# Patient Record
Sex: Female | Born: 1980 | Race: White | Hispanic: No | Marital: Married | Smoking: Never smoker
Health system: Southern US, Community
[De-identification: ages and names within clinical notes are randomized; demographics above are authoritative.]

## PROBLEM LIST (undated history)

## (undated) DIAGNOSIS — R112 Nausea with vomiting, unspecified: Secondary | ICD-10-CM

## (undated) DIAGNOSIS — E669 Obesity, unspecified: Secondary | ICD-10-CM

## (undated) DIAGNOSIS — Z8489 Family history of other specified conditions: Secondary | ICD-10-CM

## (undated) DIAGNOSIS — E119 Type 2 diabetes mellitus without complications: Secondary | ICD-10-CM

## (undated) DIAGNOSIS — G5600 Carpal tunnel syndrome, unspecified upper limb: Secondary | ICD-10-CM

## (undated) DIAGNOSIS — R0600 Dyspnea, unspecified: Secondary | ICD-10-CM

## (undated) DIAGNOSIS — Z9889 Other specified postprocedural states: Secondary | ICD-10-CM

## (undated) DIAGNOSIS — T8859XA Other complications of anesthesia, initial encounter: Secondary | ICD-10-CM

## (undated) DIAGNOSIS — M549 Dorsalgia, unspecified: Secondary | ICD-10-CM

## (undated) DIAGNOSIS — I071 Rheumatic tricuspid insufficiency: Secondary | ICD-10-CM

## (undated) DIAGNOSIS — T4145XA Adverse effect of unspecified anesthetic, initial encounter: Secondary | ICD-10-CM

## (undated) HISTORY — PX: DILATION AND CURETTAGE OF UTERUS: SHX78

---

## 2000-02-12 ENCOUNTER — Emergency Department (HOSPITAL_COMMUNITY): Admission: EM | Admit: 2000-02-12 | Discharge: 2000-02-12 | Payer: Self-pay | Admitting: Emergency Medicine

## 2001-10-03 ENCOUNTER — Emergency Department (HOSPITAL_COMMUNITY): Admission: EM | Admit: 2001-10-03 | Discharge: 2001-10-04 | Payer: Self-pay | Admitting: *Deleted

## 2003-09-18 ENCOUNTER — Other Ambulatory Visit: Admission: RE | Admit: 2003-09-18 | Discharge: 2003-09-18 | Payer: Self-pay | Admitting: Family Medicine

## 2004-01-21 ENCOUNTER — Ambulatory Visit (HOSPITAL_COMMUNITY): Admission: RE | Admit: 2004-01-21 | Discharge: 2004-01-21 | Payer: Self-pay | Admitting: Family Medicine

## 2005-11-24 ENCOUNTER — Other Ambulatory Visit: Admission: RE | Admit: 2005-11-24 | Discharge: 2005-11-24 | Payer: Self-pay | Admitting: Family Medicine

## 2006-01-31 ENCOUNTER — Emergency Department (HOSPITAL_COMMUNITY): Admission: EM | Admit: 2006-01-31 | Discharge: 2006-01-31 | Payer: Self-pay | Admitting: Emergency Medicine

## 2007-01-22 ENCOUNTER — Other Ambulatory Visit: Admission: RE | Admit: 2007-01-22 | Discharge: 2007-01-22 | Payer: Self-pay | Admitting: Obstetrics and Gynecology

## 2007-03-08 ENCOUNTER — Ambulatory Visit (HOSPITAL_COMMUNITY): Admission: RE | Admit: 2007-03-08 | Discharge: 2007-03-08 | Payer: Self-pay | Admitting: Obstetrics and Gynecology

## 2007-03-08 ENCOUNTER — Encounter (INDEPENDENT_AMBULATORY_CARE_PROVIDER_SITE_OTHER): Payer: Self-pay | Admitting: Obstetrics and Gynecology

## 2007-09-17 ENCOUNTER — Encounter (INDEPENDENT_AMBULATORY_CARE_PROVIDER_SITE_OTHER): Payer: Self-pay | Admitting: Obstetrics and Gynecology

## 2007-09-17 ENCOUNTER — Ambulatory Visit (HOSPITAL_COMMUNITY): Admission: RE | Admit: 2007-09-17 | Discharge: 2007-09-17 | Payer: Self-pay | Admitting: Obstetrics and Gynecology

## 2008-01-25 ENCOUNTER — Other Ambulatory Visit: Admission: RE | Admit: 2008-01-25 | Discharge: 2008-01-25 | Payer: Self-pay | Admitting: Obstetrics and Gynecology

## 2008-02-11 ENCOUNTER — Other Ambulatory Visit: Admission: RE | Admit: 2008-02-11 | Discharge: 2008-02-11 | Payer: Self-pay | Admitting: Obstetrics and Gynecology

## 2008-03-27 ENCOUNTER — Ambulatory Visit (HOSPITAL_COMMUNITY): Admission: RE | Admit: 2008-03-27 | Discharge: 2008-03-27 | Payer: Self-pay | Admitting: Obstetrics and Gynecology

## 2008-03-27 ENCOUNTER — Encounter (INDEPENDENT_AMBULATORY_CARE_PROVIDER_SITE_OTHER): Payer: Self-pay | Admitting: Obstetrics and Gynecology

## 2008-04-05 ENCOUNTER — Emergency Department (HOSPITAL_COMMUNITY): Admission: EM | Admit: 2008-04-05 | Discharge: 2008-04-06 | Payer: Self-pay | Admitting: Emergency Medicine

## 2008-08-07 ENCOUNTER — Encounter (INDEPENDENT_AMBULATORY_CARE_PROVIDER_SITE_OTHER): Payer: Self-pay | Admitting: Obstetrics and Gynecology

## 2008-08-07 ENCOUNTER — Ambulatory Visit (HOSPITAL_COMMUNITY): Admission: RE | Admit: 2008-08-07 | Discharge: 2008-08-07 | Payer: Self-pay | Admitting: Obstetrics and Gynecology

## 2009-06-30 ENCOUNTER — Other Ambulatory Visit: Admission: RE | Admit: 2009-06-30 | Discharge: 2009-06-30 | Payer: Self-pay | Admitting: Obstetrics and Gynecology

## 2010-04-19 ENCOUNTER — Inpatient Hospital Stay (HOSPITAL_COMMUNITY)
Admission: AD | Admit: 2010-04-19 | Discharge: 2010-04-19 | Payer: Self-pay | Source: Home / Self Care | Attending: Obstetrics and Gynecology | Admitting: Obstetrics and Gynecology

## 2010-04-19 DIAGNOSIS — N390 Urinary tract infection, site not specified: Secondary | ICD-10-CM

## 2010-04-19 DIAGNOSIS — R197 Diarrhea, unspecified: Secondary | ICD-10-CM

## 2010-04-19 DIAGNOSIS — O2390 Unspecified genitourinary tract infection in pregnancy, unspecified trimester: Secondary | ICD-10-CM

## 2010-04-19 DIAGNOSIS — O210 Mild hyperemesis gravidarum: Secondary | ICD-10-CM

## 2010-07-20 LAB — COMPREHENSIVE METABOLIC PANEL
ALT: 19 U/L (ref 0–35)
Alkaline Phosphatase: 41 U/L (ref 39–117)
CO2: 24 mEq/L (ref 19–32)
Glucose, Bld: 92 mg/dL (ref 70–99)
Potassium: 4.1 mEq/L (ref 3.5–5.1)
Sodium: 134 mEq/L — ABNORMAL LOW (ref 135–145)
Total Protein: 7 g/dL (ref 6.0–8.3)

## 2010-07-20 LAB — URINE MICROSCOPIC-ADD ON

## 2010-07-20 LAB — URINE CULTURE

## 2010-07-20 LAB — DIFFERENTIAL
Basophils Relative: 0 % (ref 0–1)
Eosinophils Absolute: 0 10*3/uL (ref 0.0–0.7)
Eosinophils Relative: 0 % (ref 0–5)
Monocytes Relative: 3 % (ref 3–12)
Neutrophils Relative %: 85 % — ABNORMAL HIGH (ref 43–77)

## 2010-07-20 LAB — CBC
HCT: 37.5 % (ref 36.0–46.0)
Hemoglobin: 12.7 g/dL (ref 12.0–15.0)
WBC: 8.9 10*3/uL (ref 4.0–10.5)

## 2010-07-20 LAB — URINALYSIS, ROUTINE W REFLEX MICROSCOPIC
Bilirubin Urine: NEGATIVE
Glucose, UA: NEGATIVE mg/dL
Ketones, ur: NEGATIVE mg/dL
pH: 6 (ref 5.0–8.0)

## 2010-08-10 ENCOUNTER — Encounter: Payer: Federal, State, Local not specified - PPO | Attending: Obstetrics and Gynecology | Admitting: Dietician

## 2010-08-10 DIAGNOSIS — O99810 Abnormal glucose complicating pregnancy: Secondary | ICD-10-CM | POA: Insufficient documentation

## 2010-08-10 DIAGNOSIS — Z713 Dietary counseling and surveillance: Secondary | ICD-10-CM | POA: Insufficient documentation

## 2010-08-18 LAB — CBC
HCT: 41.6 % (ref 36.0–46.0)
Hemoglobin: 13.9 g/dL (ref 12.0–15.0)
RBC: 4.69 MIL/uL (ref 3.87–5.11)
RDW: 13 % (ref 11.5–15.5)
WBC: 10 10*3/uL (ref 4.0–10.5)

## 2010-09-21 NOTE — H&P (Signed)
NAMEEGYPT, [name]                ACCOUNT NO.:  [id number]   MEDICAL RECORD NO.:  [id number]          PATIENT TYPE:  AMB   LOCATION:  SDC                           [facility]  [facility]   PHYSICIAN:  Charles A. Delcambre, MDDATE OF BIRTH:  [birth year]   DATE OF ADMISSION:  DATE OF DISCHARGE:                              HISTORY & PHYSICAL   HISTORY:  The patient to be admitted on Sep 17, 2007.  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  She has been taking cyclic progesterone,  Provera 10 mg.  She accepts risks of uterine perforation, failure of  procedure, and fluid overload.  All questions were answered.  She will  be made n.p.o. past midnight and will be admitted for surgery on Sep 17 2007.   PAST MEDICAL HISTORY:  Depression, not on chronic medications.   MEDICATIONS:  None other than Provera recently.   ALLERGIES:  No known drug allergies.   SURGICAL HISTORY:  Hysteroscopy and D&C.   SOCIAL HISTORY:  No tobacco or abnormal drug use.  She is not sexually  active.  She is not in a relationship at this time.   FAMILY HISTORY:  Diabetes, stroke, breast cancer otherwise not  specified.  Hypertension is present.   REVIEW OF SYSTEMS:  No chest pain, shortness of breath, wheezing,  diarrhea, constipation, melena, pelvic pain, fever, chills or headache.   PHYSICAL EXAM:  GENERAL:  Alert and oriented x3.  Blood pressure 112/88,  heart rate 60, respirations 20, and afebrile.  HEART:  Regular rate and rhythm without murmurs, rubs or gallops.  LUNGS:  Clear bilaterally.  ABDOMEN: Grossly obese.  PELVIC EXAM:  Normal external female genitalia. Bartholin, urethra,  Skene within normal limit.  Vault without discharge or lesions.  Nulliparous cervix, no cervical motion tenderness.  Uterus is  nonenlarged otherwise difficult to palpate secondary to the  patient's  body habitus.  Ovaries are not palpated secondary to the patient's body  habitus.   ASSESSMENT:  Complex hyperplasia, now 6 months on progesterone therapy  with irregular bleeding still, to be admitted.   PLAN:  Admit and hysteroscopy and dilation and curettage once again.  Informed consent is given as noted above, n.p.o. past midnight, and we  will proceed as outlined.  Preoperative CBC and urine pregnancy test was  clear.      Charles A. Sydnee Cabal, MD  Electronically Signed     CAD/MEDQ  D:  09/14/2007  T:  09/15/2007  Job:  [id number]
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
## 2010-09-21 NOTE — Op Note (Signed)
[name], [name]                ACCOUNT NO.:  [id number]   MEDICAL RECORD NO.:  [id number]          PATIENT TYPE:  AMB   LOCATION:  SDC                           [facility]   PHYSICIAN:  Charles A. Delcambre, MDDATE OF BIRTH:  [birth year]   DATE OF PROCEDURE:  03/27/2008  DATE OF DISCHARGE:                               OPERATIVE REPORT   PREOPERATIVE DIAGNOSIS:  Endometrial hyperplasia, complex.   POSTOPERATIVE DIAGNOSIS:  Endometrial hyperplasia, complex.   PROCEDURES:  1. Hysteroscopy.  2. Dilation and curettage.  3. Paracervical block.   SURGEON:  Charles A. Delcambre, MD   ASSISTANT:  None.   ANESTHESIA:  General by laryngeal.   FINDINGS:  Shaggy appearing endometrium.  No obvious polyps.   SPECIMEN:  Endometrial curettings to pathology.   ESTIMATED BLOOD LOSS:  20 mL.   COMPLICATIONS:  None.   SORBITOL LOSS:  50 mL.   COUNTS:  Instrument, sponge, and needle count correct x2.   DESCRIPTION OF PROCEDURE:  The patient was taken to the operating room,  placed in supine position, given anesthetic.  She was then placed in  dorsal lithotomy position.  Sterile prep and drape was undertaken.  Tenaculum was placed on the anterior lip of the cervix.  Sound was to 8  cm.  Hanks dilators were used to dilate enough to pass a 5-mm scope.  Scope was placed.  Clear visualization was noted with findings as noted  above.  Scope was withdrawn and generalized curetting in a  circumferential manner was undertaken yielding moderate amount of  tissue.  There  was no evidence of perforation.  Paracervical block had been placed at 4  and 8 o'clock with 0.25% plain Marcaine 10 mL each site.  Prior to  dilation, she was awakened and taken to recovery with physician in  attendance having tolerated the procedure well.      Charles A. Sydnee Cabal, MD  Electronically Signed     CAD/MEDQ  D:  03/27/2008  T:  03/27/2008  Job:  [id number]

## 2010-09-21 NOTE — Op Note (Signed)
[name], [name]                ACCOUNT NO.:  [id number]   MEDICAL RECORD NO.:  [id number]          PATIENT TYPE:  AMB   LOCATION:  SDC                           [facility]   PHYSICIAN:  Charles A. Delcambre, MDDATE OF BIRTH:  October 08, 1980   DATE OF PROCEDURE:  08/07/2008  DATE OF DISCHARGE:                               OPERATIVE REPORT   PREOPERATIVE DIAGNOSIS:  Complex hyperplasia of the endometrium.   POSTOPERATIVE DIAGNOSIS:  Complex hyperplasia of the endometrium.   PROCEDURES:  1. Dilation and curettage.  2. Paracervical block.   SURGEON:  Charles A. Delcambre, MD   ASSISTANT:  None.   COMPLICATIONS:  None.   ESTIMATED BLOOD LOSS:  Less than 10 mL.   ANESTHESIA:  General by LMA.   FINDINGS:  Sound to 9 cm, moderate amount of tissue with curetting.  Endometrial curettings to Pathology.  Instrument, sponge, and needle  counts correct x2.   DESCRIPTION OF PROCEDURE:  The patient was taken to the operating room,  placed in supine position.  She was given anesthetic, placed in dorsal  lithotomy position.  Sterile prep and drape was undertaken.  Tenaculum  was placed on the anterior cervix.  Sound was obtained.  Hanks dilators  were used to dilate enough to pass a small banjo curette.  Circumferential curettings methodically were done.  Polyp forceps did  not retrieve any tissue.  Hemostasis was adequate.  The patient  tolerated the procedure well, was hemostatic upon taking all instruments  out and the tenaculum off.  She was taken to recovery with physician in  attendance.      Charles A. Sydnee Cabal, MD  Electronically Signed     CAD/MEDQ  D:  08/07/2008  T:  08/07/2008  Job:  [id number]

## 2010-09-21 NOTE — Op Note (Signed)
[name], [name]                ACCOUNT NO.:  [id number]   MEDICAL RECORD NO.:  [id number]          PATIENT TYPE:  AMB   LOCATION:  SDC                           [facility]   PHYSICIAN:  Charles A. Delcambre, MDDATE OF BIRTH:  12-01-1980   DATE OF PROCEDURE:  03/08/2007  DATE OF DISCHARGE:                               OPERATIVE REPORT   PREOPERATIVE DIAGNOSIS:  Amenorrhea, menorrhagia, and endometrial  polyps.   POSTOPERATIVE DIAGNOSIS:  Amenorrhea, menorrhagia, and endometrial  polyps.   PROCEDURE:  1. Paracervical block.  2. Hysteroscopy.  3. Dilation and curettage.  4. Polypectomy.   SURGEON:  Charles A. Sydnee Cabal, M.D.   ASSISTANT:  None.   COMPLICATIONS:  None.   ESTIMATED BLOOD LOSS:  Less than 25 mL.   COUNTS:  Instrument, sponge, and needle counts correct x2.   SPECIMENS:  Multiple endometrial polyps and endometrial curettings to  pathology.   FINDINGS:  Multiple polyps everywhere in the cavity, proliferative  endometrium posteriorly distally.   DESCRIPTION OF PROCEDURE:  The patient was taken to the operating room  and placed in the supine position.  Anesthesia was dosed and she was in  the dorsal lithotomy position, at that point, in universal stirrups.  A  weighted speculum was placed in the vagina.  The tenaculum was used on  the anterior cervix.  A paracervical block was placed at 4 and 8 o'clock  with 20 mL divided 0.25% plain Marcaine.  There was no evidence of  intravascular location of injection.  Hanks dilators were used after the  sounding to dilate up enough to pass the small hysteroscope.  Visualization was excellent.  Polyp forceps were used to remove the  majority of the polyps and the hysteroscope was replaced. Two polyps  remained.  Generalized curettage was done and hysteroscopy was done once  again and all polyps had been removed.  A sharp curettage was done  circumferentially.  There was no evidence of perforation during the  procedure.  All the instruments were removed.  Hemostasis was adequate  of the tenaculum site.  The patient was awakened and taken to recovery  with the physician in attendance.      Charles A. Sydnee Cabal, MD  Electronically Signed     CAD/MEDQ  D:  03/08/2007  T:  03/08/2007  Job:  [id number]

## 2010-09-21 NOTE — Op Note (Signed)
[name], [name]                ACCOUNT NO.:  [id number]   MEDICAL RECORD NO.:  [id number]          PATIENT TYPE:  AMB   LOCATION:  SDC                           [facility]   PHYSICIAN:  Charles A. Delcambre, MDDATE OF BIRTH:  05/21/1980   DATE OF PROCEDURE:  09/17/2007  DATE OF DISCHARGE:                               OPERATIVE REPORT   PREOPERATIVE DIAGNOSES:  1. Endometrial complex hyperplasia.  2. Endometrial polyp with complex hyperplasia.   POSTOPERATIVE DIAGNOSES:  A proliferative-appearing endometrium and  small endometrial polyp.   PROCEDURES:  1. Hysteroscopy.  2. Dilation and curettage.  3. Polypectomy.  4. Paracervical block.   SURGEON:  Charles A. Delcambre, MD   ASSISTANT:  None.   COMPLICATIONS:  None.   ESTIMATED BLOOD LOSS:  Less than 10 mL.   ANESTHESIA:  General by LMA.   FINDINGS:  Proliferative-appearing endometrium, small fundal polyp.   SPECIMEN:  Endometrial polyp, endometrial curettings to pathology.   Sorbitol loss 40 mL.  Instrument and sponge count were correct x2.   DESCRIPTION OF PROCEDURE:  The patient was taken to the operating room,  placed in supine position and general anesthetic was induced without  difficulty.  She was then placed in dorsal lithotomy position in  Universal stirrups.  Sterile prep and drape was undertaken.  Weighted  speculum was placed in the vagina and tenaculum was used on the anterior  cervix.  Paracervical block, 0.25% plain Marcaine was placed, and a  total of 20 mL divided equally at 4 and 8 o'clock.  Sound was to 9 cm.  Hanks dilators were used to dilate, enough to pass the 5 mm  hysteroscope.  The scope was placed.  Findings are noted above.  Polypectomy was done with polyp forceps.  Generalized therapeutic  curettage was done to a point of grittiness circumferentially.  Polyp  curettings were sent to  path as well as the endometrial polyp.  Hemostasis was adequate and  tenaculum was removed.   Hemostasis was adequate.  The patient was  awakened and taken to recovery room with physician in attendance having  tolerated the procedure well.      Charles A. Sydnee Cabal, MD  Electronically Signed     CAD/MEDQ  D:  09/17/2007  T:  09/17/2007  Job:  [id number]

## 2010-09-21 NOTE — H&P (Signed)
NAMEJERALDIN, FESLER                ACCOUNT NO.:  [id number]   MEDICAL RECORD NO.:  [id number]          PATIENT TYPE:  AMB   LOCATION:  SDC                           [facility]   PHYSICIAN:  Charles A. Delcambre, MDDATE OF BIRTH:  01/22/1981   DATE OF ADMISSION:  DATE OF DISCHARGE:                              HISTORY & PHYSICAL   HISTORY OF PRESENT ILLNESS:  This patient is a 30 year old gravida 0,  para 0 with history of abnormal uterine bleeding and noted D and C  hysteroscopy performed on November 2009, complex hyperplasia, complex  endometrial hyperplasia was noted with a complex and hyperplastic polyp.  She has been on 20 mg of progesterone daily since that time.  She  accepts the risks of uterine perforation,  failed procedure, fluid  overload, bleeding, and hepatitis C and HIV exposure with blood  products.  All questions were answered.  The n.p.o. has been not eating  prior to the surgery scheduled for March 27, 2008.   PAST MEDICAL HISTORY:  Depression, not on chronic medications.   MEDICATIONS:  Provera 20 mg daily.   ALLERGIES:  No known drug allergies.   PAST SURGICAL HISTORY:  Hysteroscopy, D and C.   SOCIAL HISTORY:  No tobacco, ethanol, or abnormal drug use.  She is not  sexually active.  She is not in any relationship.   FAMILY HISTORY:  Diabetes, stroke, breast cancer, otherwise negative  family history.  Hypertension is present __________.   REVIEW OF SYSTEMS:  No chest pain, shortness of breath, wheezing,  diarrhea, constipation, melena or pelvic pain, fever, chills, or  headaches.   PHYSICAL EXAMINATION:  GENERAL:  Alert and oriented x3.  VITAL SIGNS:  Blood pressure 128/84, heart rate 60, respirations 18, and  weight 280 pounds.  HEART:  Regular rate and rhythm.  LUNGS:  Clear bilaterally.  ABDOMEN:  Obese and nontender.  PELVIC:  Normal external female genitalia.  Bartholin, urethra, Skene  within normal limit.  Vagina without discharge  currently.  Cervix  nulliparous, uterus not enlarged and nontender.  Ovaries are not  palpable secondary to obese status.   ASSESSMENT:  Complex hyperplasia and obesity.   PLAN:  Hysteroscopy, D and C, preop CBC, pregnancy test, urine culture  n.p.o. past night midnight the evening prior to surgery.      Charles A. Sydnee Cabal, MD  Electronically Signed     CAD/MEDQ  D:  03/20/2008  T:  03/20/2008  Job:  [id number]

## 2010-09-26 ENCOUNTER — Inpatient Hospital Stay (HOSPITAL_COMMUNITY)
Admission: AD | Admit: 2010-09-26 | Discharge: 2010-09-26 | Disposition: A | Discharge status: Home / Self Care | Payer: Federal, State, Local not specified - PPO | Source: Ambulatory Visit | Attending: Obstetrics and Gynecology | Admitting: Obstetrics and Gynecology

## 2010-09-26 DIAGNOSIS — O99891 Other specified diseases and conditions complicating pregnancy: Secondary | ICD-10-CM | POA: Insufficient documentation

## 2010-09-26 DIAGNOSIS — R42 Dizziness and giddiness: Secondary | ICD-10-CM

## 2010-09-26 DIAGNOSIS — O9989 Other specified diseases and conditions complicating pregnancy, childbirth and the puerperium: Secondary | ICD-10-CM

## 2010-09-26 LAB — CBC
HCT: 36.8 % (ref 36.0–46.0)
Hemoglobin: 11.9 g/dL — ABNORMAL LOW (ref 12.0–15.0)
RBC: 4.24 MIL/uL (ref 3.87–5.11)
WBC: 9.8 10*3/uL (ref 4.0–10.5)

## 2010-09-26 LAB — URINALYSIS, ROUTINE W REFLEX MICROSCOPIC
Bilirubin Urine: NEGATIVE
Glucose, UA: NEGATIVE mg/dL
Hgb urine dipstick: NEGATIVE
Ketones, ur: NEGATIVE mg/dL
Nitrite: NEGATIVE
Protein, ur: NEGATIVE mg/dL
Specific Gravity, Urine: 1.025 (ref 1.005–1.030)
Urobilinogen, UA: 0.2 mg/dL (ref 0.0–1.0)
pH: 6.5 (ref 5.0–8.0)

## 2010-09-26 LAB — COMPREHENSIVE METABOLIC PANEL WITH GFR
ALT: 9 U/L (ref 0–35)
AST: 11 U/L (ref 0–37)
Albumin: 2.3 g/dL — ABNORMAL LOW (ref 3.5–5.2)
Alkaline Phosphatase: 89 U/L (ref 39–117)
BUN: 5 mg/dL — ABNORMAL LOW (ref 6–23)
CO2: 24 meq/L (ref 19–32)
Calcium: 9.2 mg/dL (ref 8.4–10.5)
Chloride: 102 meq/L (ref 96–112)
Creatinine, Ser: 0.5 mg/dL (ref 0.4–1.2)
GFR calc non Af Amer: >60 mL/min
Glucose, Bld: 98 mg/dL (ref 70–99)
Potassium: 3.6 meq/L (ref 3.5–5.1)
Sodium: 135 meq/L (ref 135–145)
Total Bilirubin: 0.1 mg/dL — ABNORMAL LOW (ref 0.3–1.2)
Total Protein: 6 g/dL (ref 6.0–8.3)

## 2010-09-26 LAB — URIC ACID: Uric Acid, Serum: 3.6 mg/dL (ref 2.4–7.0)

## 2010-09-26 LAB — GLUCOSE, CAPILLARY: Glucose-Capillary: 99 mg/dL (ref 70–99)

## 2010-09-26 LAB — LACTATE DEHYDROGENASE: LDH: 181 U/L (ref 94–250)

## 2010-09-27 ENCOUNTER — Encounter (HOSPITAL_COMMUNITY): Payer: Federal, State, Local not specified - PPO

## 2010-09-27 ENCOUNTER — Other Ambulatory Visit: Payer: Self-pay | Admitting: Obstetrics and Gynecology

## 2010-09-27 ENCOUNTER — Other Ambulatory Visit (HOSPITAL_COMMUNITY): Payer: Federal, State, Local not specified - PPO

## 2010-09-27 LAB — BASIC METABOLIC PANEL
BUN: 4 mg/dL — ABNORMAL LOW (ref 6–23)
CO2: 22 mEq/L (ref 19–32)
Calcium: 9.1 mg/dL (ref 8.4–10.5)
Chloride: 101 mEq/L (ref 96–112)
Creatinine, Ser: 0.5 mg/dL (ref 0.4–1.2)
GFR calc Af Amer: >60 mL/min (ref >60)

## 2010-09-27 LAB — SURGICAL PCR SCREEN

## 2010-09-27 LAB — CBC
Hemoglobin: 12.3 g/dL (ref 12.0–15.0)
MCH: 28.3 pg (ref 26.0–34.0)
MCHC: 32.6 g/dL (ref 30.0–36.0)
MCV: 86.9 fL (ref 78.0–100.0)
Platelets: 282 10*3/uL (ref 150–400)

## 2010-09-28 ENCOUNTER — Other Ambulatory Visit: Payer: Self-pay | Admitting: Obstetrics and Gynecology

## 2010-09-28 ENCOUNTER — Inpatient Hospital Stay (HOSPITAL_COMMUNITY)
Admission: RE | Admit: 2010-09-28 | Discharge: 2010-10-01 | DRG: 371 | Disposition: A | Discharge status: Home / Self Care | Payer: Federal, State, Local not specified - PPO | Source: Ambulatory Visit | Attending: Obstetrics and Gynecology | Admitting: Obstetrics and Gynecology

## 2010-09-28 DIAGNOSIS — O3660X0 Maternal care for excessive fetal growth, unspecified trimester, not applicable or unspecified: Secondary | ICD-10-CM | POA: Diagnosis present

## 2010-09-28 DIAGNOSIS — O321XX0 Maternal care for breech presentation, not applicable or unspecified: Principal | ICD-10-CM | POA: Diagnosis present

## 2010-09-28 DIAGNOSIS — O99814 Abnormal glucose complicating childbirth: Secondary | ICD-10-CM | POA: Diagnosis present

## 2010-09-28 DIAGNOSIS — O1002 Pre-existing essential hypertension complicating childbirth: Secondary | ICD-10-CM | POA: Diagnosis present

## 2010-09-29 LAB — CBC
HCT: 31.9 % — ABNORMAL LOW (ref 36.0–46.0)
Hemoglobin: 10.2 g/dL — ABNORMAL LOW (ref 12.0–15.0)
MCH: 27.9 pg (ref 26.0–34.0)
MCV: 87.4 fL (ref 78.0–100.0)
Platelets: 293 10*3/uL (ref 150–400)
RBC: 3.65 MIL/uL — ABNORMAL LOW (ref 3.87–5.11)
WBC: 11.9 10*3/uL — ABNORMAL HIGH (ref 4.0–10.5)

## 2010-09-29 LAB — GLUCOSE, CAPILLARY: Glucose-Capillary: 112 mg/dL — ABNORMAL HIGH (ref 70–99)

## 2010-09-30 ENCOUNTER — Inpatient Hospital Stay (HOSPITAL_COMMUNITY): Payer: Federal, State, Local not specified - PPO

## 2010-09-30 LAB — COMPREHENSIVE METABOLIC PANEL
ALT: 18 U/L (ref 0–35)
BUN: 5 mg/dL — ABNORMAL LOW (ref 6–23)
Calcium: 9.1 mg/dL (ref 8.4–10.5)
Glucose, Bld: 130 mg/dL — ABNORMAL HIGH (ref 70–99)
Sodium: 136 mEq/L (ref 135–145)
Total Protein: 6.2 g/dL (ref 6.0–8.3)

## 2010-09-30 LAB — BLOOD GAS, ARTERIAL
Drawn by: 24517
FIO2: 0.21 %
pH, Arterial: 7.482 — ABNORMAL HIGH (ref 7.350–7.400)

## 2010-09-30 LAB — GLUCOSE, CAPILLARY
Glucose-Capillary: 120 mg/dL — ABNORMAL HIGH (ref 70–99)
Glucose-Capillary: 153 mg/dL — ABNORMAL HIGH (ref 70–99)

## 2010-09-30 LAB — MRSA CULTURE

## 2010-09-30 LAB — CBC
HCT: 32.1 % — ABNORMAL LOW (ref 36.0–46.0)
MCHC: 32.4 g/dL (ref 30.0–36.0)
RDW: 14.6 % (ref 11.5–15.5)

## 2010-09-30 LAB — URIC ACID: Uric Acid, Serum: 3.9 mg/dL (ref 2.4–7.0)

## 2010-09-30 LAB — LACTATE DEHYDROGENASE: LDH: 262 U/L — ABNORMAL HIGH (ref 94–250)

## 2010-10-01 ENCOUNTER — Other Ambulatory Visit (HOSPITAL_COMMUNITY): Payer: Federal, State, Local not specified - PPO

## 2010-10-11 NOTE — Op Note (Signed)
NAMEWENDIE, [name]                ACCOUNT NO.:  [id number]  MEDICAL RECORD NO.:  [id number]           PATIENT TYPE:  I  LOCATION:  9118                          [facility]ACILITY:  [facility]  PHYSICIAN:  Dineen Kid. Rana Snare, M.D.    DATE OF BIRTH:  Dec 02, 1980  DATE OF PROCEDURE:  09/28/2010 DATE OF DISCHARGE:                              OPERATIVE REPORT   PREOPERATIVE DIAGNOSES:  Intrauterine pregnancy at 37-1/[redacted] weeks gestational age, gestational diabetes requiring glyburide with poor control mature amniocentesis, breech presentation, and macrosomia.  POSTOPERATIVE DIAGNOSES:  Intrauterine pregnancy at 37-1/[redacted] weeks gestational age, gestational diabetes requiring glyburide with poor control mature amniocentesis, breech presentation, and macrosomia.  PROCEDURE:  Primary low segment transverse cesarean section.  SURGEON:  Dineen Kid. Rana Snare, MD  ASSISTANT:  Stann Mainland. Vincente Poli, MD  ANESTHESIA:  Spinal.  INDICATIONS:  [name] is a 30 year old G1 with gestational diabetes requiring glyburide with worsening control of the diabetes now with macrosomia with estimated fetal weight of 12-1/2 pounds at 37 weeks, underwent anesthesia showing mature amnio with LS of 4:1 with PG and breech presentation.  Plan to proceed with primary low segment transverse cesarean section.  Risks and benefits were discussed at length.  Informed consent was obtained.  FINDINGS AT THE TIME OF SURGERY:  Viable female infant, Apgars were 8 and 9, weight 11 pounds 5 ounces, pH arterial 7.30.  DESCRIPTION OF PROCEDURE:  After adequate analgesia, the patient was placed in the supine position with left lateral tilt.  She was sterilely prepped and draped.  Bladder was sterilely drained with a Foley catheter.  A Pfannenstiel skin incision was made two fingerbreadths above the pubic symphysis, taken down sharply to fascia, which was incised transversely, extended superiorly and inferiorly off the bellies of rectus muscle, which  were separated sharply in midline.  Peritoneum was entered sharply.  Bladder flap created and placed behind the bladder blade, and Alexis retractor was also placed.  A low segment myotomy incision was made down to the amniotic sac, extended laterally  with the operator's fingertips.  Buttocks was delivered, arms easily reduced and the head delivered with nuchal cord x1 noted.  Nares and pharynx were then suctioned, infant handed to the pediatricians and cord clamped, cut, handed to the pediatricians for resuscitation.  Cord blood was obtained.  Placenta extracted manually.  The uterus was exteriorized, wiped clean with a dry lap.  The myotomy incision was closed in two layers, first being running locking layer, second being imbricating layer with zero Monocryl suture.  Uterus placed back in the abdominal cavity after copious amount of irrigation, adequate hemostasis. Peritoneum was closed with 0 Monocryl suture.  Rectus muscle plicated in midline.  Irrigation applied and after adequate hemostasis, the fascia was closed with #1 PDS in running fashion.  The Camper fascia was then reapproximated with 2-0 plain suture. Skin stapled, Steri-Strips applied.  The patient tolerated the procedure well, was stable on transfer to recovery room.  Sponge and instrument counts were normal x3.  Estimated blood loss 400 mL.  The patient received cefepime 1 g preoperatively.     Dineen Kid Rana Snare,  M.D.     DCL/MEDQ  D:  09/28/2010  T:  09/28/2010  Job:  [id number]  Electronically Signed by Candice Camp M.D. on 10/11/2010 10:45:11 AM

## 2010-10-11 NOTE — H&P (Signed)
  NAMEDOLL, FRAZEE                ACCOUNT NO.:  [id number]  MEDICAL RECORD NO.:  [id number]           PATIENT TYPE:  I  LOCATION:  9118                          [facility]ILITY:  [facility]  PHYSICIAN:  Dineen Kid. Rana Snare, M.D.    DATE OF BIRTH:  [birth year]  DATE OF ADMISSION:  09/28/2010 DATE OF DISCHARGE:                             HISTORY & PHYSICAL   HISTORY OF PRESENT ILLNESS:  Ms. [name] is a 30 year old G1 at 37-1/[redacted] weeks gestational age with an [facility] of October 15, 2010, presents for primary cesarean section.  Pregnancy has been complicated by diabetes, requiring glyburide now with poor control with extreme macrosomic infant in breech presentation.  Recent ultrasound shows 12.5-pound fetus.  She underwent amniocentesis showing a mature LS 4:1 ratio with PG.  She presents for primary cesarean section.  PAST MEDICAL HISTORY:  Significant for bipolar disorder and also has significant for complex hyperplasia of the uterus, and also infertility.  PAST SURGICAL HISTORY:  Negative.  MEDICATIONS:  Glyburide.  ALLERGIES:  She is an allergy to LATEX.  PHYSICAL EXAMINATION:  VITAL SIGNS:  Her blood pressure is 138/70. HEART:  Regular rate and rhythm. LUNGS:  Clear to auscultation bilaterally. ABDOMEN:  Morbidly obese. CERVIX:  Closed, thick and high.  IMPRESSION AND PLAN:  Intrauterine pregnancy at 37-1/[redacted] weeks gestational age with medication requiring gestational diabetes with poor control and macrosomia and breech presentation.  PLAN:  Primary low-segment transverse cesarean section with mature amniocentesis.  Risks and benefits were discussed at length, which include but not limited to risk of infection, bleeding, and damage to the uterus, tubes, ovary, bowel, bladder, fetus, possibility that if she have issues for sugar or diabetes, may have present to intensive care nursing.  She does give her informed consent and wished to proceed.     Dineen Kid Rana Snare, M.D.    DCL/MEDQ  D:   09/28/2010  T:  09/28/2010  Job:  [id number]  Electronically Signed by Candice Camp M.D. on 10/11/2010 10:45:14 AM

## 2010-10-13 NOTE — Discharge Summary (Signed)
NAMEKRYSTALYN, [name]                ACCOUNT NO.:  [id number]  MEDICAL RECORD NO.:  [id number]           PATIENT TYPE:  I  LOCATION:  9118                          FACILITY:  [facility]  PHYSICIAN:  Zelphia Cairo, MD    DATE OF BIRTH:  01-22-1981  DATE OF ADMISSION:  09/28/2010 DATE OF DISCHARGE:  10/01/2010                              DISCHARGE SUMMARY   ADMITTING DIAGNOSES: 1. Intrauterine pregnancy at 37-1/2 weeks' estimated gestational age. 2. Gestational diabetes with poor control and fetal macrosomia. 3. Breech presentation.  DISCHARGE DIAGNOSES: 1. Status post low transverse cesarean section. 2. Viable female infant.  PROCEDURE:  Primary low transverse cesarean section.  REASON FOR ADMISSION:  Please see dictated H and P.  HOSPITAL COURSE:  The patient is a 30 year old primigravida who was admitted to [facility] at 37-1/2 weeks' estimated gestational age for cesarean delivery.  Her pregnancy had been complicated by gestational diabetes requiring glipizide with poor control and extreme macrosomic infant in the breech presentation.  The patient had undergone an ultrasound in the office that had revealed a fetus weighing 12-1/2 pounds.  The patient did undergo amniocentesis which did reveal on the true L/S ratio of 4.4:1 ratio with PG.  On the morning of admission, the patient was taken to the operating room where spinal anesthesia was administered without difficulty.  A low transverse incision was made with delivery of a viable female infant weighing 11 pounds and 5 ounces with Apgars of 8 at 1 minute and 9 at 5 minutes. Arterial cord pH of 7.30.  The patient tolerated the procedure well and taken to the recovery room in stable condition.  On postoperative day #1, the patient was without complaint.  Vital signs were stable.  Blood pressure 120/75.  Abdomen soft.  Fundus firm and nontender.  Scant amount of drainage noted on the bandage.  Foley had been  discontinued. She was voiding well.  Laboratory findings revealed hemoglobin of 10.2, platelet count of 293,000, and blood type is known to be A+.  Capillary blood sugar, fasting blood sugar was 123 mg/dL.  On postoperative day #2, the patient complained of some productive cough with shortness of breath.  Vital signs were stable.  Blood pressures were 128/86.  Deep tendon reflexes were 1+, no clonus, 1+ edema was noted through her knees.  Abdomen was soft.  Fundus firm and nontender.  Lungs were positive for rales in the right middle and lower lobes.  Incision was clean, dry, and intact.  Capillary blood sugars revealed fasting blood sugar of 97 mg/dL and postprandial 366-440.  Chest x-ray was ordered. Discontinuation of CBCs, pulse ox was 97%, and arterial blood gas was drawn and PIH labs drawn.  On the following morning, the patient continued to have a nonproductive cough.  Vital signs were stable. Blood pressure was 138/84 to 146/84.  Deep tendon reflexes were 1+.  No clonus.  2+ pedal edema was noted.  Abdomen was soft.  Fundus firm and nontender.  Incision was clean, dry, and intact with small amount of ecchymosis noted superior and inferior to the incisional site where some edema  noted on them, hence ER staples were left in place.  Laboratory findings showed hemoglobin stable at 10.4, platelet count of 267,000, and WBC count of 8.6.  Liver function tests revealed AST of 22 and ALT of 18.  Chest x-ray was within normal limits.  Discharge instructions were reviewed and the patient was later discharged home.  CONDITION ON DISCHARGE:  Stable.  DIET:  Regular as tolerated.  ACTIVITY:  No heavy lifting, no driving x2 weeks, and no vaginal entry.  FOLLOWUP:  The patient is to follow up in the office in 3-4 days for staple removal and recheck.  She is to call for temperature greater than 100 degrees, persistent nausea, vomiting, heavy vaginal bleeding, and/or redness or drainage from  the incisional site.  The patient was also instructed to call for headache or blurred vision or right upper quadrant pain.  DISCHARGE MEDICATIONS: 1. Percocet 5/325, #30, one p.o. q.4-6 h. 2. Motrin 600 mg every 6 hours. 3. Tessalon Perles 100 mg 1 p.o. t.i.d. 4. Prenatal vitamins 1 p.o. daily taking.     Julio Sicks, N.P.   ______________________________ Zelphia Cairo, MD    CC/MEDQ  D:  10/01/2010  T:  10/01/2010  Job:  [id number]  Electronically Signed by Julio Sicks N.P. on 10/05/2010 09:04:43 AM Electronically Signed by Zelphia Cairo MD on 10/13/2010 07:40:33 PM

## 2010-10-25 ENCOUNTER — Ambulatory Visit (HOSPITAL_COMMUNITY)
Admission: RE | Admit: 2010-10-25 | Discharge: 2010-10-25 | Disposition: A | Discharge status: Home / Self Care | Payer: Federal, State, Local not specified - PPO | Source: Ambulatory Visit | Attending: Obstetrics and Gynecology | Admitting: Obstetrics and Gynecology

## 2010-10-25 DIAGNOSIS — O925 Suppressed lactation: Secondary | ICD-10-CM | POA: Insufficient documentation

## 2011-02-08 LAB — URINALYSIS, ROUTINE W REFLEX MICROSCOPIC
Nitrite: NEGATIVE
Specific Gravity, Urine: 1.027
Urobilinogen, UA: 0.2

## 2011-02-08 LAB — POCT I-STAT, CHEM 8
BUN: 6
Calcium, Ion: 1.15
Chloride: 105
Creatinine, Ser: 0.9
Sodium: 143
TCO2: 27

## 2011-02-08 LAB — CBC
Platelets: 363
WBC: 10.6 — ABNORMAL HIGH

## 2011-02-08 LAB — HCG, SERUM, QUALITATIVE: Preg, Serum: NEGATIVE

## 2011-02-08 LAB — URINE MICROSCOPIC-ADD ON

## 2011-02-16 LAB — CBC
HCT: 39.6
MCHC: 33.9
MCV: 89.6
RBC: 4.42

## 2011-02-16 LAB — HCG, SERUM, QUALITATIVE: Preg, Serum: NEGATIVE

## 2013-01-21 ENCOUNTER — Ambulatory Visit: Payer: Self-pay | Admitting: Dietician

## 2014-03-29 ENCOUNTER — Emergency Department (HOSPITAL_COMMUNITY): Payer: Self-pay

## 2014-03-29 ENCOUNTER — Encounter (HOSPITAL_COMMUNITY): Payer: Self-pay | Admitting: Nurse Practitioner

## 2014-03-29 ENCOUNTER — Emergency Department (HOSPITAL_COMMUNITY)
Admission: EM | Admit: 2014-03-29 | Discharge: 2014-03-29 | Disposition: A | Discharge status: Home / Self Care | Payer: Self-pay | Attending: Emergency Medicine | Admitting: Emergency Medicine

## 2014-03-29 DIAGNOSIS — S6991XA Unspecified injury of right wrist, hand and finger(s), initial encounter: Secondary | ICD-10-CM | POA: Insufficient documentation

## 2014-03-29 DIAGNOSIS — Y998 Other external cause status: Secondary | ICD-10-CM | POA: Insufficient documentation

## 2014-03-29 DIAGNOSIS — E669 Obesity, unspecified: Secondary | ICD-10-CM | POA: Insufficient documentation

## 2014-03-29 DIAGNOSIS — M25531 Pain in right wrist: Secondary | ICD-10-CM

## 2014-03-29 DIAGNOSIS — M25562 Pain in left knee: Secondary | ICD-10-CM

## 2014-03-29 DIAGNOSIS — Y92512 Supermarket, store or market as the place of occurrence of the external cause: Secondary | ICD-10-CM | POA: Insufficient documentation

## 2014-03-29 DIAGNOSIS — W010XXA Fall on same level from slipping, tripping and stumbling without subsequent striking against object, initial encounter: Secondary | ICD-10-CM

## 2014-03-29 DIAGNOSIS — M545 Low back pain, unspecified: Secondary | ICD-10-CM

## 2014-03-29 DIAGNOSIS — Z7982 Long term (current) use of aspirin: Secondary | ICD-10-CM | POA: Insufficient documentation

## 2014-03-29 DIAGNOSIS — E119 Type 2 diabetes mellitus without complications: Secondary | ICD-10-CM | POA: Insufficient documentation

## 2014-03-29 DIAGNOSIS — S3992XA Unspecified injury of lower back, initial encounter: Secondary | ICD-10-CM | POA: Insufficient documentation

## 2014-03-29 DIAGNOSIS — Z91040 Latex allergy status: Secondary | ICD-10-CM | POA: Insufficient documentation

## 2014-03-29 DIAGNOSIS — Y9389 Activity, other specified: Secondary | ICD-10-CM | POA: Insufficient documentation

## 2014-03-29 DIAGNOSIS — Z792 Long term (current) use of antibiotics: Secondary | ICD-10-CM | POA: Insufficient documentation

## 2014-03-29 DIAGNOSIS — Z8669 Personal history of other diseases of the nervous system and sense organs: Secondary | ICD-10-CM | POA: Insufficient documentation

## 2014-03-29 HISTORY — DX: Obesity, unspecified: E66.9

## 2014-03-29 HISTORY — DX: Dorsalgia, unspecified: M54.9

## 2014-03-29 HISTORY — DX: Carpal tunnel syndrome, unspecified upper limb: G56.00

## 2014-03-29 HISTORY — DX: Type 2 diabetes mellitus without complications: E11.9

## 2014-03-29 LAB — POC URINE PREG, ED: Preg Test, Ur: NEGATIVE

## 2014-03-29 MED ORDER — DICLOXACILLIN SODIUM 500 MG PO CAPS: 500.0000 mg | ORAL_CAPSULE | Freq: Four times a day (QID) | ORAL | Status: DC

## 2014-03-29 MED ORDER — IBUPROFEN 400 MG PO TABS
600.0000 mg | ORAL_TABLET | Freq: Once | ORAL | Status: AC
  Administered 2014-03-29: 600 mg via ORAL
  Filled 2014-03-29 (×2): qty 1

## 2014-03-29 NOTE — ED Provider Notes (Signed)
CSN: [id number]     Arrival date & time 03/29/14  1653 History  This chart was scribed for non-physician practitioner working with No att. providers found by Mercy Moore, ED Scribe. This patient was seen in room TR06C/TR06C and the patient's care was started at 5:34 PM.   Chief Complaint  Patient presents with  . Fall   The history is provided by the patient. No language interpreter was used.   HPI Comments: [name] is a 33 y.o. female who presents to the Emergency Department with multiple injuries after slipping on a wet bathroom floor while at a store named [employer]. Patient denies head injury or loss of consciousness. Patient is now complaining of anterior left knee pain and swelling, right wrist pain and lower back pain. Patient reports catching herself with her right wrist; she is unsure if she landed on her left knee. Patient shares history of chronic back pain and carpal tunnel pain that has been exacerbated by her incident today. Patient reports returning home after the accident and treating her pain with elevation and ice; no pain medication. Patient reports decrease in her pain intensity since her fall stating that intially it was 10/10 but is now an 8/10. Patient denies numbness or tingling in her right hand and arm or pain in her elbow. Denies numbness or tingling in legs, no change in gait. No change in bowel or bladder habits.  Past Medical History  Diagnosis Date  . Diabetes mellitus without complication   . Carpal tunnel syndrome   . Back pain   . Obesity    Past Surgical History  Procedure Laterality Date  . Cesarean section     History reviewed. No pertinent family history. History  Substance Use Topics  . Smoking status: Never Smoker   . Smokeless tobacco: Not on file  . Alcohol Use: No   OB History    No data available     Review of Systems  Constitutional: Negative for fever and chills.  Genitourinary: Negative for hematuria.  Musculoskeletal: Positive  for myalgias, back pain and arthralgias.  Neurological: Negative for syncope, weakness and numbness.    Allergies  Latex  Home Medications   Prior to Admission medications   Medication Sig Start Date End Date Taking? Authorizing Provider  aspirin 81 MG tablet Take 81 mg by mouth daily.   Yes Historical Provider, MD  atorvastatin (LIPITOR) 10 MG tablet Take 10 mg by mouth daily. 03/01/14  Yes Historical Provider, MD  cyclobenzaprine (FLEXERIL) 10 MG tablet Take 10 mg by mouth 3 (three) times daily as needed for muscle spasms.   Yes Historical Provider, MD  losartan (COZAAR) 25 MG tablet Take 25 mg by mouth daily. 03/01/14  Yes Historical Provider, MD  naproxen (NAPROSYN) 500 MG tablet Take 500 mg by mouth as needed for mild pain.   Yes Historical Provider, MD  ONGLYZA 5 MG TABS tablet Take 5 mg by mouth daily. 03/03/14  Yes Historical Provider, MD  dicloxacillin (DYNAPEN) 500 MG capsule Take 1 capsule (500 mg total) by mouth 4 (four) times daily. 03/29/14   Noland Fordyce, PA-C  ENPRESSE-28 tablet Take 1 tablet by mouth daily. 02/27/14   Historical Provider, MD   Triage Vitals: BP 142/89 mmHg  Pulse 96  Temp(Src) 99.3 F (37.4 C) (Oral)  Resp 15  Ht 5\' 8"  (1.727 m)  Wt 280 lb (127.007 kg)  BMI 42.58 kg/m2  SpO2 96%  LMP 01/16/2014 Physical Exam  Constitutional: She is oriented to  person, place, and time. She appears well-developed and well-nourished. No distress.  HENT:  Head: Normocephalic and atraumatic.  Eyes: EOM are normal.  Neck: Neck supple. No tracheal deviation present.  Cardiovascular: Normal rate.   Pulses:      Radial pulses are 2+ on the right side.       Dorsalis pedis pulses are 2+ on the left side.  Pulmonary/Chest: Effort normal. No respiratory distress.  Musculoskeletal: Normal range of motion. She exhibits tenderness.  Lower back: tenderness along lower lumbar spine and right lumbar muscles.  FROM arms and legs. Normal gait. Mild edema over left patella  with tenderness. Full ROM of knee. No calf tenderness.  Right wrist with full ROM. No edema or deformity. Tenderness over dorsal aspect. 5/5 grip strength. Radial pulse 2+. Sensation intact.   Neurological: She is alert and oriented to person, place, and time.  Skin: Skin is warm and dry.  Skin intact no erythema or ecchymosis or left knee or right wrist.   Psychiatric: She has a normal mood and affect. Her behavior is normal.  Nursing note and vitals reviewed.   ED Course  Procedures (including critical care time)  COORDINATION OF CARE: 5:41 PM- Plans to obtain imaging. Discussed treatment plan with patient at bedside and patient agreed to plan.   Labs Review Labs Reviewed  POC URINE PREG, ED    Imaging Review Dg Lumbar Spine Complete  03/29/2014   CLINICAL DATA:  Low back pain after falling today. Initial encounter.  EXAM: LUMBAR SPINE - COMPLETE 4+ VIEW  COMPARISON:  None.  FINDINGS: There are 5 lumbar type vertebral bodies. The alignment is normal. There is mild disc space narrowing and intervertebral spurring at L5-S1. There is also mild intervertebral spurring at L3-4 and L4-5. No evidence of acute fracture or pars defect.  IMPRESSION: No acute osseous findings or malalignment. Early lower lumbar spondylosis.   Electronically Signed   By: Camie Patience M.D.   On: 03/29/2014 20:28   Dg Wrist Complete Right  03/29/2014   CLINICAL DATA:  Fall.  Right wrist pain.  Initial encounter.  EXAM: RIGHT WRIST - COMPLETE 3+ VIEW  COMPARISON:  None.  FINDINGS: There is no evidence of fracture or dislocation. There is no evidence of arthropathy or other focal bone abnormality. Soft tissues are unremarkable.  IMPRESSION: Negative.   Electronically Signed   By: Rolm Baptise M.D.   On: 03/29/2014 19:04   Dg Knee Complete 4 Views Left  03/29/2014   CLINICAL DATA:  33 year old female with fall, left knee injury and pain.  EXAM: LEFT KNEE - COMPLETE 4+ VIEW  COMPARISON:  None.  FINDINGS: There is no  evidence of fracture, subluxation or dislocation.  There is no evidence of joint effusion.  Minimal degenerative changes in the medial and patellofemoral compartments noted.  IMPRESSION: No evidence of acute abnormality.   Electronically Signed   By: Hassan Rowan M.D.   On: 03/29/2014 19:04     EKG Interpretation None      MDM   Final diagnoses:  Fall from slip, trip, or stumble, initial encounter  Left knee pain  Low back pain  Right wrist pain   Pt presenting to ED with c/o lower back pain, right wrist pain and left knee pain from fall. No focal neuro deficit. Denies hitting head or LOC.  Plain films: negative for acute injuries. Will tx conservatively with diclofenac. Home care instructions provided. Pt verbalized understanding and agreement with tx plan.  I personally performed the services described in this documentation, which was scribed in my presence. The recorded information has been reviewed and is accurate.    Noland Fordyce, PA-C 03/29/14 2125  Evelina Bucy, MD 03/29/14 2152

## 2014-03-29 NOTE — ED Notes (Signed)
She slipped and fell in wet bathroom floor and having L knee, R wrist and lower back pain. Reports history of chronic back pain and carpal tunnel pain in her wrist but pain is worse now

## 2014-09-25 ENCOUNTER — Other Ambulatory Visit: Payer: Self-pay | Admitting: Internal Medicine

## 2014-09-25 DIAGNOSIS — R0600 Dyspnea, unspecified: Secondary | ICD-10-CM

## 2014-09-26 ENCOUNTER — Ambulatory Visit (INDEPENDENT_AMBULATORY_CARE_PROVIDER_SITE_OTHER): Payer: Federal, State, Local not specified - PPO | Admitting: Internal Medicine

## 2014-09-26 DIAGNOSIS — R0600 Dyspnea, unspecified: Secondary | ICD-10-CM

## 2014-09-26 LAB — PULMONARY FUNCTION TEST
DL/VA % PRED: 141 %
DL/VA: 7.42 ml/min/mmHg/L
DLCO unc % pred: 103 %
DLCO unc: 30.55 ml/min/mmHg
FEF 25-75 Post: 2.57 L/sec
FEF 25-75 Pre: 2.44 L/sec
FEF2575-%CHANGE-POST: 5 %
FEF2575-%PRED-POST: 72 %
FEF2575-%Pred-Pre: 68 %
FEV1-%CHANGE-POST: 3 %
FEV1-%PRED-POST: 70 %
FEV1-%Pred-Pre: 68 %
FEV1-Post: 2.49 L
FEV1-Pre: 2.41 L
FEV1FVC-%Change-Post: 0 %
FEV1FVC-%PRED-PRE: 99 %
FEV6-%Change-Post: 3 %
FEV6-%PRED-POST: 71 %
FEV6-%PRED-PRE: 69 %
FEV6-POST: 3.01 L
FEV6-PRE: 2.91 L
FEV6FVC-%PRED-PRE: 101 %
FEV6FVC-%Pred-Post: 101 %
FVC-%Change-Post: 4 %
FVC-%PRED-PRE: 68 %
FVC-%Pred-Post: 71 %
FVC-POST: 3.03 L
FVC-PRE: 2.91 L
PRE FEV6/FVC RATIO: 100 %
Post FEV1/FVC ratio: 82 %
Post FEV6/FVC ratio: 100 %
Pre FEV1/FVC ratio: 83 %
RV % pred: 91 %
RV: 1.53 L
TLC % pred: 79 %
TLC: 4.47 L

## 2014-09-26 NOTE — Progress Notes (Signed)
PFT done today. 

## 2014-12-30 ENCOUNTER — Other Ambulatory Visit: Payer: Self-pay | Admitting: Obstetrics and Gynecology

## 2014-12-31 LAB — CYTOLOGY - PAP

## 2015-09-08 ENCOUNTER — Emergency Department (HOSPITAL_COMMUNITY): Payer: Federal, State, Local not specified - PPO

## 2015-09-08 ENCOUNTER — Encounter (HOSPITAL_COMMUNITY): Payer: Self-pay | Admitting: *Deleted

## 2015-09-08 DIAGNOSIS — Z8669 Personal history of other diseases of the nervous system and sense organs: Secondary | ICD-10-CM | POA: Insufficient documentation

## 2015-09-08 DIAGNOSIS — E119 Type 2 diabetes mellitus without complications: Secondary | ICD-10-CM | POA: Diagnosis not present

## 2015-09-08 DIAGNOSIS — E669 Obesity, unspecified: Secondary | ICD-10-CM | POA: Diagnosis not present

## 2015-09-08 DIAGNOSIS — R51 Headache: Secondary | ICD-10-CM | POA: Diagnosis not present

## 2015-09-08 DIAGNOSIS — Z7982 Long term (current) use of aspirin: Secondary | ICD-10-CM | POA: Diagnosis not present

## 2015-09-08 DIAGNOSIS — Z91040 Latex allergy status: Secondary | ICD-10-CM | POA: Diagnosis not present

## 2015-09-08 DIAGNOSIS — Z79899 Other long term (current) drug therapy: Secondary | ICD-10-CM | POA: Insufficient documentation

## 2015-09-08 DIAGNOSIS — R079 Chest pain, unspecified: Secondary | ICD-10-CM | POA: Insufficient documentation

## 2015-09-08 LAB — BASIC METABOLIC PANEL
Anion gap: 12 (ref 5–15)
BUN: 10 mg/dL (ref 6–20)
CHLORIDE: 99 mmol/L — AB (ref 101–111)
CO2: 25 mmol/L (ref 22–32)
Calcium: 9.1 mg/dL (ref 8.9–10.3)
Creatinine, Ser: 0.61 mg/dL (ref 0.44–1.00)
GFR calc Af Amer: >60 mL/min (ref >60)
GLUCOSE: 324 mg/dL — AB (ref 65–99)
POTASSIUM: 3.6 mmol/L (ref 3.5–5.1)
Sodium: 136 mmol/L (ref 135–145)

## 2015-09-08 LAB — CBC
HCT: 40.1 % (ref 36.0–46.0)
HEMOGLOBIN: 13.2 g/dL (ref 12.0–15.0)
MCH: 28.9 pg (ref 26.0–34.0)
MCHC: 32.9 g/dL (ref 30.0–36.0)
MCV: 87.9 fL (ref 78.0–100.0)
PLATELETS: 324 10*3/uL (ref 150–400)
RBC: 4.56 MIL/uL (ref 3.87–5.11)
RDW: 12 % (ref 11.5–15.5)
WBC: 10.1 10*3/uL (ref 4.0–10.5)

## 2015-09-08 LAB — TROPONIN I

## 2015-09-08 NOTE — ED Notes (Signed)
The pt is c/o chest pain for 25 minutes and she already had a headache.  lmp  2 weeks ago

## 2015-09-09 ENCOUNTER — Emergency Department (HOSPITAL_COMMUNITY)
Admission: EM | Admit: 2015-09-09 | Discharge: 2015-09-09 | Disposition: A | Discharge status: Home / Self Care | Payer: Federal, State, Local not specified - PPO | Attending: Emergency Medicine | Admitting: Emergency Medicine

## 2015-09-09 DIAGNOSIS — R079 Chest pain, unspecified: Secondary | ICD-10-CM

## 2015-09-09 LAB — TROPONIN I: Troponin I: <0.03 ng/mL (ref <0.031)

## 2015-09-09 NOTE — ED Provider Notes (Signed)
CSN: [id number]     Arrival date & time 09/08/15  2125 History  By signing my name below, I, Altamease Oiler, attest that this documentation has been prepared under the direction and in the presence of Sharlett Iles, MD. Electronically Signed: Altamease Oiler, ED Scribe. 09/09/2015. 3:27 AM   Chief Complaint  Patient presents with  . Chest Pain  . Headache   The history is provided by the patient. No language interpreter was used.   [name] is a 35 y.o. female with PMHx of DM and obesity who presents to the Emergency Department complaining of intermittent, left-sided  chest pain with onset 25 minutes PTA while sitting at home. The episodes of pain last for a few minutes at the time. She has had similar pain over the last few months that was less severe. Associated symptoms include sweating and gradual onset of headache (resolved). Pt has hx of migraines but states this was less severe. Pt denies nausea, vomiting, SOB, vision change, change in strength. She reports that her blood pressure typically runs low. Pt denies smoking. Her father died of cardiac arrest at 102. No recent travel or hormonal therapy. No history of DVT/PE or cancer. Her blood sugar has not been well-controlled at home, typically running near 600.   Past Medical History  Diagnosis Date  . Diabetes mellitus without complication ([place])   . Carpal tunnel syndrome   . Back pain   . Obesity    Past Surgical History  Procedure Laterality Date  . Cesarean section     No family history on file. Social History  Substance Use Topics  . Smoking status: Never Smoker   . Smokeless tobacco: None  . Alcohol Use: No   OB History    No data available     Review of Systems  10 Systems reviewed and all are negative for acute change except as noted in the HPI.   Allergies  Latex  Home Medications   Prior to Admission medications   Medication Sig Start Date End Date Taking? Authorizing Provider  albuterol  (PROVENTIL HFA;VENTOLIN HFA) 108 (90 Base) MCG/ACT inhaler Inhale 1-2 puffs into the lungs every 6 (six) hours as needed for wheezing or shortness of breath.   Yes Historical Provider, MD  aspirin 81 MG tablet Take 81 mg by mouth daily.   Yes Historical Provider, MD  atorvastatin (LIPITOR) 10 MG tablet Take 10 mg by mouth daily. 03/01/14  Yes Historical Provider, MD  cyclobenzaprine (FLEXERIL) 10 MG tablet Take 10 mg by mouth 3 (three) times daily as needed for muscle spasms.   Yes Historical Provider, MD  losartan (COZAAR) 25 MG tablet Take 25 mg by mouth daily. 03/01/14  Yes Historical Provider, MD  naproxen (NAPROSYN) 500 MG tablet Take 500 mg by mouth as needed for mild pain.   Yes Historical Provider, MD  ONGLYZA 5 MG TABS tablet Take 5 mg by mouth daily. 03/03/14  Yes Historical Provider, MD   BP 116/49 mmHg  Pulse 76  Temp(Src) 97.9 F (36.6 C)  Resp 18  Ht 5\' 8"  (1.727 m)  Wt 267 lb 9 oz (121.366 kg)  BMI 40.69 kg/m2  SpO2 99%  LMP 08/29/2015 Physical Exam  Constitutional: She is oriented to person, place, and time. She appears well-developed and well-nourished. No distress.  HENT:  Head: Normocephalic and atraumatic.  Moist mucous membranes  Eyes: Conjunctivae are normal. Pupils are equal, round, and reactive to light.  Neck: Neck supple.  Cardiovascular: Normal  rate, regular rhythm and normal heart sounds.   No murmur heard. Pulmonary/Chest: Effort normal and breath sounds normal.  Abdominal: Soft. Bowel sounds are normal. She exhibits no distension. There is no tenderness.  Musculoskeletal: She exhibits no edema.  Neurological: She is alert and oriented to person, place, and time.  Fluent speech  Skin: Skin is warm and dry.  Psychiatric: She has a normal mood and affect. Judgment normal.  Nursing note and vitals reviewed.   ED Course  Procedures (including critical care time) DIAGNOSTIC STUDIES: Oxygen Saturation is 99% on RA,  normal by my interpretation.     COORDINATION OF CARE: 3:22 AM Discussed treatment plan which includes lab work, CXR, EKG with pt at bedside and pt agreed to plan.  Labs Review Labs Reviewed  BASIC METABOLIC PANEL - Abnormal; Notable for the following:    Chloride 99 (*)    Glucose, Bld 324 (*)    All other components within normal limits  CBC  TROPONIN I  TROPONIN I    Imaging Review Dg Chest 2 View  09/08/2015  CLINICAL DATA:  Headache for 1-2 hours. Mid left chest pain. Diabetes. EXAM: CHEST  2 VIEW COMPARISON:  09/30/2010 FINDINGS: The heart size and mediastinal contours are within normal limits. Both lungs are clear. The visualized skeletal structures are unremarkable. IMPRESSION: No active cardiopulmonary disease. Electronically Signed   By: Lucienne Capers M.D.   On: 09/08/2015 22:01   I have personally reviewed and evaluated these lab results as part of my medical decision-making.   EKG Interpretation   Date/Time:  Tuesday Sep 08 2015 21:29:21 EDT Ventricular Rate:  81 PR Interval:  176 QRS Duration: 100 QT Interval:  402 QTC Calculation: 466 R Axis:   -9 Text Interpretation:  Normal sinus rhythm Incomplete right bundle branch  block Borderline ECG No significant change since last tracing Confirmed by  Alyx Mcguirk MD, Mikah Rottinghaus [id number]) on 09/09/2015 7:38:53 AM      MDM   Final diagnoses:  Chest pain, unspecified chest pain type   Patient presents with episode of chest pain that occurred this evening as well as a gradual onset of mild headache that has already resolved. Patient has had several episodes of similar chest pain previously over the past several months. On exam, she was comfortable and in no acute distress. Vital signs notable for BP 92/51 which patient states is normal for her. EKG without ischemic changes and no significant change from previous. Obtained above lab work including serial troponins which was notable only for hyperglycemia without DKA. The pt is PERC negative therefore PE very  unlikely. Her HEART score is 2 therefore I feel she is safe for discharge home with PCP follow-up. I have discussed importance of follow-up for consideration of stress test if her symptoms continue. Return precautions reviewed and patient voiced understanding. Patient discharged in satisfactory condition.  I personally performed the services described in this documentation, which was scribed in my presence. The recorded information has been reviewed and is accurate.    Sharlett Iles, MD 09/09/15 [id number]

## 2015-11-03 ENCOUNTER — Other Ambulatory Visit: Payer: Self-pay | Admitting: Family Medicine

## 2015-11-03 DIAGNOSIS — Z1231 Encounter for screening mammogram for malignant neoplasm of breast: Secondary | ICD-10-CM

## 2015-11-19 ENCOUNTER — Other Ambulatory Visit: Payer: Self-pay | Admitting: Family Medicine

## 2015-11-19 ENCOUNTER — Ambulatory Visit
Admission: RE | Admit: 2015-11-19 | Discharge: 2015-11-19 | Disposition: A | Discharge status: Home / Self Care | Payer: Federal, State, Local not specified - PPO | Source: Ambulatory Visit | Attending: Family Medicine | Admitting: Family Medicine

## 2015-11-19 DIAGNOSIS — N63 Unspecified lump in unspecified breast: Secondary | ICD-10-CM

## 2015-11-19 DIAGNOSIS — Z1231 Encounter for screening mammogram for malignant neoplasm of breast: Secondary | ICD-10-CM

## 2015-11-19 DIAGNOSIS — N644 Mastodynia: Secondary | ICD-10-CM

## 2015-11-20 ENCOUNTER — Other Ambulatory Visit: Payer: Self-pay | Admitting: Family Medicine

## 2015-11-20 DIAGNOSIS — N63 Unspecified lump in unspecified breast: Secondary | ICD-10-CM

## 2015-11-20 DIAGNOSIS — N644 Mastodynia: Secondary | ICD-10-CM

## 2015-11-22 DIAGNOSIS — R079 Chest pain, unspecified: Secondary | ICD-10-CM | POA: Diagnosis present

## 2015-11-22 NOTE — H&P (Signed)
OFFICE VISIT NOTES COPIED TO EPIC FOR DOCUMENTATION  . History of Present Illness Laverda Page MD; 10/28/2015 6:48 PM) Patient words: FU nuc, echo, labs; Last OV 10/15/2015.  The patient is a 35 year old female who presents for a follow-up for Chest pain. She has a history of uncontrolled diabetes, she presented to the emergency department on 09/09/2015 with complaints of left-sided chest pain approximately 25 minutes prior to arrival. She was ruled out for ACS and discharged home and referred here for follow up and reevaluation. I'd seen her 3 weeks ago, patient has uncontrolled diabetes mellitus, morbid obesity, hyperlipidemia.  Chest pain has to competence, heaviness in the middle of the chest with exertional activities which is relieved with rest, she also has sharp pains in the left side of the chest that has no exertional relationship and is reproducible by certain movements of the body. She continues to have exertional chest pain and also sharp pains. She recently started to exercise, states that 3 days ago she was in a pool and after swimming she had severe chest tightness and she had to sit down and rest for a while for chest pain to resolve. She also reports dyspnea with exertion such as climbing a flight of stairs. Denies any PND, orthopnea, edema, syncope, or symptoms suggestive of claudication or TIA.   Problem List/Past Medical Franky Macho Reader; 10/28/2015 3:55 PM) Atypical chest pain (R07.89)  Exercise sestamibi stress test 10/23/2015: 1. The resting electrocardiogram demonstrated normal sinus rhythm, normal resting conduction and no resting arrhythmias. The stress electrocardiogram was normal. Patient exercised on Bruce protocol for 8.00 minutes and achieved 8.0 METS. Stress test terminated due to target heart rate( 86% MPHR). Stress symptoms included dyspnea. 2. The perfusion imaging study demonstrates a medium-sized moderate ischemia in the anteroseptal region extending from  the mid ventricle towards the apex. In addition there is a small sized mild ischemia in the apical lateral wall. Left ventricular systolic function calculated by QGS was 49%. This is an intermediate risk study due to normal EKG with good exercise tolerance, clinical correlation recommended. 3. Additionally, there appears to be increased uptake in the breast tissue, consider mammography if clinically indicated. Uncontrolled type 2 diabetes mellitus with complication, with long-term current use of insulin (E11.8, E11.65)  Morbid obesity with BMI of 40.0-44.9, adult (E66.01)  Dyspnea on exertion (R06.09)  Echocardiogram 10/21/2015: Left ventricle cavity is normal in size. Normal global wall motion. Normal diastolic filling pattern. Calculated EF 61%. Trace mitral regurgitation. Trace tricuspid regurgitation. Unable to estimate PA pressure due to absence/minimal TR signal. IVC is normal. poorly visualized. Radiation exposure screen (Z13.88)  10/23/2014: upreg negative Carpal tunnel syndrome (G56.00)  Back pain (M54.9)   Allergies ([place] Reader; 10/28/2015 3:55 PM) Latex   Family History Franky Macho Reader; 10/28/2015 3:55 PM) Mother  Living; No Known Heart conditions Father  Deceased. at age 49 from a Heart Condition Brother 1  Younger  Social History ([place] Reader; 10/28/2015 3:55 PM) Current tobacco use  Never smoker. Non Drinker/No Alcohol Use  Marital status  Separated. Number of Children  1. Living Situation  Lives with Partner  Past Surgical History ([place] Reader; 10/28/2015 3:55 PM) Dilation and Curettage of Uterus  Several prior to C-section Cesarean Delivery 2012  Medication History ([place] Reader; 10/28/2015 4:02 PM) Albuterol Sulfate (108 (90 Base)MCG/ACT Aero Pow Br Act, 1-2 puffs Inhalation every 6 hours as needed for wheezing or shortness of breath) Active. Aspirin (81MG  Tablet, 1 Oral daily) Active. Atorvastatin Calcium (10MG  Tablet, 1  Oral daily)  Active. Cyclobenzaprine HCl (10MG  Tablet, 1 Oral three times daily, as needed for muscle spasms) Active. Losartan Potassium (25MG  Tablet, 1 Oral daily) Active. Naproxen (500MG  Tablet, 1 Oral as needed for mild pain) Active. Fluconazole (200MG  Tablet, 1 Oral as needed) Active. Victoza (3 Units Subcutaneous daily) Specific dose unknown - Active. Medications Reconciled (verbally with pt)  Diagnostic Studies History ([place] Reader; 10/28/2015 4:01 PM) Echocardiogram 10/21/2015 Left ventricle cavity is normal in size. Normal global wall motion. Normal diastolic filling pattern. Calculated EF 61%. Trace mitral regurgitation. Trace tricuspid regurgitation. Unable to estimate PA pressure due to absence/minimal TR signal. IVC is normal. poorly visualized. Treadmill stress test 10/23/2015 1. The resting electrocardiogram demonstrated normal sinus rhythm, normal resting conduction and no resting arrhythmias. The stress electrocardiogram was normal. Patient exercised on Bruce protocol for 8.00 minutes and achieved 8.0 METS. Stress test terminated due to target heart rate( 86% MPHR). Stress symptoms included dyspnea. 2. The perfusion imaging study demonstrates a medium-sized moderate ischemia in the anteroseptal region extending from the mid ventricle towards the apex. In addition there is a small sized mild ischemia in the apical lateral wall. Left ventricular systolic function calculated by QGS was 49%. This is an intermediate risk study due to normal EKG with good exercise tolerance, clinical correlation recommended. 3. Additionally, there appears to be increased uptake in the breast tissue, consider mammography if clinically indicated. Labwork  10/15/2015: HbA1c 10.2%, total cholesterol 135, triglycerides 411, HDL 25, direct LDL 66 09/08/2015: glucose 324, creatinine 0.61, potassium 3.6, CBC normal, delta troponin negative x 2    Review of Systems Laverda Page, MD; 10/28/2015 6:49 PM) General Not  Present- Anorexia, Fatigue and Fever. Respiratory Present- Decreased Exercise Tolerance and Difficulty Breathing on Exertion. Not Present- Cough. Cardiovascular Present- Chest Pain. Not Present- Claudications, Edema, Orthopnea, Palpitations and Paroxysmal Nocturnal Dyspnea. Gastrointestinal Not Present- Black, Tarry Stool, Change in Bowel Habits and Nausea. Neurological Not Present- Focal Neurological Symptoms and Syncope. Endocrine Not Present- Cold Intolerance, Excessive Sweating, Heat Intolerance and Thyroid Problems. Hematology Not Present- Anemia, Easy Bruising, Petechiae and Prolonged Bleeding.  Vitals Franky Macho Reader; 10/28/2015 4:04 PM) 10/28/2015 4:00 PM Weight: 274.06 lb Height: 68in Body Surface Area: 2.34 m Body Mass Index: 41.67 kg/m  Pulse: 84 (Regular)  P.OX: 98% (Room air) BP: 108/76 (Sitting, Left Arm, Standard)       Physical Exam Laverda Page, MD; 10/28/2015 6:49 PM) General Mental Status-Alert. General Appearance-Cooperative, Appears stated age, Not in acute distress. Build & Nutrition-Moderately built and Morbidly obese.  Head and Neck Neck -Note: Short neck and difficult to evaluate JVD.  Thyroid Gland Characteristics - no palpable nodules, no palpable enlargement.  Cardiovascular Cardiovascular examination reveals -normal heart sounds, regular rate and rhythm with no murmurs. Inspection Jugular vein - Right - No Distention.  Abdomen Inspection Contour - Obese and Pannus present. Palpation/Percussion Normal exam - Non Tender and No hepatosplenomegaly. Auscultation Normal exam - Bowel sounds normal.  Peripheral Vascular Lower Extremity Inspection - Bilateral - Inspection Normal. Palpation - Edema - Bilateral - No edema. Femoral pulse - Bilateral - Feeble(Pulsus difficult to feel due to patient's bodily habitus.), No Bruits. Popliteal pulse - Bilateral - Feeble(Pulsus difficult to feel due to patient's bodily habitus.).  Dorsalis pedis pulse - Bilateral - Normal. Posterior tibial pulse - Bilateral - Normal. Carotid arteries - Bilateral-No Carotid bruit.  Neurologic Neurologic evaluation reveals -alert and oriented x 3 with no impairment of recent or remote memory. Motor-Grossly intact without any focal deficits.  Musculoskeletal Global Assessment Left Lower Extremity - normal range of motion without pain. Right Lower Extremity - normal range of motion without pain.   Results Laverda Page MD; 10/28/2015 6:50 PM) Labs  Name Value Range Date HEMOGLOBIN GLYCLATED (HGB A1C) [id number])   Collected: 10/15/2015 11:11 AM Hemoglobin A1c 10.2 % 4.8-5.6 % Collected: 10/15/2015 11:11 AM LIPID PANEL ([id number])   Collected: 10/15/2015 11:11 AM Cholesterol, Total 135 mg/dL 100-199 mg/dL Collected: 10/15/2015 11:11 AM Triglycerides 411 mg/dL 0-149 mg/dL Collected: 10/15/2015 11:11 AM HDL Cholesterol 25 mg/dL >39 mg/dL Collected: 10/15/2015 11:11 AM VLDL Cholesterol Cal  mg/dL 5-40 mg/dL Collected: 10/15/2015 11:11 AM LDL Cholesterol Calc  mg/dL 0-99 mg/dL Collected: 10/15/2015 11:11 AM Comment:   Collected: 10/15/2015 11:11 AM LDL CHOLESTEROL-DIRECT ([id number])   Collected: 10/15/2015 11:11 AM LDL Chol. (Direct) 66 mg/dL 0-99 mg/dL Collected: 10/15/2015 11:11 AM URINE PREG TEST-VIS COL (09811)   Collected: 10/23/2015 10:58 AM Pregnancy Test, Urine Negative Negative Collected: 10/23/2015 10:58 AM  Procedures Name Value Date Myocardial perfusion imaging, tomographic (SPECT) (including attenuation correction, qualitative or quantitative wall motion, ejection fraction by first pass or gated technique, additional quantification, when performed); multiple studies, Comments: Exercise sestamibi stress test 10/23/2015: 1. The resting electrocardiogram demonstrated normal sinus rhythm, normal resting conduction and no resting arrhythmias. The  stress electrocardiogram was normal. Patient exercised on Bruce protocol for 8.00 minutes and achieved 8.0 METS. Stress test terminated due to target heart rate( 86% MPHR). Stress symptoms included dyspnea. 2. The perfusion imaging study demonstrates a medium-sized moderate ischemia in the anteroseptal region extending from the mid ventricle towards the apex. In addition there is a small sized mild ischemia in the apical lateral wall. Left ventricular systolic function calculated by QGS was 49%. This is an intermediate risk study due to normal EKG with good exercise tolerance, clinical correlation recommended. 3. Additionally, there appears to be increased uptake in the breast tissue, consider mammography if clinically indicated.  Performed: 10/25/2015 8:19 AM Echocardiography, transthoracic, real-time with image documentation (2D), includes M-mode recording, when performed, complete, with spectral Doppler echocardiography, and with color flow Doppler echocardiography [id number]) : CAD Comments: Echocardiogram 10/21/2015: Left ventricle cavity is normal in size. Normal global wall motion. Normal diastolic filling pattern. Calculated EF 61%. Trace mitral regurgitation. Trace tricuspid regurgitation. Unable to estimate PA pressure due to absence/minimal TR signal. IVC is normal. poorly visualized.  Performed: 10/21/2015 10:45 PM    Assessment & Plan Laverda Page MD; 10/28/2015 6:49 PM) Atypical chest pain (R07.89) Story: Exercise sestamibi stress test 10/23/2015: 1. The resting electrocardiogram demonstrated normal sinus rhythm, normal resting conduction and no resting arrhythmias. The stress electrocardiogram was normal. Patient exercised on Bruce protocol for 8.00 minutes and achieved 8.0 METS. Stress test terminated due to target heart rate( 86% MPHR). Stress symptoms included dyspnea. 2. The perfusion imaging study demonstrates a medium-sized moderate ischemia in the anteroseptal region extending  from the mid ventricle towards the apex. In addition there is a small sized mild ischemia in the apical lateral wall. Left ventricular systolic function calculated by QGS was 49%. This is an intermediate risk study due to normal EKG with good exercise tolerance, clinical correlation recommended. 3. Additionally, there appears to be increased uptake in the breast tissue, consider mammography if clinically indicated. Impression: EKG 10/15/2015: Sinus rhythm at a rate of 74 bpm, normal axis, incomplete right bundle branch block, no evidence of ischemia. Current Plans Complete electrocardiogram (93000) Started Metoprolol Succinate ER 25MG , 1 (one) Tablet daily, #30, 10/28/2015, Ref. x1. Started Nitrostat  0.4MG , 1 (one) Tablet every 5 minutes as needed for chest pain., #25, 10/28/2015, No Refill. Future Plans [id number]: METABOLIC PANEL, BASIC (99991111) - one time 11/16/2015: CBC & PLATELETS (AUTO) [id number]) - one time 11/16/2015: PT (PROTHROMBIN TIME) (09811) - one time Dyspnea on exertion (R06.09) Story: Echocardiogram 10/21/2015: Left ventricle cavity is normal in size. Normal global wall motion. Normal diastolic filling pattern. Calculated EF 61%. Trace mitral regurgitation. Trace tricuspid regurgitation. Unable to estimate PA pressure due to absence/minimal TR signal. IVC is normal. poorly visualized. 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 x3. Labwork Story: 10/15/2015: HbA1c 10.2%, total cholesterol 135, triglycerides 411, HDL 25, direct LDL 66  09/08/2015: glucose 324, creatinine 0.61, potassium 3.6, CBC normal, delta troponin negative x 2 Morbid obesity with BMI of 40.0-44.9, adult (E66.01)  Current Plans Mechanism of underlying disease process and action of  medications discussed with the patient. I discussed primary/secondary prevention and also dietary counseling was done. Patient's symptoms are suggestive of angina pectoris with although atypical symptoms are sharp chest pains but she also has chest tightness or heaviness that comes on with exertional activities and relieved with rest. She also has a abnormal stress test, hence overall with her ongoing cardiovascular risk factors I consider the stress test as high risk. I have recommended proceeding with coronary angiography. I started the patient on metoprolol succinate 25 mg by mouth daily, that pressure borderline low.  Started Vascepa for hypertriglyceridemia. Weight loss instructions given.  S/L NTG was prescribed and explained how to and when to use it and to notify us if there is change in frequency of use. Interaction with cialis-like agents (if applicable was discussed). Schedule for cardiac catheterization, and possible angioplasty. We discussed regarding risks, benefits, alternatives to this including stress testing, CTA and continued medical therapy. Patient wants to proceed. Understands <1-2% risk of death, stroke, MI, urgent CABG, bleeding, infection, renal failure but not limited to these. Video recording of the procedure shown to the patient. Office visit after the tests.  CC: Antony Contras, MD; Dagmar Hait, MD  Addendum Note(Bridgette Ebony Hail AGNP-C; 11/20/2015 4:12 PM) 11/18/2015: Glucose 198, creatinine 0.72, potassium 4.2, CBC normal, PT/INR normal  Labs stable to proceed with coronary angiogram.  I have personally reviewed the patient's record and performed physical exam and agree with the assessment and plan of Ms. Neldon Labella, NP-C.  Adrian Prows, MD 11/22/2015, 7:42 PM [facility] [place] Pager: [phone] Office: [phone] If no answer: Cell:  [phone]

## 2015-11-24 ENCOUNTER — Encounter (HOSPITAL_COMMUNITY)
Admission: RE | Disposition: A | Discharge status: Home / Self Care | Payer: Self-pay | Source: Ambulatory Visit | Attending: Cardiology

## 2015-11-24 ENCOUNTER — Ambulatory Visit (HOSPITAL_COMMUNITY)
Admission: RE | Admit: 2015-11-24 | Discharge: 2015-11-24 | Disposition: A | Discharge status: Home / Self Care | Payer: Federal, State, Local not specified - PPO | Source: Ambulatory Visit | Attending: Cardiology | Admitting: Cardiology

## 2015-11-24 DIAGNOSIS — E1165 Type 2 diabetes mellitus with hyperglycemia: Secondary | ICD-10-CM | POA: Insufficient documentation

## 2015-11-24 DIAGNOSIS — R072 Precordial pain: Secondary | ICD-10-CM | POA: Insufficient documentation

## 2015-11-24 DIAGNOSIS — Z7982 Long term (current) use of aspirin: Secondary | ICD-10-CM | POA: Insufficient documentation

## 2015-11-24 DIAGNOSIS — R9439 Abnormal result of other cardiovascular function study: Secondary | ICD-10-CM | POA: Insufficient documentation

## 2015-11-24 DIAGNOSIS — Z6841 Body Mass Index (BMI) 40.0 and over, adult: Secondary | ICD-10-CM | POA: Diagnosis not present

## 2015-11-24 DIAGNOSIS — I10 Essential (primary) hypertension: Secondary | ICD-10-CM | POA: Insufficient documentation

## 2015-11-24 DIAGNOSIS — R0609 Other forms of dyspnea: Secondary | ICD-10-CM | POA: Insufficient documentation

## 2015-11-24 DIAGNOSIS — Z794 Long term (current) use of insulin: Secondary | ICD-10-CM | POA: Diagnosis not present

## 2015-11-24 DIAGNOSIS — E782 Mixed hyperlipidemia: Secondary | ICD-10-CM | POA: Insufficient documentation

## 2015-11-24 DIAGNOSIS — R079 Chest pain, unspecified: Secondary | ICD-10-CM | POA: Diagnosis present

## 2015-11-24 HISTORY — PX: CARDIAC CATHETERIZATION: SHX172

## 2015-11-24 LAB — GLUCOSE, CAPILLARY: GLUCOSE-CAPILLARY: 183 mg/dL — AB (ref 65–99)

## 2015-11-24 LAB — PREGNANCY, URINE: PREG TEST UR: NEGATIVE

## 2015-11-24 SURGERY — LEFT HEART CATH AND CORONARY ANGIOGRAPHY

## 2015-11-24 MED ORDER — LIDOCAINE HCL (PF) 1 % IJ SOLN
INTRAMUSCULAR | Status: AC
  Filled 2015-11-24: qty 30

## 2015-11-24 MED ORDER — LIDOCAINE HCL (PF) 1 % IJ SOLN
INTRAMUSCULAR | Status: DC | PRN
  Administered 2015-11-24: 3 mL

## 2015-11-24 MED ORDER — HEPARIN (PORCINE) IN NACL 2-0.9 UNIT/ML-% IJ SOLN
INTRAMUSCULAR | Status: AC
  Filled 2015-11-24: qty 1000

## 2015-11-24 MED ORDER — HYDROMORPHONE HCL 1 MG/ML IJ SOLN
INTRAMUSCULAR | Status: DC | PRN
  Administered 2015-11-24 (×2): 0.5 mg via INTRAVENOUS

## 2015-11-24 MED ORDER — HEPARIN SODIUM (PORCINE) 1000 UNIT/ML IJ SOLN
INTRAMUSCULAR | Status: AC
  Filled 2015-11-24: qty 1

## 2015-11-24 MED ORDER — HEPARIN (PORCINE) IN NACL 2-0.9 UNIT/ML-% IJ SOLN
INTRAMUSCULAR | Status: DC | PRN
Start: 2015-11-24 — End: 2015-11-24
  Administered 2015-11-24: 1000 mL

## 2015-11-24 MED ORDER — VERAPAMIL HCL 2.5 MG/ML IV SOLN
INTRAVENOUS | Status: AC
  Filled 2015-11-24: qty 2

## 2015-11-24 MED ORDER — SODIUM CHLORIDE 0.9% FLUSH: 3.0000 mL | Freq: Two times a day (BID) | INTRAVENOUS | Status: DC

## 2015-11-24 MED ORDER — ASPIRIN 81 MG PO CHEW
81.0000 mg | CHEWABLE_TABLET | ORAL | Status: AC
  Administered 2015-11-24: 81 mg via ORAL

## 2015-11-24 MED ORDER — IOPAMIDOL (ISOVUE-370) INJECTION 76%
INTRAVENOUS | Status: DC | PRN
  Administered 2015-11-24: 85 mL via INTRA_ARTERIAL

## 2015-11-24 MED ORDER — NITROGLYCERIN 1 MG/10 ML FOR IR/CATH LAB
INTRA_ARTERIAL | Status: AC
  Filled 2015-11-24: qty 10

## 2015-11-24 MED ORDER — VERAPAMIL HCL 2.5 MG/ML IV SOLN
INTRA_ARTERIAL | Status: DC | PRN
  Administered 2015-11-24: 8 mL via INTRA_ARTERIAL

## 2015-11-24 MED ORDER — HYDROMORPHONE HCL 1 MG/ML IJ SOLN
INTRAMUSCULAR | Status: AC
  Filled 2015-11-24: qty 1

## 2015-11-24 MED ORDER — ASPIRIN 81 MG PO CHEW
CHEWABLE_TABLET | ORAL | Status: AC
  Filled 2015-11-24: qty 1

## 2015-11-24 MED ORDER — MIDAZOLAM HCL 2 MG/2ML IJ SOLN
INTRAMUSCULAR | Status: AC
  Filled 2015-11-24: qty 2

## 2015-11-24 MED ORDER — MIDAZOLAM HCL 2 MG/2ML IJ SOLN
INTRAMUSCULAR | Status: DC | PRN
  Administered 2015-11-24: 2 mg via INTRAVENOUS

## 2015-11-24 MED ORDER — SODIUM CHLORIDE 0.9% FLUSH: 3.0000 mL | INTRAVENOUS | Status: DC | PRN

## 2015-11-24 MED ORDER — SODIUM CHLORIDE 0.9 % WEIGHT BASED INFUSION: 3.0000 mL/kg/h | INTRAVENOUS | Status: AC

## 2015-11-24 MED ORDER — SODIUM CHLORIDE 0.9 % WEIGHT BASED INFUSION
1.0000 mL/kg/h | INTRAVENOUS | Status: DC
  Administered 2015-11-24: 1 mL/kg/h via INTRAVENOUS

## 2015-11-24 MED ORDER — SODIUM CHLORIDE 0.9 % IV SOLN: 250.0000 mL | INTRAVENOUS | Status: DC | PRN

## 2015-11-24 MED ORDER — HEPARIN SODIUM (PORCINE) 1000 UNIT/ML IJ SOLN
INTRAMUSCULAR | Status: DC | PRN
  Administered 2015-11-24: 5000 [IU] via INTRAVENOUS

## 2015-11-24 MED ORDER — SODIUM CHLORIDE 0.9 % WEIGHT BASED INFUSION
3.0000 mL/kg/h | INTRAVENOUS | Status: DC
  Administered 2015-11-24: 3 mL/kg/h via INTRAVENOUS

## 2015-11-24 SURGICAL SUPPLY — 9 items
CATH INFINITI JR4 5F (CATHETERS) ×2 IMPLANT
CATH OPTITORQUE TIG 4.0 5F (CATHETERS) ×2 IMPLANT
DEVICE RAD COMP TR BAND LRG (VASCULAR PRODUCTS) ×2 IMPLANT
GLIDESHEATH SLEND A-KIT 6F 20G (SHEATH) ×2 IMPLANT
KIT PV (KITS) ×2 IMPLANT
TRANSDUCER W/STOPCOCK (MISCELLANEOUS) ×2 IMPLANT
TRAY PV CATH (CUSTOM PROCEDURE TRAY) ×2 IMPLANT
TUBING CIL FLEX 10 FLL-RA (TUBING) ×4 IMPLANT
WIRE SAFE-T 1.5MM-J .035X260CM (WIRE) ×2 IMPLANT

## 2015-11-24 NOTE — Interval H&P Note (Signed)
History and Physical Interval Note:  11/24/2015 8:06 AM  [name]  has presented today for surgery, with the diagnosis of abnormal stress test  The various methods of treatment have been discussed with the patient and family. After consideration of risks, benefits and other options for treatment, the patient has consented to  Procedure(s): Left Heart Cath and Coronary Angiography (N/A) and possible PCI as a surgical intervention .  The patient's history has been reviewed, patient examined, no change in status, stable for surgery.  I have reviewed the patient's chart and labs.  Questions were answered to the patient's satisfaction.    Ischemic Symptoms? CCS II (Slight limitation of ordinary activity) Anti-ischemic Medical Therapy? Maximal Medical Therapy (2 or more classes of medications) Non-invasive Test Results? High-risk stress test findings: cardiac mortality >3%/yr Prior CABG? No Previous CABG   Patient Information:   1-2V CAD, no prox LAD  A (8)  Indication: 19; Score: 8   Patient Information:   CTO of 1 vessel, no other CAD  A (7)  Indication: 29; Score: 7   Patient Information:   1V CAD with prox LAD  A (9)  Indication: 35; Score: 9   Patient Information:   2V-CAD with prox LAD  A (9)  Indication: 41; Score: 9   Patient Information:   3V-CAD without LMCA  A (9)  Indication: 47; Score: 9   Patient Information:   3V-CAD without LMCA With Abnormal LV systolic function  A (9)  Indication: 48; Score: 9   Patient Information:   LMCA-CAD  A (9)  Indication: 49; Score: 9   Patient Information:   2V-CAD with prox LAD PCI  A (7)  Indication: 62; Score: 7   Patient Information:   2V-CAD with prox LAD CABG  A (8)  Indication: 62; Score: 8   Patient Information:   3V-CAD without LMCA With Low CAD burden(i.e., 3 focal stenoses, low SYNTAX score) PCI  A (7)  Indication: 63; Score: 7   Patient Information:   3V-CAD without  LMCA With Low CAD burden(i.e., 3 focal stenoses, low SYNTAX score) CABG  A (9)  Indication: 63; Score: 9   Patient Information:   3V-CAD without LMCA E06c - Intermediate-high CAD burden (i.e., multiple diffuse lesions, presence of CTO, or high SYNTAX score) PCI  U (4)  Indication: 64; Score: 4   Patient Information:   3V-CAD without LMCA E06c - Intermediate-high CAD burden (i.e., multiple diffuse lesions, presence of CTO, or high SYNTAX score) CABG  A (9)  Indication: 64; Score: 9   Patient Information:   LMCA-CAD With Isolated LMCA stenosis  PCI  U (6)  Indication: 65; Score: 6   Patient Information:   LMCA-CAD With Isolated LMCA stenosis  CABG  A (9)  Indication: 65; Score: 9   Patient Information:   LMCA-CAD Additional CAD, low CAD burden (i.e., 1- to 2-vessel additional involvement, low SYNTAX score) PCI  U (5)  Indication: 66; Score: 5   Patient Information:   LMCA-CAD Additional CAD, low CAD burden (i.e., 1- to 2-vessel additional involvement, low SYNTAX score) CABG  A (9)  Indication: 66; Score: 9   Patient Information:   LMCA-CAD Additional CAD, intermediate-high CAD burden (i.e., 3-vessel involvement, presence of CTO, or high SYNTAX score) PCI  I (3)  Indication: 67; Score: 3   Patient Information:   LMCA-CAD Additional CAD, intermediate-high CAD burden (i.e., 3-vessel involvement, presence of CTO, or high SYNTAX score) CABG  A (9)  Indication: 67;  Score: 9   [name]

## 2015-11-24 NOTE — Discharge Instructions (Signed)
NO METFORMIN/GLUCOPHAGE FOR 2 DAYS   Radial Site Care Refer to this sheet in the next few weeks. These instructions provide you with information about caring for yourself after your procedure. Your health care provider may also give you more specific instructions. Your treatment has been planned according to current medical practices, but problems sometimes occur. Call your health care provider if you have any problems or questions after your procedure. WHAT TO EXPECT AFTER THE PROCEDURE After your procedure, it is typical to have the following:  Bruising at the radial site that usually fades within 1-2 weeks.  Blood collecting in the tissue (hematoma) that may be painful to the touch. It should usually decrease in size and tenderness within 1-2 weeks. HOME CARE INSTRUCTIONS  Take medicines only as directed by your health care provider.  You may shower 24-48 hours after the procedure or as directed by your health care provider. Remove the bandage (dressing) and gently wash the site with plain soap and water. Pat the area dry with a clean towel. Do not rub the site, because this may cause bleeding.  Do not take baths, swim, or use a hot tub until your health care provider approves.  Check your insertion site every day for redness, swelling, or drainage.  Do not apply powder or lotion to the site.  Do not flex or bend the affected arm for 24 hours or as directed by your health care provider.  Do not push or pull heavy objects with the affected arm for 24 hours or as directed by your health care provider.  Do not lift over 10 lb (4.5 kg) for 5 days after your procedure or as directed by your health care provider.  Ask your health care provider when it is okay to:  Return to work or school.  Resume usual physical activities or sports.  Resume sexual activity.  Do not drive home if you are discharged the same day as the procedure. Have someone else drive you.  You may drive 24  hours after the procedure unless otherwise instructed by your health care provider.  Do not operate machinery or power tools for 24 hours after the procedure.  If your procedure was done as an outpatient procedure, which means that you went home the same day as your procedure, a responsible adult should be with you for the first 24 hours after you arrive home.  Keep all follow-up visits as directed by your health care provider. This is important. SEEK MEDICAL CARE IF:  You have a fever.  You have chills.  You have increased bleeding from the radial site. Hold pressure on the site. SEEK IMMEDIATE MEDICAL CARE IF:  You have unusual pain at the radial site.  You have redness, warmth, or swelling at the radial site.  You have drainage (other than a small amount of blood on the dressing) from the radial site.  The radial site is bleeding, and the bleeding does not stop after 30 minutes of holding steady pressure on the site.  Your arm or hand becomes pale, cool, tingly, or numb.   This information is not intended to replace advice given to you by your health care provider. Make sure you discuss any questions you have with your health care provider.   Document Released: 05/28/2010 Document Revised: 05/16/2014 Document Reviewed: 11/11/2013 Elsevier Interactive Patient Education 2016 [place] from Work, [employer], or Physical Activity _____________________________AMY OVERBY_____________________ needs to be excused from: _X____ Work _____ [employer] ____X_ Physical activity beginning  now and through the following date: ____7/19/2017________________. ____X_ He or she may return to work or school but should still avoid the following physical activity or activities from now until __________07/23/2017__________. Activity restrictions include: __X___ Lifting more than ______10_ lb _____ Sitting longer than __________ minutes at a time _____ Standing longer than ________ minutes at a  time _____ He or she may return to full physical activity as of ____________________. Health Care Provider Name (printed): ____MELANIE Omarie Parcell,RN____________________________________  Health Care Provider (signature): ___________________________________________ Date: ____07/18/2017____________   This information is not intended to replace advice given to you by your health care provider. Make sure you discuss any questions you have with your health care provider.   Document Released: 10/19/2000 Document Revised: 05/16/2014 Document Reviewed: 11/25/2013 Elsevier Interactive Patient Education [employer].

## 2015-11-24 NOTE — Progress Notes (Signed)
Discharge instructions given to pt verbally and in writing. Pt and family verbalize understanding and deny further questions

## 2015-11-25 ENCOUNTER — Encounter (HOSPITAL_COMMUNITY): Payer: Self-pay | Admitting: Cardiology

## 2015-11-26 ENCOUNTER — Other Ambulatory Visit: Payer: Federal, State, Local not specified - PPO

## 2015-12-03 ENCOUNTER — Ambulatory Visit
Admission: RE | Admit: 2015-12-03 | Discharge: 2015-12-03 | Disposition: A | Discharge status: Home / Self Care | Payer: Federal, State, Local not specified - PPO | Source: Ambulatory Visit | Attending: Family Medicine | Admitting: Family Medicine

## 2015-12-03 DIAGNOSIS — N63 Unspecified lump in unspecified breast: Secondary | ICD-10-CM

## 2015-12-03 DIAGNOSIS — N644 Mastodynia: Secondary | ICD-10-CM

## 2016-03-29 DIAGNOSIS — K080 Exfoliation of teeth due to systemic causes: Secondary | ICD-10-CM | POA: Diagnosis not present

## 2016-04-11 DIAGNOSIS — K080 Exfoliation of teeth due to systemic causes: Secondary | ICD-10-CM | POA: Diagnosis not present

## 2016-05-04 DIAGNOSIS — E118 Type 2 diabetes mellitus with unspecified complications: Secondary | ICD-10-CM | POA: Diagnosis not present

## 2016-05-20 DIAGNOSIS — H6692 Otitis media, unspecified, left ear: Secondary | ICD-10-CM | POA: Diagnosis not present

## 2016-05-24 DIAGNOSIS — H6982 Other specified disorders of Eustachian tube, left ear: Secondary | ICD-10-CM | POA: Diagnosis not present

## 2016-05-24 DIAGNOSIS — H6502 Acute serous otitis media, left ear: Secondary | ICD-10-CM | POA: Diagnosis not present

## 2016-05-24 DIAGNOSIS — H9012 Conductive hearing loss, unilateral, left ear, with unrestricted hearing on the contralateral side: Secondary | ICD-10-CM | POA: Diagnosis not present

## 2016-08-29 DIAGNOSIS — M9901 Segmental and somatic dysfunction of cervical region: Secondary | ICD-10-CM | POA: Diagnosis not present

## 2016-08-29 DIAGNOSIS — M9903 Segmental and somatic dysfunction of lumbar region: Secondary | ICD-10-CM | POA: Diagnosis not present

## 2016-08-29 DIAGNOSIS — M9902 Segmental and somatic dysfunction of thoracic region: Secondary | ICD-10-CM | POA: Diagnosis not present

## 2016-08-29 DIAGNOSIS — M9907 Segmental and somatic dysfunction of upper extremity: Secondary | ICD-10-CM | POA: Diagnosis not present

## 2016-08-30 DIAGNOSIS — M9901 Segmental and somatic dysfunction of cervical region: Secondary | ICD-10-CM | POA: Diagnosis not present

## 2016-08-30 DIAGNOSIS — M9907 Segmental and somatic dysfunction of upper extremity: Secondary | ICD-10-CM | POA: Diagnosis not present

## 2016-08-30 DIAGNOSIS — M9902 Segmental and somatic dysfunction of thoracic region: Secondary | ICD-10-CM | POA: Diagnosis not present

## 2016-08-30 DIAGNOSIS — M9903 Segmental and somatic dysfunction of lumbar region: Secondary | ICD-10-CM | POA: Diagnosis not present

## 2016-09-27 DIAGNOSIS — K080 Exfoliation of teeth due to systemic causes: Secondary | ICD-10-CM | POA: Diagnosis not present

## 2016-10-05 ENCOUNTER — Emergency Department (HOSPITAL_COMMUNITY)
Admission: EM | Admit: 2016-10-05 | Discharge: 2016-10-05 | Disposition: A | Discharge status: Home / Self Care | Payer: Federal, State, Local not specified - PPO | Attending: Emergency Medicine | Admitting: Emergency Medicine

## 2016-10-05 ENCOUNTER — Emergency Department (HOSPITAL_COMMUNITY): Payer: Federal, State, Local not specified - PPO

## 2016-10-05 ENCOUNTER — Encounter (HOSPITAL_COMMUNITY): Payer: Self-pay

## 2016-10-05 DIAGNOSIS — M545 Low back pain: Secondary | ICD-10-CM | POA: Insufficient documentation

## 2016-10-05 DIAGNOSIS — G8929 Other chronic pain: Secondary | ICD-10-CM

## 2016-10-05 DIAGNOSIS — Z7982 Long term (current) use of aspirin: Secondary | ICD-10-CM | POA: Insufficient documentation

## 2016-10-05 DIAGNOSIS — E119 Type 2 diabetes mellitus without complications: Secondary | ICD-10-CM | POA: Diagnosis not present

## 2016-10-05 DIAGNOSIS — Z91040 Latex allergy status: Secondary | ICD-10-CM | POA: Diagnosis not present

## 2016-10-05 LAB — CBG MONITORING, ED: GLUCOSE-CAPILLARY: 189 mg/dL — AB (ref 65–99)

## 2016-10-05 LAB — POC URINE PREG, ED: Preg Test, Ur: NEGATIVE

## 2016-10-05 MED ORDER — DICLOFENAC SODIUM 50 MG PO TBEC: 50.0000 mg | DELAYED_RELEASE_TABLET | Freq: Two times a day (BID) | ORAL | 0 refills | Status: DC

## 2016-10-05 MED ORDER — DIAZEPAM 5 MG PO TABS: 5.0000 mg | ORAL_TABLET | Freq: Two times a day (BID) | ORAL | 0 refills | Status: DC | PRN

## 2016-10-05 MED ORDER — DIAZEPAM 5 MG PO TABS
5.0000 mg | ORAL_TABLET | Freq: Once | ORAL | Status: AC
  Administered 2016-10-05: 5 mg via ORAL
  Filled 2016-10-05: qty 1

## 2016-10-05 NOTE — Discharge Instructions (Signed)
Take the valium for muscle spasm. It can make you sleepy so do not drive while taking it. Follow up with your doctor or discuss you back problems when you go to the orthopedic doctor about your foot.

## 2016-10-05 NOTE — ED Triage Notes (Signed)
Patient complains of 1 week of lower back pain that she describes as chronic, denies trauma. States taking muscle relaxers with no relief. Now having some numbness down legs, no incontinence no trouble with ambulation. Has been out of diabetic meds for several months

## 2016-10-05 NOTE — ED Notes (Signed)
Pt ambulatory to restroom with steady gait.

## 2016-10-05 NOTE — ED Notes (Signed)
Pt taken to xray 

## 2016-10-05 NOTE — ED Provider Notes (Signed)
[facility] DEPT Provider Note   CSN: [id number] Arrival date & time: 10/05/16  1827   By signing my name below, I, [name], attest that this documentation has been prepared under the direction and in the presence of Paula Baller, NP Electronically Signed: Soijett Deleon, ED Scribe. 10/05/16. 1:52 AM.  History   Chief Complaint Chief Complaint  Patient presents with  . Back Pain    HPI [name] is a 36 y.o. female with a PMHx of back pain, DM, who presents to the Emergency Department complaining of acute-on-chronic, intermittent, lower back pain onset 6 days ago. Pt reports associated tingling sensation to RLE x yesterday. Pt has tried Rx flexeril, percocet, hydrocodone, robaxin, skelaxin, with no relief of her symptoms. She reports being out of her Percocet. Pt doesn't have a provider that she follows up with for her chronic lower back pain, but typically takes flexeril with relief. Pt denies nausea, vomiting, bowel/bladder incontinence, fever, chills, numbness, recent injury, and any other symptoms. Patient states that her legs feel numb sometimes. She drove herself here tonight.    The history is provided by the patient. No language interpreter was used.  Back Pain   This is a chronic problem. The current episode started more than 1 week ago. The problem occurs constantly. The problem has not changed since onset.The pain is associated with no known injury. The pain is present in the lumbar spine. The pain does not radiate. The pain is the same all the time. Associated symptoms include tingling. Pertinent negatives include no chest pain, no fever, no abdominal pain, no bowel incontinence, no bladder incontinence, no dysuria and no weakness. She has tried muscle relaxants for the symptoms. The treatment provided no relief.    Past Medical History:  Diagnosis Date  . Back pain   . Carpal tunnel syndrome   . Diabetes mellitus without complication ([place])   . Obesity     Patient  Active Problem List   Diagnosis Date Noted  . Chest pain with high risk for cardiac etiology 11/22/2015    Past Surgical History:  Procedure Laterality Date  . CARDIAC CATHETERIZATION N/A 11/24/2015   Procedure: Left Heart Cath and Coronary Angiography;  Surgeon: Adrian Prows, MD;  Location: [facility];  Service: Cardiovascular;  Laterality: N/A;  . CESAREAN SECTION      OB History    No data available       Home Medications    Prior to Admission medications   Medication Sig Start Date End Date Taking? Authorizing Provider  albuterol (PROVENTIL HFA;VENTOLIN HFA) 108 (90 Base) MCG/ACT inhaler Inhale 1-2 puffs into the lungs every 6 (six) hours as needed for wheezing or shortness of breath.    [provider]  aspirin 81 MG tablet Take 81 mg by mouth daily.    [provider]  atorvastatin (LIPITOR) 10 MG tablet Take 10 mg by mouth daily. 03/01/14   [provider]  cyclobenzaprine (FLEXERIL) 10 MG tablet Take 10 mg by mouth 3 (three) times daily as needed for muscle spasms.    [provider]  diazepam (VALIUM) 5 MG tablet Take 1 tablet (5 mg total) by mouth every 12 (twelve) hours as needed for anxiety. 10/05/16   Ashley Murrain, NP  diclofenac (VOLTAREN) 50 MG EC tablet Take 1 tablet (50 mg total) by mouth 2 (two) times daily. 10/05/16   Ashley Murrain, NP  losartan (COZAAR) 25 MG tablet Take 25 mg by mouth daily.  03/01/14   [provider]  metoprolol succinate (TOPROL-XL) 25 MG 24 hr tablet Take 25 mg by mouth daily. 10/28/15   [provider]  naproxen (NAPROSYN) 500 MG tablet Take 500 mg by mouth as needed for mild pain.    [provider]  nitroGLYCERIN (NITROSTAT) 0.4 MG SL tablet Place 0.4 mg under the tongue every 5 (five) minutes as needed for chest pain.    [provider]  ONGLYZA 5 MG TABS tablet Take 5 mg by mouth daily. 03/03/14   [provider]  VASCEPA 1 g CAPS Take 2 g by mouth 2 (two)  times daily. 10/28/15   [provider]    Family History No family history on file.  Social History Social History  Substance Use Topics  . Smoking status: Never Smoker  . Smokeless tobacco: Not on file  . Alcohol use No     Allergies   Latex   Review of Systems Review of Systems  Constitutional: Negative for chills and fever.  HENT: Negative.   Eyes: Negative for visual disturbance.  Respiratory: Negative for chest tightness.   Cardiovascular: Negative for chest pain.  Gastrointestinal: Negative for abdominal pain, bowel incontinence, nausea and vomiting.       No bowel incontinence.   Genitourinary: Negative for bladder incontinence, dysuria, frequency and urgency.       No bladder incontinence.   Musculoskeletal: Positive for back pain. Negative for neck pain.  Skin: Negative for wound.  Neurological: Positive for tingling. Negative for weakness.       +tingling sensation to RLE  Psychiatric/Behavioral: The patient is not nervous/anxious.      Physical Exam Updated Vital Signs BP 137/73 (BP Location: Right Arm)   Pulse 81   Temp 98.6 F (37 C) (Oral)   Resp 18   SpO2 99%   Physical Exam  Constitutional: She appears well-developed and well-nourished. No distress.  HENT:  Head: Normocephalic and atraumatic.  Right Ear: External ear normal.  Left Ear: External ear normal.  Mouth/Throat: Mucous membranes are normal.  Eyes: EOM are normal.  Neck: Normal range of motion. Neck supple.  FROM of neck without pain  Cardiovascular: Normal rate and regular rhythm.   Pedal pulses are 2+. Radial pulses are 2+.  Pulmonary/Chest: Effort normal and breath sounds normal.  Abdominal: Soft. Bowel sounds are normal. There is no tenderness. There is no CVA tenderness.  No CVA tenderness.   Musculoskeletal:       Lumbar back: She exhibits tenderness, pain and spasm. She exhibits normal pulse.  No cervical or thoracic midline spinal tenderness. Tenderness over  lumbar spine.   Neurological: She is alert. She has normal strength. No cranial nerve deficit or sensory deficit. Gait normal.  Reflex Scores:      Bicep reflexes are 2+ on the right side and 2+ on the left side.      Brachioradialis reflexes are 2+ on the right side and 2+ on the left side.      Patellar reflexes are 2+ on the right side and 2+ on the left side. Grip strength equal. Steady gait. No foot drag.  Skin: Skin is warm and dry.  Psychiatric: She has a normal mood and affect. Her behavior is normal.  Nursing note and vitals reviewed.    ED Treatments / Results  DIAGNOSTIC STUDIES: Oxygen Saturation is 99% on RA, nl by my interpretation.    COORDINATION OF CARE: 9:29 PM Discussed treatment plan with pt at bedside  which includes CBG, lumbar spine xray, and pt agreed to plan.  11:29 PM- Pt has been visualized to be ambulating up and down the hallway with her daughter after being evaluated. She appears to be comfortable and ambulating without difficulty.   Labs (all labs ordered are listed, but only abnormal results are displayed) Labs Reviewed  CBG MONITORING, ED - Abnormal; Notable for the following:       Result Value   Glucose-Capillary 189 (*)    All other components within normal limits  POC URINE PREG, ED   Radiology Dg Lumbar Spine Complete  Result Date: 10/05/2016 CLINICAL DATA:  36 year old female with back pain.  No known injury. EXAM: LUMBAR SPINE - COMPLETE 4+ VIEW COMPARISON:  Lumbar spine radiograph dated 03/30/2015 FINDINGS: There is no evidence of lumbar spine fracture. Alignment is normal. Intervertebral disc spaces are maintained. IMPRESSION: Negative. Electronically Signed   By: Anner Crete M.D.   On: 10/05/2016 23:22    Procedures Procedures (including critical care time)  Medications Ordered in ED Medications  diazepam (VALIUM) tablet 5 mg (5 mg Oral Given 10/05/16 2216)     Initial Impression / Assessment and Plan / ED Course  I have  reviewed the triage vital signs and the nursing notes.  Patient with back pain.  No neurological deficits and normal neuro exam.  Patient is ambulatory.  No loss of bowel or bladder control.  No fever, night sweats, weight loss, h/o cancer, IVDA, no recent procedure to back. No urinary symptoms suggestive of UTI. Lumbar spine negative for acute findings. Pt will be discharged home with valium and voltaren Rx. Supportive care and return precaution discussed. Appears safe for discharge at this time. Follow up as indicated in discharge paperwork. Patient has an appointment with ortho next week for her foot fracture and will discuss her back pain at that visit.   Final Clinical Impressions(s) / ED Diagnoses   Final diagnoses:  Acute exacerbation of chronic low back pain    New Prescriptions Discharge Medication List as of 10/05/2016 11:33 PM    START taking these medications   Details  diazepam (VALIUM) 5 MG tablet Take 1 tablet (5 mg total) by mouth every 12 (twelve) hours as needed for anxiety., Starting Wed 10/05/2016, Print    diclofenac (VOLTAREN) 50 MG EC tablet Take 1 tablet (50 mg total) by mouth 2 (two) times daily., Starting Wed 10/05/2016, Print      I personally performed the services described in this documentation, which was scribed in my presence. The recorded information has been reviewed and is accurate.    [name] [place], [place] 10/06/16 2119    Varney Biles, MD 10/07/16 [phone]

## 2016-12-05 DIAGNOSIS — R200 Anesthesia of skin: Secondary | ICD-10-CM | POA: Diagnosis not present

## 2016-12-06 DIAGNOSIS — R200 Anesthesia of skin: Secondary | ICD-10-CM | POA: Diagnosis not present

## 2016-12-06 DIAGNOSIS — M545 Low back pain: Secondary | ICD-10-CM | POA: Diagnosis not present

## 2016-12-06 DIAGNOSIS — M5441 Lumbago with sciatica, right side: Secondary | ICD-10-CM | POA: Diagnosis not present

## 2016-12-07 ENCOUNTER — Ambulatory Visit (INDEPENDENT_AMBULATORY_CARE_PROVIDER_SITE_OTHER): Payer: Federal, State, Local not specified - PPO | Admitting: Family Medicine

## 2016-12-07 ENCOUNTER — Ambulatory Visit (HOSPITAL_COMMUNITY)
Admission: EM | Admit: 2016-12-07 | Discharge: 2016-12-07 | Disposition: A | Discharge status: Other Acute Inpatient Hospital | Payer: Federal, State, Local not specified - PPO

## 2016-12-07 ENCOUNTER — Encounter: Payer: Self-pay | Admitting: Family Medicine

## 2016-12-07 VITALS — BP 126/79 | HR 64 | Temp 98.1°F | Resp 18 | Ht 68.0 in | Wt 257.4 lb

## 2016-12-07 DIAGNOSIS — M5116 Intervertebral disc disorders with radiculopathy, lumbar region: Secondary | ICD-10-CM

## 2016-12-07 DIAGNOSIS — R159 Full incontinence of feces: Secondary | ICD-10-CM

## 2016-12-07 DIAGNOSIS — Z01818 Encounter for other preprocedural examination: Secondary | ICD-10-CM

## 2016-12-07 DIAGNOSIS — E119 Type 2 diabetes mellitus without complications: Secondary | ICD-10-CM | POA: Diagnosis not present

## 2016-12-07 DIAGNOSIS — E669 Obesity, unspecified: Secondary | ICD-10-CM

## 2016-12-07 DIAGNOSIS — Z6839 Body mass index (BMI) 39.0-39.9, adult: Secondary | ICD-10-CM | POA: Diagnosis not present

## 2016-12-07 DIAGNOSIS — R32 Unspecified urinary incontinence: Secondary | ICD-10-CM | POA: Diagnosis not present

## 2016-12-07 LAB — POCT URINALYSIS DIP (MANUAL ENTRY)
Bilirubin, UA: NEGATIVE
LEUKOCYTES UA: NEGATIVE
Nitrite, UA: NEGATIVE
UROBILINOGEN UA: 0.2 U/dL
pH, UA: 6 (ref 5.0–8.0)

## 2016-12-07 LAB — GLUCOSE, POCT (MANUAL RESULT ENTRY)
POC GLUCOSE: 134 mg/dL — AB (ref 70–99)
POC Glucose: 9.7 mg/dl — AB (ref 70–99)

## 2016-12-07 LAB — POCT CBC
Granulocyte percent: 49.7 %G (ref 37–80)
HCT, POC: 40.1 % (ref 37.7–47.9)
HEMOGLOBIN: 13.7 g/dL (ref 12.2–16.2)
Lymph, poc: 3.9 — AB (ref 0.6–3.4)
MCH, POC: 29.9 pg (ref 27–31.2)
MCHC: 34.3 g/dL (ref 31.8–35.4)
MCV: 87.3 fL (ref 80–97)
MID (cbc): 0.2 (ref 0–0.9)
MPV: 8.1 fL (ref 0–99.8)
POC Granulocyte: 4 (ref 2–6.9)
POC LYMPH PERCENT: 48.1 %L (ref 10–50)
POC MID %: 2.2 %M (ref 0–12)
Platelet Count, POC: 346 10*3/uL (ref 142–424)
RBC: 4.6 M/uL (ref 4.04–5.48)
RDW, POC: 12.4 %
WBC: 8.1 10*3/uL (ref 4.6–10.2)

## 2016-12-07 LAB — POCT GLYCOSYLATED HEMOGLOBIN (HGB A1C): HEMOGLOBIN A1C: 9.7

## 2016-12-07 LAB — POC MICROSCOPIC URINALYSIS (UMFC)

## 2016-12-07 MED ORDER — SITAGLIPTIN PHOSPHATE 100 MG PO TABS: 100.0000 mg | ORAL_TABLET | Freq: Every day | ORAL | 3 refills | Status: DC

## 2016-12-07 NOTE — Progress Notes (Signed)
Patient ID: [name], female    DOB: 1981-03-22  Age: 36 y.o. MRN: [id number]  Chief Complaint  Patient presents with  . Abnormal ECG    needs checked   . a1c    needs checked     Subjective:   36 year old lady who is here for a preoperative medical evaluation. She was sent over here by Dr. Rolena Infante, orthopedist, who is scheduled to a lumbar surgery on her next week. She apparently has been having major back problems for several months, and has developed some bowel and bladder incontinence and when he evaluated her with an MRI yesterday he felt like it was urgent to proceed with the surgery next week.  Patient is a known diabetic, not under treatment for the past year. She felt like the medicine she was on had been making her worse, and ceased going to her doctor at that time. She no longer has a primary care physician.  Last summer she had some cardiac evaluation done. Dr.Ganji worked her up including a catheterization. Apparently there was some delayed flow but no artery disease. She has apparently an issue with a valve. However nothing was deemed major that she was not instructed to follow-up with cardiology.  Past medical history: Operations: Cesarean section; several D&Cs Major medical illnesses: Diabetes as above, no other major illnesses Medication allergies none reported; is allergic to latex Regular medications: 81 mg aspirin, naproxen, Flexeril, and nitroglycerin for when necessary use  Family history: Strong family history for diabetes mellitus. Father is deceased.  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 She is separated, has 1 daughter 43 years old. Is unable to do much exercise because of her back.  Review of systems: Constitutional: Primarily limited by her back HEENT: Unremarkable Cardiovascular: Occasional mild this comfort in her chest. Has some large) for when necessary use. Last used it about 4 months ago Respiratory:  Unremarkable Gastrointestinal: Has had some bowel incontinence recently Genitourinary: Has had some urinary incontinence recently Musculoskeletal: Back pain radiating to the right leg Dermatologic: Unremarkable Endocrinologic: Last blood sugars checked a week or so ago was 193; has history of diabetes as noted above, not on therapy currently Neurologic: Radiculopathy as noted above Psychiatric: Unremarkable    Current allergies, medications, problem list, past/family and social histories reviewed.  Objective:  BP 126/79   Pulse 64   Temp 98.1 F (36.7 C) (Oral)   Resp 18   Ht 5\' 8"  (1.727 m)   Wt 257 lb 6.4 oz (116.8 kg)   LMP 12/05/2016   SpO2 97%   BMI 39.14 kg/m   Somewhat overweight young lady, BMI 39, in some discomfort and distress from her back. She takes it comfortably, and has a hard time moving around. HEENT eyes are PERRLA with EOMs intact. Neck supple without nodes thyromegaly. Chest clear drawstrings. Heart regular without murmurs, gallops, or arrhythmias. Abdomen soft without masses or tenderness. Extremities grossly unremarkable. Back exam not done. Skin unremarkable except for as a tattoo left breast.  Assessment & Plan:   Assessment: 1. Pre-operative clearance   2. Type 2 diabetes mellitus without complication, without long-term current use of insulin (HCC)   3. Class 2 obesity without serious comorbidity with body mass index (BMI) of 39.0 to 39.9 in adult, unspecified obesity type   4. Bowel and bladder incontinence   5. Lumbar disc disease with radiculopathy       Plan: Check her sugar, A1c, CBC. EKG was done and  shows a resting sinus bradycardia with mild sinus arrhythmia. Small RSR prime in V1 but no other abnormalities on the EKG could be noted. Due to right difference EKG looks different from one in the old record a year ago, which questioned LVH. This EKG has no evidence of LVH. Probably could be labeled as an incomplete RBBB.  Orders Placed This  Encounter  Procedures  . POCT glucose (manual entry)  . POCT CBC  . POCT glycosylated hemoglobin (Hb A1C)  . POCT Microscopic Urinalysis (UMFC)  . POCT urinalysis dipstick  . POCT glucose (manual entry)  . EKG 12-Lead   Results for orders placed or performed in visit on 12/07/16  POCT glucose (manual entry)  Result Value Ref Range   POC Glucose 9.7 (A) 70 - 99 mg/dl  POCT CBC  Result Value Ref Range   WBC 8.1 4.6 - 10.2 K/uL   Lymph, poc 3.9 (A) 0.6 - 3.4   POC LYMPH PERCENT 48.1 10 - 50 %L   MID (cbc) 0.2 0 - 0.9   POC MID % 2.2 0 - 12 %M   POC Granulocyte 4.0 2 - 6.9   Granulocyte percent 49.7 37 - 80 %G   RBC 4.60 4.04 - 5.48 M/uL   Hemoglobin 13.7 12.2 - 16.2 g/dL   HCT, POC 40.1 37.7 - 47.9 %   MCV 87.3 80 - 97 fL   MCH, POC 29.9 27 - 31.2 pg   MCHC 34.3 31.8 - 35.4 g/dL   RDW, POC 12.4 %   Platelet Count, POC 346 142 - 424 K/uL   MPV 8.1 0 - 99.8 fL  POCT glycosylated hemoglobin (Hb A1C)  Result Value Ref Range   Hemoglobin A1C 9.7   POCT Microscopic Urinalysis (UMFC)  Result Value Ref Range   WBC,UR,HPF,POC Few (A) None WBC/hpf   RBC,UR,HPF,POC None None RBC/hpf   Bacteria Many (A) None, Too numerous to count   Mucus Present (A) Absent   Epithelial Cells, UR Per Microscopy Many (A) None, Too numerous to count cells/hpf  POCT urinalysis dipstick  Result Value Ref Range   Color, UA yellow yellow   Clarity, UA cloudy (A) clear   Glucose, UA >=1,000 (A) negative mg/dL   Bilirubin, UA negative negative   Ketones, POC UA trace (5) (A) negative mg/dL   Spec Grav, UA >=1.030 (A) 1.010 - 1.025   Blood, UA small (A) negative   pH, UA 6.0 5.0 - 8.0   Protein Ur, POC trace (A) negative mg/dL   Urobilinogen, UA 0.2 0.2 or 1.0 E.U./dL   Nitrite, UA Negative Negative   Leukocytes, UA Negative Negative  POCT glucose (manual entry)  Result Value Ref Range   POC Glucose 134 (A) 70 - 99 mg/dl    No orders of the defined types were placed in this encounter.  Dr Einar Gip  last year did the following tests:  Procedures Name Value Date Myocardial perfusion imaging, tomographic (SPECT) (including attenuation correction, qualitative or quantitative wall motion, ejection fraction by first pass or gated technique, additional quantification, when performed); multiple studies, Comments: Exercise sestamibi stress test 10/23/2015: 1. The resting electrocardiogram demonstrated normal sinus rhythm, normal resting conduction and no resting arrhythmias. The stress electrocardiogram was normal. Patient exercised on Bruce protocol for 8.00 minutes and achieved 8.0 METS. Stress test terminated due to target heart rate( 86% MPHR). Stress symptoms included dyspnea. 2. The perfusion imaging study demonstrates a medium-sized moderate ischemia in the anteroseptal region extending from the mid ventricle towards  the apex. In addition there is a small sized mild ischemia in the apical lateral wall. Left ventricular systolic function calculated by QGS was 49%. This is an intermediate risk study due to normal EKG with good exercise tolerance, clinical correlation recommended. 3. Additionally, there appears to be increased uptake in the breast tissue, consider mammography if clinically indicated.  Performed: 10/25/2015 8:19 AM Echocardiography, transthoracic, real-time with image documentation (2D), includes M-mode recording, when performed, complete, with spectral Doppler echocardiography, and with color flow Doppler echocardiography (16109) : CAD Comments: Echocardiogram 10/21/2015: Left ventricle cavity is normal in size. Normal global wall motion. Normal diastolic filling pattern. Calculated EF 61%. Trace mitral regurgitation. Trace tricuspid regurgitation. Unable to estimate PA pressure due to absence/minimal TR signal. IVC is normal. poorly visualized.  Performed: 10/21/2015 10:45 PM    Assessment & Plan Laverda Page MD; 10/28/2015 6:49 PM) Atypical chest pain  (R07.89) Story: Exercise sestamibi stress test 10/23/2015: 1. The resting electrocardiogram demonstrated normal sinus rhythm, normal resting conduction and no resting arrhythmias. The stress electrocardiogram was normal. Patient exercised on Bruce protocol for 8.00 minutes and achieved 8.0 METS. Stress test terminated due to target heart rate( 86% MPHR). Stress symptoms included dyspnea. 2. The perfusion imaging study demonstrates a medium-sized moderate ischemia in the anteroseptal region extending from the mid ventricle towards the apex. In addition there is a small sized mild ischemia in the apical lateral wall. Left ventricular systolic function calculated by QGS was 49%. This is an intermediate risk study due to normal EKG with good exercise tolerance, clinical correlation recommended. 3. Additionally, there appears to be increased uptake in the breast tissue, consider mammography if clinically indicated. Impression: EKG 10/15/2015: Sinus rhythm at a rate of 74 bpm, normal axis, incomplete right bundle branch block, no evidence of ischemia. Current Plans Complete electrocardiogram (93000) Started Metoprolol Succinate ER 25MG , 1 (one) Tablet daily, #30, 10/28/2015, Ref. x1. Started Nitrostat 0.4MG , 1 (one) Tablet every 5 minutes as needed for chest pain., #25, 10/28/2015, No Refill. Future Plans 10/11/5407: METABOLIC PANEL, BASIC (81191) - one time 11/16/2015: CBC & PLATELETS (AUTO) (47829) - one time 11/16/2015: PT (PROTHROMBIN TIME) (56213) - one time Dyspnea on exertion (R06.09) Story: Echocardiogram 10/21/2015: Left ventricle cavity is normal in size. Normal global wall motion. Normal diastolic filling pattern. Calculated EF 61%. Trace mitral regurgitation. Trace tricuspid regurgitation. Unable to estimate PA pressure due to absence/minimal TR signal. IVC is normal. poorly visualized. 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 x3. Labwork Story: 10/15/2015: HbA1c 10.2%, total cholesterol 135, triglycerides 411, HDL 25, direct LDL 66  09/08/2015: glucose 324, creatinine 0.61, potassium 3.6, CBC normal, delta troponin negative x 2 Morbid obesity with BMI of 40.0-44.9, adult (E66.01)  Current Plans Mechanism of underlying disease process and action of medications discussed with the patient. I discussed primary/secondary prevention and also dietary counseling was done. Patient's symptoms are suggestive of angina pectoris with although atypical symptoms are sharp chest pains but she also has chest tightness or heaviness that comes on with exertional activities and relieved with rest. She also has a abnormal stress test, hence overall with her ongoing cardiovascular risk factors I consider the stress test as high risk. I have recommended proceeding with coronary angiography. I started the patient on metoprolol succinate 25 mg by mouth daily, that pressure  borderline low.  Started Vascepa for hypertriglyceridemia. Weight loss instructions given.  S/L NTG was prescribed and explained how to and when to use it and to notify us if there is change in frequency of use. Interaction with cialis-like agents (if applicable was discussed). Schedule for cardiac catheterization, and possible angioplasty. We discussed regarding risks, benefits, alternatives to this including stress testing, CTA and continued medical therapy. Patient wants to proceed. Understands <1-2% risk of death, stroke, MI, urgent CABG, bleeding, infection, renal failure but not limited to these. Video recording of the procedure shown to the patient. Office visit after the tests.      Patient Instructions   Begin taking Januvia 100 mg 1 daily  After your bowel control improves postoperatively, I would  recommend that you be started on a very low dose of metformin such as 250 mg daily and weekly go up on it until you are up as high as you can get an tolerate up to 2000 mg a day.  Make yourself an appointment to see one of the primary care physician's at [place] at [facility] for a regular family physician.  You are cleared for surgery, with the precautions of monitoring your diabetes closely and monitoring your cardiac status closely due to your reports from a year ago.    IF you received an x-ray today, you will receive an invoice from [facility]. Please contact [facility] at [phone] with questions or concerns regarding your invoice.   IF you received labwork today, you will receive an invoice from [facility]. Please contact [facility] at [phone] with questions or concerns regarding your invoice.   Our billing staff will not be able to assist you with questions regarding bills from these companies.  You will be contacted with the lab results as soon as they are available. The fastest way to get your results is to activate your My Chart account. Instructions are located on the last page of this paperwork. If you have not heard from [place] regarding the results in 2 weeks, please contact this office.        No Follow-up on file.   HOPPER,DAVID, MD 12/07/2016

## 2016-12-07 NOTE — Patient Instructions (Addendum)
Begin taking Januvia 100 mg 1 daily  After your bowel control improves postoperatively, I would recommend that you be started on a very low dose of metformin such as 250 mg daily and weekly go up on it until you are up as high as you can get an tolerate up to 2000 mg a day.  Make yourself an appointment to see one of the primary care physician's at [place] at [facility] for a regular family physician.  You are cleared for surgery, with the precautions of monitoring your diabetes closely and monitoring your cardiac status closely due to your reports from a year ago.    IF you received an x-ray today, you will receive an invoice from [facility]. Please contact [facility] at [phone] with questions or concerns regarding your invoice.   IF you received labwork today, you will receive an invoice from [facility]. Please contact [facility] at [phone] with questions or concerns regarding your invoice.   Our billing staff will not be able to assist you with questions regarding bills from these companies.  You will be contacted with the lab results as soon as they are available. The fastest way to get your results is to activate your My Chart account. Instructions are located on the last page of this paperwork. If you have not heard from [place] regarding the results in 2 weeks, please contact this office.

## 2016-12-07 NOTE — Progress Notes (Signed)
This was a consultation requested by the orthopedic surgeon. A preoperative consult should be billed as a consultation.

## 2016-12-08 ENCOUNTER — Telehealth: Payer: Self-pay

## 2016-12-08 NOTE — Telephone Encounter (Signed)
PA for patient was denied and I was advised through cover my meds to call [facility] for alternate drugs.  I faxed results next door to [facility] to give to Dr. Linna Darner.

## 2016-12-09 ENCOUNTER — Ambulatory Visit: Payer: Self-pay | Admitting: Physician Assistant

## 2016-12-09 NOTE — Pre-Procedure Instructions (Signed)
[name]  12/09/2016      [id number] [address], [place] - [place] [address] Phone: [phone] Fax: [phone]    Your procedure is scheduled on August 8  Report to [facility] Admitting at 1:45 P.M.  Call this number if you have problems the morning of surgery:  [phone]   Remember:  Do not eat food or drink liquids after midnight.   Take these medicines the morning of surgery with A SIP OF WATER acetaminophen (TYLENOL),  albuterol (PROVENTIL HFA;VENTOLIN HFA), fluconazole (DIFLUCAN),  fluticasone (FLONASE) , nitroGLYCERIN (NITROSTAT) if needed  7 days prior to surgery STOP taking any Aleve, Naproxen, Ibuprofen, Motrin, Advil, Goody's, BC's, all herbal medications, fish oil, and all vitamins  Follow your doctors instructions regarding your Aspirin.  If no instructions were given by the doctor you will need to call the office to get instructions.  Your pre admission RN will also call for those instructions  WHAT DO I DO ABOUT MY DIABETES MEDICATION?   Marland Kitchen Do not take oral diabetes medicines (pills) the morning of surgery. sitaGLIPtin (JANUVIA)   How to Manage Your Diabetes Before and After Surgery  Why is it important to control my blood sugar before and after surgery? . Improving blood sugar levels before and after surgery helps healing and can limit problems. . A way of improving blood sugar control is eating a healthy diet by: o  Eating less sugar and carbohydrates o  Increasing activity/exercise o  Talking with your doctor about reaching your blood sugar goals . High blood sugars (greater than 180 mg/dL) can raise your risk of infections and slow your recovery, so you will need to focus on controlling your diabetes during the weeks before surgery. . Make sure that the doctor who takes care of your diabetes knows about your planned surgery including the date and location.  How do I manage my  blood sugar before surgery? . Check your blood sugar at least 4 times a day, starting 2 days before surgery, to make sure that the level is not too high or low. o Check your blood sugar the morning of your surgery when you wake up and every 2 hours until you get to the Short Stay unit. . If your blood sugar is less than 70 mg/dL, you will need to treat for low blood sugar: o Do not take insulin. o Treat a low blood sugar (less than 70 mg/dL) with  cup of clear juice (cranberry or apple), 4 glucose tablets, OR glucose gel. o Recheck blood sugar in 15 minutes after treatment (to make sure it is greater than 70 mg/dL). If your blood sugar is not greater than 70 mg/dL on recheck, call [phone] for further instructions. . Report your blood sugar to the short stay nurse when you get to Short Stay.  . If you are admitted to the hospital after surgery: o Your blood sugar will be checked by the staff and you will probably be given insulin after surgery (instead of oral diabetes medicines) to make sure you have good blood sugar levels. o The goal for blood sugar control after surgery is 80-180 mg/dL.   Do not wear jewelry, make-up or nail polish.  Do not wear lotions, powders, or perfumes, or deoderant.  Do not shave 48 hours prior to surgery.  Men may shave face and neck.  Do not bring valuables to the hospital.  [facility] is  not responsible for any belongings or valuables.  Contacts, dentures or bridgework may not be worn into surgery.  Leave your suitcase in the car.  After surgery it may be brought to your room.  For patients admitted to the hospital, discharge time will be determined by your treatment team.  Patients discharged the day of surgery will not be allowed to drive home.    Special instructions:   [place]- Preparing For Surgery  Before surgery, you can play an important role. Because skin is not sterile, your skin needs to be as free of germs as possible. You can reduce  the number of germs on your skin by washing with CHG (chlorahexidine gluconate) Soap before surgery.  CHG is an antiseptic cleaner which kills germs and bonds with the skin to continue killing germs even after washing.  Please do not use if you have an allergy to CHG or antibacterial soaps. If your skin becomes reddened/irritated stop using the CHG.  Do not shave (including legs and underarms) for at least 48 hours prior to first CHG shower. It is OK to shave your face.  Please follow these instructions carefully.   1. Shower the NIGHT BEFORE SURGERY and the MORNING OF SURGERY with CHG.   2. If you chose to wash your hair, wash your hair first as usual with your normal shampoo.  3. After you shampoo, rinse your hair and body thoroughly to remove the shampoo.  4. Use CHG as you would any other liquid soap. You can apply CHG directly to the skin and wash gently with a scrungie or a clean washcloth.   5. Apply the CHG Soap to your body ONLY FROM THE NECK DOWN.  Do not use on open wounds or open sores. Avoid contact with your eyes, ears, mouth and genitals (private parts). Wash genitals (private parts) with your normal soap.  6. Wash thoroughly, paying special attention to the area where your surgery will be performed.  7. Thoroughly rinse your body with warm water from the neck down.  8. DO NOT shower/wash with your normal soap after using and rinsing off the CHG Soap.  9. Pat yourself dry with a CLEAN TOWEL.   10. Wear CLEAN PAJAMAS   11. Place CLEAN SHEETS on your bed the night of your first shower and DO NOT SLEEP WITH PETS.    Day of Surgery: Do not apply any deodorants/lotions. Please wear clean clothes to the hospital/surgery center.      Please read over the following fact sheets that you were given.

## 2016-12-12 ENCOUNTER — Encounter (HOSPITAL_COMMUNITY)
Admission: RE | Admit: 2016-12-12 | Discharge: 2016-12-12 | Disposition: A | Discharge status: Home / Self Care | Payer: Federal, State, Local not specified - PPO | Source: Ambulatory Visit | Attending: Orthopedic Surgery | Admitting: Orthopedic Surgery

## 2016-12-12 ENCOUNTER — Encounter (HOSPITAL_COMMUNITY): Payer: Self-pay

## 2016-12-12 DIAGNOSIS — Z01818 Encounter for other preprocedural examination: Secondary | ICD-10-CM | POA: Insufficient documentation

## 2016-12-12 DIAGNOSIS — E119 Type 2 diabetes mellitus without complications: Secondary | ICD-10-CM | POA: Diagnosis not present

## 2016-12-12 DIAGNOSIS — M5127 Other intervertebral disc displacement, lumbosacral region: Secondary | ICD-10-CM | POA: Insufficient documentation

## 2016-12-12 DIAGNOSIS — Z7951 Long term (current) use of inhaled steroids: Secondary | ICD-10-CM | POA: Diagnosis not present

## 2016-12-12 DIAGNOSIS — Z7984 Long term (current) use of oral hypoglycemic drugs: Secondary | ICD-10-CM | POA: Diagnosis not present

## 2016-12-12 DIAGNOSIS — M5117 Intervertebral disc disorders with radiculopathy, lumbosacral region: Secondary | ICD-10-CM | POA: Diagnosis not present

## 2016-12-12 DIAGNOSIS — M5106 Intervertebral disc disorders with myelopathy, lumbar region: Secondary | ICD-10-CM | POA: Diagnosis not present

## 2016-12-12 DIAGNOSIS — G834 Cauda equina syndrome: Secondary | ICD-10-CM | POA: Diagnosis not present

## 2016-12-12 DIAGNOSIS — Z9119 Patient's noncompliance with other medical treatment and regimen: Secondary | ICD-10-CM | POA: Diagnosis not present

## 2016-12-12 DIAGNOSIS — Z7982 Long term (current) use of aspirin: Secondary | ICD-10-CM | POA: Diagnosis not present

## 2016-12-12 DIAGNOSIS — Z79899 Other long term (current) drug therapy: Secondary | ICD-10-CM | POA: Diagnosis not present

## 2016-12-12 DIAGNOSIS — Z6839 Body mass index (BMI) 39.0-39.9, adult: Secondary | ICD-10-CM | POA: Diagnosis not present

## 2016-12-12 HISTORY — DX: Family history of other specified conditions: Z84.89

## 2016-12-12 HISTORY — DX: Other complications of anesthesia, initial encounter: T88.59XA

## 2016-12-12 HISTORY — DX: Other specified postprocedural states: R11.2

## 2016-12-12 HISTORY — DX: Adverse effect of unspecified anesthetic, initial encounter: T41.45XA

## 2016-12-12 HISTORY — DX: Dyspnea, unspecified: R06.00

## 2016-12-12 HISTORY — DX: Other specified postprocedural states: Z98.890

## 2016-12-12 HISTORY — DX: Rheumatic tricuspid insufficiency: I07.1

## 2016-12-12 LAB — SURGICAL PCR SCREEN
MRSA, PCR: NEGATIVE
STAPHYLOCOCCUS AUREUS: NEGATIVE

## 2016-12-12 LAB — GLUCOSE, CAPILLARY: GLUCOSE-CAPILLARY: 216 mg/dL — AB (ref 65–99)

## 2016-12-12 LAB — BASIC METABOLIC PANEL
Anion gap: 12 (ref 5–15)
BUN: 9 mg/dL (ref 6–20)
CALCIUM: 9.4 mg/dL (ref 8.9–10.3)
CO2: 26 mmol/L (ref 22–32)
CREATININE: 0.54 mg/dL (ref 0.44–1.00)
Chloride: 101 mmol/L (ref 101–111)
GFR calc Af Amer: >60 mL/min (ref >60)
GLUCOSE: 266 mg/dL — AB (ref 65–99)
Potassium: 3.8 mmol/L (ref 3.5–5.1)
Sodium: 139 mmol/L (ref 135–145)

## 2016-12-12 LAB — CBC
HCT: 41.9 % (ref 36.0–46.0)
Hemoglobin: 14.1 g/dL (ref 12.0–15.0)
MCH: 29.5 pg (ref 26.0–34.0)
MCHC: 33.7 g/dL (ref 30.0–36.0)
MCV: 87.7 fL (ref 78.0–100.0)
PLATELETS: 359 10*3/uL (ref 150–400)
RBC: 4.78 MIL/uL (ref 3.87–5.11)
RDW: 12.2 % (ref 11.5–15.5)
WBC: 9.3 10*3/uL (ref 4.0–10.5)

## 2016-12-12 LAB — HCG, SERUM, QUALITATIVE: Preg, Serum: NEGATIVE

## 2016-12-12 NOTE — Telephone Encounter (Signed)
Received another fax today restating the denial of Januvia. Faxed request to Dr. Tawanna Solo, but did not get a response. Do not see a new RX in chart.  Alternatives are as follows... Metformin Metformin ER Glipizide Metformin Glyburide-Metformin Pioglitazone-Metformin  Please advise.

## 2016-12-12 NOTE — Pre-Procedure Instructions (Addendum)
LILLIANE SPOSITO  12/12/2016      [id number] [address], [place] - [place] [address] Phone: [phone] Fax: [phone]    Your procedure is scheduled on 12/14/16.  Report to [facility] Admitting at  [address] [place].  Call this number if you have problems the morning of surgery:  [phone]   Remember:  Do not eat food or drink liquids after midnight. Dr said  May have Light breakfast  No later than 5am   Take these medicines the morning of surgery with A SIP OF WATER    Inhaler if needed(bring with you), nitro if needed  STOP all herbel meds, nsaids (aleve,naproxen,advil,ibuprofen)  prior to surgery starting today 12/12/16 including all vitamins/supplements, aspirin,fish oil   no oral diabetic med am of surgery(januvia or metformin)   How to Manage Your Diabetes Before and After Surgery  Why is it important to control my blood sugar before and after surgery? . Improving blood sugar levels before and after surgery helps healing and can limit problems. . A way of improving blood sugar control is eating a healthy diet by: o  Eating less sugar and carbohydrates o  Increasing activity/exercise o  Talking with your doctor about reaching your blood sugar goals . High blood sugars (greater than 180 mg/dL) can raise your risk of infections and slow your recovery, so you will need to focus on controlling your diabetes during the weeks before surgery. . Make sure that the doctor who takes care of your diabetes knows about your planned surgery including the date and location.  How do I manage my blood sugar before surgery? . Check your blood sugar at least 4 times a day, starting 2 days before surgery, to make sure that the level is not too high or low. o Check your blood sugar the morning of your surgery when you wake up and every 2 hours until you get to the Short Stay unit. . If your blood sugar is less than 70 mg/dL, you  will need to treat for low blood sugar: o Do not take insulin. o Treat a low blood sugar (less than 70 mg/dL) with  cup of clear juice (cranberry or apple), 4 glucose tablets, OR glucose gel. o Recheck blood sugar in 15 minutes after treatment (to make sure it is greater than 70 mg/dL). If your blood sugar is not greater than 70 mg/dL on recheck, call [phone] for further instructions. . Report your blood sugar to the short stay nurse when you get to Short Stay.  . If you are admitted to the hospital after surgery: o Your blood sugar will be checked by the staff and you will probably be given insulin after surgery (instead of oral diabetes medicines) to make sure you have good blood sugar levels. o The goal for blood sugar control after surgery is 80-180 mg/dL.    WHAT DO I DO ABOUT MY DIABETES MEDICATION?   Marland Kitchen Do not take oral diabetes medicines (pills) the morning of surgery.( no oral diabetic med am of surgery-januvia or metformin)    Do not wear jewelry, make-up or nail polish.  Do not wear lotions, powders, or perfumes, or deoderant.  Do not shave 48 hours prior to surgery.  Men may shave face and neck.  Do not bring valuables to the hospital.  [facility] is not responsible for any belongings or valuables.  Contacts, dentures or bridgework may not  be worn into surgery.  Leave your suitcase in the car.  After surgery it may be brought to your room.  For patients admitted to the hospital, discharge time will be determined by your treatment team.  Patients discharged the day of surgery will not be allowed to drive home.   Special instructions:   Special Instructions: [place] - Preparing for Surgery  Before surgery, you can play an important role.  Because skin is not sterile, your skin needs to be as free of germs as possible.  You can reduce the number of germs on you skin by washing with CHG (chlorahexidine gluconate) soap before surgery.  CHG is an antiseptic cleaner  which kills germs and bonds with the skin to continue killing germs even after washing.  Please DO NOT use if you have an allergy to CHG or antibacterial soaps.  If your skin becomes reddened/irritated stop using the CHG and inform your nurse when you arrive at Short Stay.  Do not shave (including legs and underarms) for at least 48 hours prior to the first CHG shower.  You may shave your face.  Please follow these instructions carefully:   1.  Shower with CHG Soap the night before surgery and the morning of Surgery.  2.  If you choose to wash your hair, wash your hair first as usual with your normal shampoo.  3.  After you shampoo, rinse your hair and body thoroughly to remove the Shampoo.  4.  Use CHG as you would any other liquid soap.  You can apply chg directly  to the skin and wash gently with scrungie or a clean washcloth.  5.  Apply the CHG Soap to your body ONLY FROM THE NECK DOWN.  Do not use on open wounds or open sores.  Avoid contact with your eyes ears, mouth and genitals (private parts).  Wash genitals (private parts)       with your normal soap.  6.  Wash thoroughly, paying special attention to the area where your surgery will be performed.  7.  Thoroughly rinse your body with warm water from the neck down.  8.  DO NOT shower/wash with your normal soap after using and rinsing off the CHG Soap.  9.  Pat yourself dry with a clean towel.            10.  Wear clean pajamas.            11.  Place clean sheets on your bed the night of your first shower and do not sleep with pets.  Day of Surgery  Do not apply any lotions/deodorants the morning of surgery.  Please wear clean clothes to the hospital/surgery center.  Please read over the fact sheets that you were given.

## 2016-12-13 NOTE — Progress Notes (Signed)
Anesthesia Chart Review:  Pt is a 36 year old female scheduled for L5-S1 lumbar decompression microdiscectomy on 12/14/2016 with Melina Schools, MD  - PCP is Ruben Reason, MD who cleared pt for surgery with caution to monitor blood glucose carefully as pt is untreated diabetic.   - Cardiologist is Kela Millin, MD. Last office visit 12/03/15 with Neldon Labella, NP  PMH includes:  DM (not on meds at this time), post-op N/V.  Never smoker. BMI 39  Medications include: Albuterol, ASA 81 mg  BP 138/72   Temp 36.7 C   Resp 18   Ht 5\' 8"  (1.727 m)   Wt 256 lb 4.8 oz (116.3 kg)   LMP 12/05/2016   SpO2 97%   BMI 38.97 kg/m   Preoperative labs reviewed.  Glucose 266.  HbA1c 9.7 on 12/07/16.   EKG 12/07/16: Sinus  Bradycardia (51 bpm). RSR(V1) -nondiagnostic.   Cardiac cath 11/24/15 (done for abnormal stress test):   The left ventricular systolic function is normal.  Normal coronary arteries, slow filling noted especially in the left coronary artery, improved with intracoronary nitroglycerin. Suggests microvascular disease.  Echo 10/21/15 [facility] cardiovascular): 1. LV cavity normal in size. Normal global wall motion. Normal diastolic filling pattern. Calculated EF 61%. 2. Trace mitral regurgitation. 3. Trace tricuspid regurgitation. Unable to estimate PA pressure due to absent/minimal TR signal. 4. IVC is normal, poorly visualized.  I left voicemail for [facility] in Dr. Rolena Infante' office about uncontrolled DM.  I also ordered Diabetes Coordinator consult.   If blood glucose acceptable DOS, I anticipate pt can proceed as scheduled.   Willeen Cass, FNP-BC [facility] Short Stay Surgical Center/Anesthesiology Phone: [phone] 12/13/2016 3:55 PM

## 2016-12-14 ENCOUNTER — Encounter (HOSPITAL_COMMUNITY): Payer: Self-pay

## 2016-12-14 ENCOUNTER — Ambulatory Visit (HOSPITAL_COMMUNITY): Payer: Federal, State, Local not specified - PPO

## 2016-12-14 ENCOUNTER — Observation Stay (HOSPITAL_COMMUNITY)
Admission: RE | Admit: 2016-12-14 | Discharge: 2016-12-15 | Disposition: A | Discharge status: Home / Self Care | Payer: Federal, State, Local not specified - PPO | Source: Ambulatory Visit | Attending: Orthopedic Surgery | Admitting: Orthopedic Surgery

## 2016-12-14 ENCOUNTER — Ambulatory Visit (HOSPITAL_COMMUNITY): Payer: Federal, State, Local not specified - PPO | Admitting: Emergency Medicine

## 2016-12-14 ENCOUNTER — Ambulatory Visit (HOSPITAL_COMMUNITY)
Admission: RE | Disposition: A | Discharge status: Home / Self Care | Payer: Self-pay | Source: Ambulatory Visit | Attending: Orthopedic Surgery

## 2016-12-14 ENCOUNTER — Ambulatory Visit (HOSPITAL_COMMUNITY): Payer: Federal, State, Local not specified - PPO | Admitting: Vascular Surgery

## 2016-12-14 DIAGNOSIS — M5126 Other intervertebral disc displacement, lumbar region: Secondary | ICD-10-CM | POA: Diagnosis present

## 2016-12-14 DIAGNOSIS — M5117 Intervertebral disc disorders with radiculopathy, lumbosacral region: Secondary | ICD-10-CM | POA: Diagnosis not present

## 2016-12-14 DIAGNOSIS — Z9119 Patient's noncompliance with other medical treatment and regimen: Secondary | ICD-10-CM | POA: Insufficient documentation

## 2016-12-14 DIAGNOSIS — R079 Chest pain, unspecified: Secondary | ICD-10-CM | POA: Diagnosis not present

## 2016-12-14 DIAGNOSIS — Z6839 Body mass index (BMI) 39.0-39.9, adult: Secondary | ICD-10-CM | POA: Diagnosis not present

## 2016-12-14 DIAGNOSIS — Z7984 Long term (current) use of oral hypoglycemic drugs: Secondary | ICD-10-CM | POA: Insufficient documentation

## 2016-12-14 DIAGNOSIS — G834 Cauda equina syndrome: Secondary | ICD-10-CM | POA: Diagnosis not present

## 2016-12-14 DIAGNOSIS — Z9889 Other specified postprocedural states: Secondary | ICD-10-CM | POA: Diagnosis not present

## 2016-12-14 DIAGNOSIS — M5127 Other intervertebral disc displacement, lumbosacral region: Secondary | ICD-10-CM | POA: Diagnosis not present

## 2016-12-14 DIAGNOSIS — Z7951 Long term (current) use of inhaled steroids: Secondary | ICD-10-CM | POA: Insufficient documentation

## 2016-12-14 DIAGNOSIS — Z79899 Other long term (current) drug therapy: Secondary | ICD-10-CM | POA: Insufficient documentation

## 2016-12-14 DIAGNOSIS — Z419 Encounter for procedure for purposes other than remedying health state, unspecified: Secondary | ICD-10-CM

## 2016-12-14 DIAGNOSIS — T888 Other specified complications of surgical and medical care, not elsewhere classified: Secondary | ICD-10-CM | POA: Diagnosis not present

## 2016-12-14 DIAGNOSIS — E119 Type 2 diabetes mellitus without complications: Secondary | ICD-10-CM | POA: Insufficient documentation

## 2016-12-14 DIAGNOSIS — Z7982 Long term (current) use of aspirin: Secondary | ICD-10-CM | POA: Insufficient documentation

## 2016-12-14 DIAGNOSIS — M5106 Intervertebral disc disorders with myelopathy, lumbar region: Secondary | ICD-10-CM | POA: Diagnosis not present

## 2016-12-14 HISTORY — PX: LUMBAR LAMINECTOMY/DECOMPRESSION MICRODISCECTOMY: SHX5026

## 2016-12-14 LAB — GLUCOSE, CAPILLARY
Glucose-Capillary: 181 mg/dL — ABNORMAL HIGH (ref 65–99)
Glucose-Capillary: 141 mg/dL — ABNORMAL HIGH (ref 65–99)

## 2016-12-14 SURGERY — LUMBAR LAMINECTOMY/DECOMPRESSION MICRODISCECTOMY 1 LEVEL
Anesthesia: General | Site: Back

## 2016-12-14 MED ORDER — HYDROMORPHONE HCL 1 MG/ML IJ SOLN
INTRAMUSCULAR | Status: AC
  Filled 2016-12-14: qty 0.5

## 2016-12-14 MED ORDER — CEFAZOLIN SODIUM-DEXTROSE 2-4 GM/100ML-% IV SOLN
INTRAVENOUS | Status: DC
Start: 2016-12-14 — End: 2016-12-14
  Filled 2016-12-14: qty 100

## 2016-12-14 MED ORDER — ALBUTEROL SULFATE (2.5 MG/3ML) 0.083% IN NEBU: 3.0000 mL | INHALATION_SOLUTION | Freq: Four times a day (QID) | RESPIRATORY_TRACT | Status: DC | PRN

## 2016-12-14 MED ORDER — ACETAMINOPHEN 10 MG/ML IV SOLN
1000.0000 mg | Freq: Once | INTRAVENOUS | Status: AC
  Administered 2016-12-14: 1000 mg via INTRAVENOUS
  Filled 2016-12-14: qty 100

## 2016-12-14 MED ORDER — EPHEDRINE 5 MG/ML INJ
INTRAVENOUS | Status: AC
  Filled 2016-12-14: qty 10

## 2016-12-14 MED ORDER — METHOCARBAMOL 500 MG PO TABS
500.0000 mg | ORAL_TABLET | Freq: Four times a day (QID) | ORAL | Status: DC | PRN
  Administered 2016-12-14 – 2016-12-15 (×2): 500 mg via ORAL
  Filled 2016-12-14 (×2): qty 1

## 2016-12-14 MED ORDER — INSULIN ASPART 100 UNIT/ML ~~LOC~~ SOLN
0.0000 [IU] | Freq: Three times a day (TID) | SUBCUTANEOUS | Status: DC
  Administered 2016-12-15: 5 [IU] via SUBCUTANEOUS

## 2016-12-14 MED ORDER — PROPOFOL 10 MG/ML IV BOLUS
INTRAVENOUS | Status: AC
  Filled 2016-12-14: qty 20

## 2016-12-14 MED ORDER — LINAGLIPTIN 5 MG PO TABS
5.0000 mg | ORAL_TABLET | Freq: Every day | ORAL | Status: DC
  Administered 2016-12-15: 5 mg via ORAL
  Filled 2016-12-14: qty 1

## 2016-12-14 MED ORDER — HYDROMORPHONE HCL 1 MG/ML IJ SOLN
INTRAMUSCULAR | Status: AC
  Administered 2016-12-14: 0.5 mg via INTRAVENOUS
  Filled 2016-12-14: qty 1

## 2016-12-14 MED ORDER — THROMBIN 20000 UNITS EX SOLR
CUTANEOUS | Status: AC
  Filled 2016-12-14: qty 20000

## 2016-12-14 MED ORDER — BUPIVACAINE-EPINEPHRINE (PF) 0.25% -1:200000 IJ SOLN
INTRAMUSCULAR | Status: AC
  Filled 2016-12-14: qty 30

## 2016-12-14 MED ORDER — HYDROMORPHONE HCL 1 MG/ML IJ SOLN
0.2500 mg | INTRAMUSCULAR | Status: DC | PRN
  Administered 2016-12-14 (×3): 0.5 mg via INTRAVENOUS

## 2016-12-14 MED ORDER — LACTATED RINGERS IV SOLN
INTRAVENOUS | Status: DC | PRN
  Administered 2016-12-14 (×2): via INTRAVENOUS

## 2016-12-14 MED ORDER — HYDROMORPHONE HCL 1 MG/ML IJ SOLN
INTRAMUSCULAR | Status: DC | PRN
  Administered 2016-12-14 (×2): 0.5 mg via INTRAVENOUS

## 2016-12-14 MED ORDER — ACETAMINOPHEN 10 MG/ML IV SOLN
1000.0000 mg | Freq: Four times a day (QID) | INTRAVENOUS | Status: DC
  Filled 2016-12-14: qty 100

## 2016-12-14 MED ORDER — SUGAMMADEX SODIUM 200 MG/2ML IV SOLN
INTRAVENOUS | Status: DC | PRN
  Administered 2016-12-14: 200 mg via INTRAVENOUS

## 2016-12-14 MED ORDER — MIDAZOLAM HCL 2 MG/2ML IJ SOLN
INTRAMUSCULAR | Status: DC | PRN
  Administered 2016-12-14: 2 mg via INTRAVENOUS

## 2016-12-14 MED ORDER — MEPERIDINE HCL 25 MG/ML IJ SOLN: 6.2500 mg | INTRAMUSCULAR | Status: DC | PRN

## 2016-12-14 MED ORDER — FENTANYL CITRATE (PF) 250 MCG/5ML IJ SOLN
INTRAMUSCULAR | Status: DC | PRN
  Administered 2016-12-14: 150 ug via INTRAVENOUS
  Administered 2016-12-14: 50 ug via INTRAVENOUS
  Administered 2016-12-14: 150 ug via INTRAVENOUS
  Administered 2016-12-14: 100 ug via INTRAVENOUS
  Administered 2016-12-14: 50 ug via INTRAVENOUS

## 2016-12-14 MED ORDER — FENTANYL CITRATE (PF) 250 MCG/5ML IJ SOLN
INTRAMUSCULAR | Status: AC
  Filled 2016-12-14: qty 5

## 2016-12-14 MED ORDER — BUPIVACAINE-EPINEPHRINE 0.25% -1:200000 IJ SOLN
INTRAMUSCULAR | Status: DC | PRN
  Administered 2016-12-14: 1 mL

## 2016-12-14 MED ORDER — HEMOSTATIC AGENTS (NO CHARGE) OPTIME
TOPICAL | Status: DC | PRN
  Administered 2016-12-14: 1 via TOPICAL

## 2016-12-14 MED ORDER — LACTATED RINGERS IV SOLN
INTRAVENOUS | Status: DC
  Administered 2016-12-14: 14:00:00 via INTRAVENOUS

## 2016-12-14 MED ORDER — SUCCINYLCHOLINE CHLORIDE 200 MG/10ML IV SOSY
PREFILLED_SYRINGE | INTRAVENOUS | Status: AC
  Filled 2016-12-14: qty 10

## 2016-12-14 MED ORDER — ONDANSETRON HCL 4 MG/2ML IJ SOLN: 4.0000 mg | Freq: Once | INTRAMUSCULAR | Status: DC | PRN

## 2016-12-14 MED ORDER — LACTATED RINGERS IV SOLN: INTRAVENOUS | Status: DC

## 2016-12-14 MED ORDER — ACETAMINOPHEN 10 MG/ML IV SOLN
INTRAVENOUS | Status: AC
  Filled 2016-12-14: qty 100

## 2016-12-14 MED ORDER — DEXTROSE 5 % IV SOLN
500.0000 mg | Freq: Four times a day (QID) | INTRAVENOUS | Status: DC | PRN
  Filled 2016-12-14: qty 5

## 2016-12-14 MED ORDER — PROPOFOL 10 MG/ML IV BOLUS
INTRAVENOUS | Status: DC | PRN
  Administered 2016-12-14: 120 mg via INTRAVENOUS

## 2016-12-14 MED ORDER — GELATIN ABSORBABLE 12-7 MM EX MISC
CUTANEOUS | Status: DC | PRN
  Administered 2016-12-14: 1

## 2016-12-14 MED ORDER — ONDANSETRON HCL 4 MG/2ML IJ SOLN
INTRAMUSCULAR | Status: DC | PRN
  Administered 2016-12-14: 4 mg via INTRAVENOUS

## 2016-12-14 MED ORDER — CEFAZOLIN SODIUM-DEXTROSE 2-4 GM/100ML-% IV SOLN
2.0000 g | Freq: Three times a day (TID) | INTRAVENOUS | Status: AC
  Administered 2016-12-15 (×2): 2 g via INTRAVENOUS
  Filled 2016-12-14 (×2): qty 100

## 2016-12-14 MED ORDER — LIDOCAINE 2% (20 MG/ML) 5 ML SYRINGE
INTRAMUSCULAR | Status: DC | PRN
  Administered 2016-12-14: 100 mg via INTRAVENOUS

## 2016-12-14 MED ORDER — SCOPOLAMINE 1 MG/3DAYS TD PT72
MEDICATED_PATCH | TRANSDERMAL | Status: DC | PRN
  Administered 2016-12-14: 1 via TRANSDERMAL

## 2016-12-14 MED ORDER — MIDAZOLAM HCL 2 MG/2ML IJ SOLN
INTRAMUSCULAR | Status: AC
  Filled 2016-12-14: qty 2

## 2016-12-14 MED ORDER — ONDANSETRON HCL 4 MG/2ML IJ SOLN
INTRAMUSCULAR | Status: AC
  Filled 2016-12-14: qty 2

## 2016-12-14 MED ORDER — ROCURONIUM BROMIDE 10 MG/ML (PF) SYRINGE
PREFILLED_SYRINGE | INTRAVENOUS | Status: DC | PRN
  Administered 2016-12-14: 20 mg via INTRAVENOUS
  Administered 2016-12-14 (×2): 50 mg via INTRAVENOUS

## 2016-12-14 MED ORDER — CEFAZOLIN SODIUM-DEXTROSE 2-4 GM/100ML-% IV SOLN
2.0000 g | INTRAVENOUS | Status: AC
  Administered 2016-12-14: 2 g via INTRAVENOUS
  Filled 2016-12-14: qty 100

## 2016-12-14 MED ORDER — MORPHINE SULFATE (PF) 4 MG/ML IV SOLN
2.0000 mg | INTRAVENOUS | Status: DC | PRN
  Administered 2016-12-14: 2 mg via INTRAVENOUS
  Filled 2016-12-14: qty 1

## 2016-12-14 MED ORDER — NITROGLYCERIN 0.4 MG SL SUBL
0.4000 mg | SUBLINGUAL_TABLET | SUBLINGUAL | Status: DC | PRN
Start: 2016-12-14 — End: 2016-12-15

## 2016-12-14 SURGICAL SUPPLY — 63 items
BUR EGG ELITE 4.0 (BURR) IMPLANT
BUR MATCHSTICK NEURO 3.0 LAGG (BURR) IMPLANT
CANISTER SUCT 3000ML PPV (MISCELLANEOUS) ×2 IMPLANT
CLSR STERI-STRIP ANTIMIC 1/2X4 (GAUZE/BANDAGES/DRESSINGS) ×2 IMPLANT
CORD BI POLAR (MISCELLANEOUS) ×2 IMPLANT
COVER SURGICAL LIGHT HANDLE (MISCELLANEOUS) ×2 IMPLANT
DRAIN CHANNEL 15F RND FF W/TCR (WOUND CARE) IMPLANT
DRAPE POUCH INSTRU U-SHP 10X18 (DRAPES) ×2 IMPLANT
DRAPE SURG 17X23 STRL (DRAPES) ×2 IMPLANT
DRAPE U-SHAPE 47X51 STRL (DRAPES) ×2 IMPLANT
DRSG AQUACEL AG ADV 3.5X 4 (GAUZE/BANDAGES/DRESSINGS) IMPLANT
DRSG AQUACEL AG ADV 3.5X 6 (GAUZE/BANDAGES/DRESSINGS) IMPLANT
DRSG OPSITE POSTOP 3X4 (GAUZE/BANDAGES/DRESSINGS) ×2 IMPLANT
DURAPREP 26ML APPLICATOR (WOUND CARE) ×2 IMPLANT
ELECT BLADE 4.0 EZ CLEAN MEGAD (MISCELLANEOUS)
ELECT CAUTERY BLADE 6.4 (BLADE) ×2 IMPLANT
ELECT PENCIL ROCKER SW 15FT (MISCELLANEOUS) ×2 IMPLANT
ELECT REM PT RETURN 9FT ADLT (ELECTROSURGICAL) ×2
ELECTRODE BLDE 4.0 EZ CLN MEGD (MISCELLANEOUS) IMPLANT
ELECTRODE REM PT RTRN 9FT ADLT (ELECTROSURGICAL) ×1 IMPLANT
EVACUATOR SILICONE 100CC (DRAIN) IMPLANT
GLOVE BIO SURGEON STRL SZ 6.5 (GLOVE) ×2 IMPLANT
GLOVE BIOGEL PI IND STRL 6.5 (GLOVE) ×1 IMPLANT
GLOVE BIOGEL PI IND STRL 8.5 (GLOVE) ×1 IMPLANT
GLOVE BIOGEL PI INDICATOR 6.5 (GLOVE) ×1
GLOVE BIOGEL PI INDICATOR 8.5 (GLOVE) ×1
GLOVE SS BIOGEL STRL SZ 8.5 (GLOVE) ×1 IMPLANT
GLOVE SUPERSENSE BIOGEL SZ 8.5 (GLOVE) ×1
GOWN STRL REUS W/TWL 2XL LVL3 (GOWN DISPOSABLE) ×4 IMPLANT
KIT BASIN OR (CUSTOM PROCEDURE TRAY) ×2 IMPLANT
KIT ROOM TURNOVER OR (KITS) ×2 IMPLANT
NEEDLE 22X1 1/2 (OR ONLY) (NEEDLE) ×2 IMPLANT
NEEDLE SPNL 18GX3.5 QUINCKE PK (NEEDLE) ×4 IMPLANT
NS IRRIG 1000ML POUR BTL (IV SOLUTION) ×2 IMPLANT
PACK LAMINECTOMY ORTHO (CUSTOM PROCEDURE TRAY) ×2 IMPLANT
PACK UNIVERSAL I (CUSTOM PROCEDURE TRAY) ×2 IMPLANT
PAD ARMBOARD 7.5X6 YLW CONV (MISCELLANEOUS) ×4 IMPLANT
PATTIES SURGICAL .5 X.5 (GAUZE/BANDAGES/DRESSINGS) IMPLANT
PATTIES SURGICAL .5 X1 (DISPOSABLE) ×2 IMPLANT
SPONGE SURGIFOAM ABS GEL 100 (HEMOSTASIS) IMPLANT
SURGIFLO W/THROMBIN 8M KIT (HEMOSTASIS) ×2 IMPLANT
SUT BONE WAX W31G (SUTURE) ×2 IMPLANT
SUT MON AB 3-0 SH 27 (SUTURE)
SUT MON AB 3-0 SH27 (SUTURE) IMPLANT
SUT PROLENE 2 0 CT2 30 (SUTURE) ×2 IMPLANT
SUT STRATAFIX 1PDS 45CM VIOLET (SUTURE) IMPLANT
SUT STRATAFIX MNCRL+ 3-0 PS-2 (SUTURE)
SUT STRATAFIX MONOCRYL 3-0 (SUTURE)
SUT STRATAFIX SPIRAL + 2-0 (SUTURE) IMPLANT
SUT VIC AB 0 CT1 27 (SUTURE) ×1
SUT VIC AB 0 CT1 27XBRD ANBCTR (SUTURE) ×1 IMPLANT
SUT VIC AB 1 CT1 18XCR BRD 8 (SUTURE) IMPLANT
SUT VIC AB 1 CT1 8-18 (SUTURE)
SUT VIC AB 1 CTX 36 (SUTURE)
SUT VIC AB 1 CTX36XBRD ANBCTR (SUTURE) IMPLANT
SUT VIC AB 2-0 CT1 18 (SUTURE) IMPLANT
SUTURE STRATFX MNCRL+ 3-0 PS-2 (SUTURE) IMPLANT
SYR BULB IRRIGATION 50ML (SYRINGE) ×2 IMPLANT
SYR CONTROL 10ML LL (SYRINGE) ×2 IMPLANT
TOWEL OR 17X24 6PK STRL BLUE (TOWEL DISPOSABLE) ×2 IMPLANT
TOWEL OR 17X26 10 PK STRL BLUE (TOWEL DISPOSABLE) ×2 IMPLANT
WATER STERILE IRR 1000ML POUR (IV SOLUTION) ×2 IMPLANT
YANKAUER SUCT BULB TIP NO VENT (SUCTIONS) ×2 IMPLANT

## 2016-12-14 NOTE — Progress Notes (Signed)
Inpatient Diabetes Program Recommendations  AACE/ADA: New Consensus Statement on Inpatient Glycemic Control (2015)  Target Ranges:  Prepandial:   less than 140 mg/dL      Peak postprandial:   less than 180 mg/dL (1-2 hours)      Critically ill patients:  140 - 180 mg/dL   Consult to follow patient with A1c and absence of outpatient treatment prior to surgery  Review of Glycemic Control  Diabetes history: DM 2 Outpatient Diabetes medications: Januvia 100 mg ordered waiting insurance approval, placed on this last PCP visit 8/1 Current orders for Inpatient glycemic control: None in OR area  Inpatient Diabetes Program Recommendations:    Patient w/history of DM 2 not on medication rpeviously went to see PCP for medical clearance for surgery decided to place patient on Januvia 100 mg Daily. Patient waiting on insurance clearance in order to take medication. Current A1c elevated 9.7%. Will need DM education while here. Will see patient tomorrow 8/9 to discuss diet and exercise mobility in addition to A1c and glucose goals.   Could place patient on Tradjenta (Januvia equivalent after procedure in addition to Novolog Sensitive Correction 0-9 units tid + Novolog HS scale 0-5 units.  Thanks,  Tama Headings RN, MSN, [facility] Inpatient Diabetes Coordinator Team Pager [phone] (8a-5p)

## 2016-12-14 NOTE — H&P (Signed)
History of Present Illness  The patient is a 36 year old female who comes in today for a preoperative History and Physical. The patient is scheduled for a L5-S1 decompression/discectomy to be performed by Dr. Duane Lope D. Rolena Infante, MD at [facility] on 12-14-16 . Please see the hospital record for complete dictated history and physical. Pt carries a diagnosis of DM2. Her A1c was 9.5. Medication was started. Pt has reported symptoms of urinary as well as fecal incontinence. Today she reports constipation has been present for the last few days.   Problem List/Past Medical  Swelling of toe of right foot (M79.89)  Perineal numbness (R20.0)  Problems Reconciled   Allergies No Known Drug Allergies  Allergies Reconciled   Family History  Cancer  Father, Maternal Grandfather, Maternal Grandmother, Paternal Grandfather, Paternal Grandmother. Diabetes Mellitus  Father, Maternal Grandfather, Paternal Grandfather, Paternal Grandmother. First Degree Relatives  reported Heart Disease  Father. Heart disease in female family member before age 23  Hypertension  Father.  Social History Children  1 Current work status  working full time Exercise  Exercises weekly; does running / walking and other Living situation  live alone Marital status  married Never consumed alcohol  10/14/2016: Never consumed alcohol No history of drug/alcohol rehab  Not under pain contract  Number of flights of stairs before winded  less than 1 Tobacco / smoke exposure  10/14/2016: yes Tobacco use  Never smoker. 10/14/2016  Medication History  DiazePAM (5MG  Tablet, Oral) Active. Diclofenac Sodium (50MG  Tablet DR, Oral) Active. Fluticasone Propionate (50MCG/ACT Suspension, Nasal) Active. AmLODIPine Besylate (5MG  Tablet, Oral) Active. Tylenol with Codeine #3 (300-30MG  Tablet, Oral) Active. Medications Reconciled  Physical Exam General General Appearance-Not in acute  distress. Orientation-Oriented X3. Build & Nutrition-Well nourished and Well developed.  Integumentary General Characteristics Surgical Scars - no surgical scar evidence of previous lumbar surgery. Lumbar Spine-Skin examination of the lumbar spine is without deformity, skin lesions, lacerations or abrasions.  Chest and Lung Exam Auscultation Breath sounds - Normal and Clear.  Cardiovascular Auscultation Rhythm - Regular rate and rhythm.  Abdomen Palpation/Percussion Palpation and Percussion of the abdomen reveal - Soft, Non Tender and No Rebound tenderness.  Peripheral Vascular Lower Extremity Palpation - Posterior tibial pulse - Bilateral - 2+. Dorsalis pedis pulse - Bilateral - 2+.  Neurologic Sensation Lower Extremity - Left - sensation is intact in the lower extremity. Right - sensation is diminished in the lower extremity. Reflexes Patellar Reflex - Bilateral - 2+. Achilles Reflex - Bilateral - 2+. Clonus - Bilateral - clonus not present. Hoffman's Sign - Bilateral - Hoffman's sign not present. Testing Seated Straight Leg Raise - Right - Seated straight leg raise positive.  Musculoskeletal Spine/Ribs/Pelvis  Lumbosacral Spine: Inspection and Palpation - Tenderness - left lumbar paraspinals tender to palpation and right lumbar paraspinals tender to palpation. Strength and Tone: Strength - Hip Flexion - Bilateral - 5/5. Knee Extension - Bilateral - 5/5. Knee Flexion - Bilateral - 5/5. Ankle Dorsiflexion - Bilateral - 4-/5. Ankle Plantarflexion - Bilateral - 5/5. Heel walk - Bilateral - able to heel walk with moderate difficulty. Toe Walk - Bilateral - able to walk on toes with moderate difficulty. ROM - Flexion - moderately decreased range of motion and painful. Extension - moderately decreased range of motion and painful. Left Lateral Bending - moderately decreased range of motion and painful. Right Lateral Bending - moderately decreased range of motion and painful.  Right Rotation - moderately decreased range of motion and painful. Left Rotation -  moderately decreased range of motion and painful. Pain - neither flexion or extension is more painful than the other. Lumbosacral Spine - Waddell's Signs - no Waddell's signs present. Lower Extremity Range of Motion - No true hip, knee or ankle pain with range of motion. Gait and Station - [employer] - no assistive devices. Rectal tone: absent.  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  Assessment & Plan  Goal Of Surgery: Discussed that goal of surgery is to reduce pain and improve function and quality of life. Patient is aware that despite all appropriate treatment that there pain and function could be the same, worse, or different. 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 Pt voiced understanding.

## 2016-12-14 NOTE — Brief Op Note (Signed)
12/14/2016  7:36 PM  PATIENT:  [name]  36 y.o. female  PRE-OPERATIVE DIAGNOSIS:  L5-S1 HNP  POST-OPERATIVE DIAGNOSIS:  L5-S1 HNP  PROCEDURE:  Procedure(s) with comments: LUMBAR DECOMPRESSION MICRODISCECTOMY L5-S1 (N/A) - 120 mins  SURGEON:  Surgeon(s) and Role:    Melina Schools, MD - Primary  PHYSICIAN ASSISTANT:   ASSISTANTS: Carmen Mayo   ANESTHESIA:   general  EBL:  Total I/O In: -  Out: 100 [Urine:50; Blood:50]  BLOOD ADMINISTERED:none  DRAINS: none   LOCAL MEDICATIONS USED:  MARCAINE     SPECIMEN:  No Specimen  DISPOSITION OF SPECIMEN:  N/A  COUNTS:  YES  TOURNIQUET:  * No tourniquets in log *  DICTATION: .Dragon Dictation  PLAN OF CARE: Admit for overnight observation  PATIENT DISPOSITION:  PACU - hemodynamically stable.

## 2016-12-14 NOTE — Anesthesia Procedure Notes (Signed)
Procedure Name: Intubation Date/Time: 12/14/2016 5:45 PM Performed by: Teressa Lower Pre-anesthesia Checklist: Patient identified, Emergency Drugs available, Suction available and Patient being monitored Patient Re-evaluated:Patient Re-evaluated prior to induction Oxygen Delivery Method: Circle system utilized Preoxygenation: Pre-oxygenation with 100% oxygen Induction Type: IV induction Ventilation: Mask ventilation without difficulty Laryngoscope Size: Mac and 3 Grade View: Grade II Tube type: Oral Tube size: 7.5 mm Number of attempts: 1 Airway Equipment and Method: Stylet and Oral airway Placement Confirmation: ETT inserted through vocal cords under direct vision,  positive ETCO2 and breath sounds checked- equal and bilateral Secured at: 22 cm Tube secured with: Tape Dental Injury: Teeth and Oropharynx as per pre-operative assessment

## 2016-12-14 NOTE — Anesthesia Postprocedure Evaluation (Signed)
Anesthesia Post Note  Patient: [name]  Procedure(s) Performed: Procedure(s) (LRB): LUMBAR DECOMPRESSION MICRODISCECTOMY L5-S1 (N/A)     Patient location during evaluation: PACU Anesthesia Type: General Level of consciousness: awake and alert Pain management: pain level controlled Vital Signs Assessment: post-procedure vital signs reviewed and stable Respiratory status: spontaneous breathing, nonlabored ventilation, respiratory function stable and patient connected to nasal cannula oxygen Cardiovascular status: blood pressure returned to baseline and stable Postop Assessment: no signs of nausea or vomiting Anesthetic complications: no    Last Vitals:  Vitals:   12/14/16 2130 12/14/16 2145  BP: 110/64 117/68  Pulse: 69 79  Resp: 19 20  Temp:  (!) 36.3 C    Last Pain:  Vitals:   12/14/16 2115  TempSrc:   PainSc: 8                  Ryan P Ellender

## 2016-12-14 NOTE — Op Note (Signed)
Operative note.  Preoperative diagnosis. 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  Postoperative diagnosis. Same.  Operative findings.. Lumbar decompression L5-S1 with excision of large central and posterior lateral to the right disc herniation. Operative findings. Large central and right-sided disc herniation with marked compression of the S1 nerve root in the lateral recess and neural foramen. Performed a generous central decompression to aid in recovery of the sacral nerve root function (cauda equina syndrome) removed the large fragment of disc material consistent with preoperative MRI imaging.  First Environmental consultant. [facility].  Complications. None. No CSF leak noted at the conclusion of the case.  Indications. This is a very pleasant 36 year old woman with a two-month history of severe radicular right leg pain back pain and incontinence of bowel and bladder. Imaging studies demonstrated a large central disc herniation with severe spinal stenosis as well as right-sided foraminal and lateral recess stenosis. Because of the severity of the symptoms the patient elected to proceed with surgery. Appropriate preoperative medical clearance was obtained given the fact that she was a noncompliant diabetic.  Operative note. Patient was brought the operating room placed supine the operating table. After successful induction of general anesthesia and endotracheal intubation teds SCDs and a Foley were inserted.  Patient was turned prone onto the Wilson frame and all bony prominences well-padded and the back was prepped and draped in a standard fashion. Timeout was taken to confirm patient procedure and all other important data.  2 needles were then placed into the back and x-ray was taken and I localized the area for skin incision the skin incision was marked and infiltrated with quarter percent Marcaine. Midline incision was made  centered over the L5-S1 disc space and sharp dissection was carried out through the adipose. Tissue down to the deep fascia. Self-retaining retractors were placed and I incised the deep fascia. I stripped the paraspinal muscles to expose the  L5, S1 spinous processes and lamina. Self-retaining deep retractor was placed and had excellent exposure the posterior aspect of spine. Penfield 4 was placed under the L5 lamina and a second x-ray was taken to confirm the appropriate level. Once this was done double-action Leksell rongeur was used to remove the majority of the L5 spinous process. Because of the severe central stenosis and a cauda equina syndrome I elected to move forward with the central decompression to give her the best chance of resolving a cauda equina syndrome at generous laminotomy was performed using a 2 and 3 mm Kerrison rongeur once the central decompression was completed I went into the left lateral recess and decompressed this area identify the S1 nerve root traced towards its foramen to ensure was adequately decompress this also allowed me to create room for retraction on the right side. In the lateral recess the S1 nerve root was completely decompressed against the posterior aspect of the vertebral body and pedicle. It was marked compression of it with a large central disc herniation appearing in the axilla of the S1 nerve root. Once I I then used a Kerrison rongeur to resect the superior portion the S1 lamina so I had adequate central decompression I now think it cranial and caudal to the disc fragment. Using a nerve hook I gently swept until I was able to deliver 3 very large fragments of disc material out of the wound. Once this was completed the S1 nerve root was now clearly visualized and was no longer under and under  compression. To ensure the best chance at recovery I decompressed out to the medial wall the pedicle and traced the nerve root into the foramen. At this point I now had a  complete central decompression and right lateral recess and foraminal decompression and complete discectomy. There was a large portion of the disc that had herniated out. I was able to freely pass the [facility] elevator all the way over to the contralateral side pass it along the S1 nerve root into the foramen and superiorly in the lateral recess and inferiorly. At this point I was very pleased with my decompression and discectomy.  Hemostasis was then obtained using bipolar cautery and FloSeal. The wound was copiously irrigated with normal saline and then I closed the deep fascia with interrupted #1 Vicryl sutures I then closed in a layered fashion with 0 Vicryl, 2-0 Vicryl, and a 2-0 Prolene. Steri-Strips and a dry dressing were applied and the patient was ultimately extubated transferred the PACU that incident. At the end of the case all needle and sponge counts were correct. There were no adverse intraoperative events.

## 2016-12-14 NOTE — Transfer of Care (Signed)
Immediate Anesthesia Transfer of Care Note  Patient: [name]  Procedure(s) Performed: Procedure(s) with comments: LUMBAR DECOMPRESSION MICRODISCECTOMY L5-S1 (N/A) - 120 mins  Patient Location: PACU  Anesthesia Type:General  Level of Consciousness: awake, alert  and oriented  Airway & Oxygen Therapy: Patient Spontanous Breathing and Patient connected to face mask oxygen  Post-op Assessment: Report given to RN and Post -op Vital signs reviewed and stable  Post vital signs: Reviewed and stable  Last Vitals:  Vitals:   12/14/16 1334  BP: 135/75  Pulse: 71  Resp: 18  Temp: 36.9 C    Last Pain:  Vitals:   12/14/16 1354  TempSrc:   PainSc: 10-Worst pain ever         Complications: No apparent anesthesia complications

## 2016-12-14 NOTE — Anesthesia Preprocedure Evaluation (Addendum)
Anesthesia Evaluation  Patient identified by MRN, date of birth, ID band Patient awake    Reviewed: Allergy & Precautions, NPO status , Patient's Chart, lab work & pertinent test results  History of Anesthesia Complications (+) PONVNegative for: history of anesthetic complications  Airway Mallampati: II  TM Distance: >3 FB Neck ROM: Full    Dental no notable dental hx.    Pulmonary neg pulmonary ROS,    Pulmonary exam normal breath sounds clear to auscultation       Cardiovascular negative cardio ROS Normal cardiovascular exam Rhythm:Regular Rate:Normal     Neuro/Psych negative neurological ROS  negative psych ROS   GI/Hepatic negative GI ROS, Neg liver ROS,   Endo/Other  diabetes, Type 2, Oral Hypoglycemic AgentsMorbid obesity  Renal/GU negative Renal ROS  negative genitourinary   Musculoskeletal negative musculoskeletal ROS (+)   Abdominal   Peds negative pediatric ROS (+)  Hematology negative hematology ROS (+)   Anesthesia Other Findings   Reproductive/Obstetrics negative OB ROS                            Anesthesia Physical Anesthesia Plan  ASA: III  Anesthesia Plan: General   Post-op Pain Management:    Induction: Intravenous  PONV Risk Score and Plan: 4 or greater and Ondansetron, Dexamethasone, Midazolam, Scopolamine patch - Pre-op and Treatment may vary due to age or medical condition  Airway Management Planned: Oral ETT  Additional Equipment:   Intra-op Plan: Utilization of Controlled Hypotension per surrgeon request  Post-operative Plan: Extubation in OR  Informed Consent: I have reviewed the patients History and Physical, chart, labs and discussed the procedure including the risks, benefits and alternatives for the proposed anesthesia with the patient or authorized representative who has indicated his/her understanding and acceptance.   Dental advisory  given  Plan Discussed with: CRNA and Surgeon  Anesthesia Plan Comments:        Anesthesia Quick Evaluation

## 2016-12-15 ENCOUNTER — Encounter (HOSPITAL_COMMUNITY): Payer: Self-pay | Admitting: Orthopedic Surgery

## 2016-12-15 DIAGNOSIS — G834 Cauda equina syndrome: Secondary | ICD-10-CM | POA: Diagnosis not present

## 2016-12-15 DIAGNOSIS — Z7951 Long term (current) use of inhaled steroids: Secondary | ICD-10-CM | POA: Diagnosis not present

## 2016-12-15 DIAGNOSIS — Z9119 Patient's noncompliance with other medical treatment and regimen: Secondary | ICD-10-CM | POA: Diagnosis not present

## 2016-12-15 DIAGNOSIS — Z79899 Other long term (current) drug therapy: Secondary | ICD-10-CM | POA: Diagnosis not present

## 2016-12-15 DIAGNOSIS — Z7982 Long term (current) use of aspirin: Secondary | ICD-10-CM | POA: Diagnosis not present

## 2016-12-15 DIAGNOSIS — M5106 Intervertebral disc disorders with myelopathy, lumbar region: Secondary | ICD-10-CM | POA: Diagnosis not present

## 2016-12-15 DIAGNOSIS — M5117 Intervertebral disc disorders with radiculopathy, lumbosacral region: Secondary | ICD-10-CM | POA: Diagnosis not present

## 2016-12-15 DIAGNOSIS — Z7984 Long term (current) use of oral hypoglycemic drugs: Secondary | ICD-10-CM | POA: Diagnosis not present

## 2016-12-15 DIAGNOSIS — E119 Type 2 diabetes mellitus without complications: Secondary | ICD-10-CM | POA: Diagnosis not present

## 2016-12-15 DIAGNOSIS — Z6839 Body mass index (BMI) 39.0-39.9, adult: Secondary | ICD-10-CM | POA: Diagnosis not present

## 2016-12-15 LAB — GLUCOSE, CAPILLARY
Glucose-Capillary: 226 mg/dL — ABNORMAL HIGH (ref 65–99)
Glucose-Capillary: 222 mg/dL — ABNORMAL HIGH (ref 65–99)

## 2016-12-15 LAB — HEMOGLOBIN A1C
HEMOGLOBIN A1C: 9.5 % — AB (ref 4.8–5.6)
Mean Plasma Glucose: 225.95 mg/dL

## 2016-12-15 MED ORDER — ONDANSETRON 4 MG PO TBDP: 4.0000 mg | ORAL_TABLET | Freq: Three times a day (TID) | ORAL | 0 refills | Status: DC | PRN

## 2016-12-15 MED ORDER — METHOCARBAMOL 500 MG PO TABS: 500.0000 mg | ORAL_TABLET | Freq: Three times a day (TID) | ORAL | 0 refills | Status: DC

## 2016-12-15 MED ORDER — OXYCODONE-ACETAMINOPHEN 5-325 MG PO TABS
1.0000 | ORAL_TABLET | ORAL | Status: DC | PRN
  Administered 2016-12-15 (×3): 2 via ORAL
  Filled 2016-12-15 (×3): qty 2

## 2016-12-15 MED ORDER — OXYCODONE-ACETAMINOPHEN 10-325 MG PO TABS: 1.0000 | ORAL_TABLET | ORAL | 0 refills | Status: AC | PRN

## 2016-12-15 NOTE — Discharge Instructions (Signed)
Laminectomy, Care After °This sheet gives you information about how to care for yourself after your procedure. Your health care provider may also give you more specific instructions. If you have problems or questions, contact your health care provider. °What can I expect after the procedure? °After the procedure, it is common to have: °· Some pain around your incision area. °· Muscle tightening (spasms) across the back. ° °Follow these instructions at home: °Incision care °· Follow instructions from your health care provider about how to take care of your incision area. Make sure you: °? Wash your hands with soap and water before and after you apply medicine to the area or change your bandage (dressing). If soap and water are not available, use hand sanitizer. °? Change your dressing as told by your health care provider. °? Leave stitches (sutures), skin glue, or adhesive strips in place. These skin closures may need to stay in place for 2 weeks or longer. If adhesive strip edges start to loosen and curl up, you may trim the loose edges. Do not remove adhesive strips completely unless your health care provider tells you to do that. °· Check your incision area every day for signs of infection. Check for: °? More redness, swelling, or pain. °? More fluid or blood. °? Warmth. °? Pus or a bad smell. °Medicines °· Take over-the-counter and prescription medicines only as told by your health care provider. °· If you were prescribed an antibiotic medicine, use it as told by your health care provider. Do not stop using the antibiotic even if you start to feel better. °Bathing °· Do not take baths, swim, or use a hot tub for 2 weeks, or until your incision has healed completely. °· If your health care provider approves, you may take showers after your dressing has been removed. °Activity °· Return to your normal activities as told by your health care provider. Ask your health care provider what activities are safe for  you. °· Avoid bending or twisting at your waist. Always bend at your knees. °· Do not sit for more than 20-30 minutes at a time. Lie down or walk between periods of sitting. °· Do not lift anything that is heavier than 10 lb (4.5 kg) or the limit that your health care provider tells you, until he or she says that it is safe. °· Do not drive for 2 weeks after your procedure or for as long as your health care provider tells you. °· Do not drive or use heavy machinery while taking prescription pain medicine. °General instructions °· To prevent or treat constipation while you are taking prescription pain medicine, your health care provider may recommend that you: °? Drink enough fluid to keep your urine clear or pale yellow. °? Take over-the-counter or prescription medicines. °? Eat foods that are high in fiber, such as fresh fruits and vegetables, whole grains, and beans. °? Limit foods that are high in fat and processed sugars, such as fried and sweet foods. °· Do breathing exercises as told. °· Keep all follow-up visits as told by your health care provider. This is important. °Contact a health care provider if: °· You have more redness, swelling, or pain around your incision area. °· Your incision feels warm to the touch. °· You are not able to return to activities or do exercises as told by your health care provider. °Get help right away if: °· You have: °? More fluid or blood coming from your incision area. °? Pus or   a bad smell coming from your incision area. °? Chills or a fever. °? Episodes of dizziness or fainting while standing. °· You develop a rash. °· You develop shortness of breath or you have difficulty breathing. °· You cannot control when you urinate or have a bowel movement. °· You become weak. °· You are not able to use your legs. °Summary °· After the procedure, it is common to have some pain around your incision area. You may also have muscle tightening (spasms) across the back. °· Follow  instructions from your health care provider about how to care for your incision. °· Do not lift anything that is heavier than 10 lb (4.5 kg) or the limit that your health care provider tells you, until he or she says that it is safe. °· Contact your health care provider if you have more redness, swelling, or pain around your incision area or if your incision feels warm to the touch. These can be signs of infection. °This information is not intended to replace advice given to you by your health care provider. Make sure you discuss any questions you have with your health care provider. °Document Released: 11/12/2004 Document Revised: 12/10/2015 Document Reviewed: 10/11/2015 °Elsevier Interactive Patient Education © 2018 [employer] ° °

## 2016-12-15 NOTE — Progress Notes (Signed)
Inpatient Diabetes Program Recommendations  AACE/ADA: New Consensus Statement on Inpatient Glycemic Control (2015)  Target Ranges:  Prepandial:   less than 140 mg/dL      Peak postprandial:   less than 180 mg/dL (1-2 hours)      Critically ill patients:  140 - 180 mg/dL   Lab Results  Component Value Date   GLOVFI 433 (H) 12/15/2016   HGBA1C 9.5 (H) 12/14/2016   Spoke with patient about diabetes and home regimen for diabetes control. Patient reports that she has an appointment on August 31st to establish care at [facility] urgent care. Patient has 2 insurances and one is still pending Januvia for DM management. Patient states the doctors was hoping the surgery would help her bowels to be able to tolerate the side effects of the metformin she was once taking. Patient reports seeing Dr. Buddy Duty Endocrinologist in the past and was taking metformin and at one time on samples of Victoza. Spoke with patient about current A1c levels and the need for glucose control for wound healing. Patient reports non compliance with CBG checks and diet at home eating and drinking whatever she wants and not taking her DM seriously. Counseled patient on diet choices and portion sizes.  Encouraged patient to check glucose 2 times per days. Patient verbalized understanding of information discussed and has no further questions at this time related to diabetes.   Thanks,  Tama Headings RN, MSN, [facility] Inpatient Diabetes Coordinator Team Pager [phone] (8a-5p)

## 2016-12-15 NOTE — Progress Notes (Signed)
Patient is discharged from room 3C11 at this time. Alert and in stable condition. IV site d/c'd and instructions read to patient with understanding verbalized. Left unit via wheelchair with mom and all belongings at side.

## 2016-12-15 NOTE — Evaluation (Signed)
Physical Therapy Evaluation Patient Details Name: [name] MRN: [id number] DOB: 13-Jan-1981 Today's Date: 12/15/2016   History of Present Illness  Pt is a 36 y/o female who presents s/p L5-S1 laminectomy/decompression on 12/14/16. PMH significant for DM and carpal tunnel syndrome.   Clinical Impression  Pt admitted with above diagnosis. Pt currently with functional limitations due to the deficits listed below (see PT Problem List). At the time of PT eval pt was significantly limited by pain and anxiety. Was able to complete 1 step training however unable to complete multiple stairs 2 pain. Would benefit from another PT session prior to d/c. Pt will benefit from skilled PT to increase their independence and safety with mobility to allow discharge to the venue listed below.       Follow Up Recommendations Home health PT;Supervision for mobility/OOB    Equipment Recommendations  Rolling walker with 5" wheels;3in1 (PT)    Recommendations for Other Services       Precautions / Restrictions Precautions Precautions: Fall;Back Precaution Booklet Issued: Yes (comment) Precaution Comments: Reviewed precautions with pt and mother. Pt was cued for maintenance of precautions throughout functional mobility.  Required Braces or Orthoses: Spinal Brace Spinal Brace: Lumbar corset;Applied in sitting position Restrictions Weight Bearing Restrictions: No      Mobility  Bed Mobility Overal bed mobility: Needs Assistance Bed Mobility: Rolling;Sidelying to Sit Rolling: Min assist Sidelying to sit: Min assist       General bed mobility comments: Assist for proper technique and trunk elevation to full sitting position.   Transfers Overall transfer level: Needs assistance   Transfers: Sit to/from Stand Sit to Stand: Min assist         General transfer comment: Steadying assist as pt powered up to full standing position. No AD initially.  Ambulation/Gait Ambulation/Gait assistance: Min  guard Ambulation Distance (Feet): 150 Feet Assistive device: Rolling walker (2 wheeled) Gait Pattern/deviations: Step-through pattern;Decreased stride length;Trunk flexed Gait velocity: Decreased Gait velocity interpretation: Below normal speed for age/gender General Gait Details: VC's for improved posture, sequencing and general safety with the RW.   Stairs Stairs: Yes Stairs assistance: Min assist Stair Management: No rails;Backwards;With walker Number of Stairs: 1 General stair comments: VC's to ascend stairs with RW as pt does not have railings at home. Pt unable to tolerate more than 1 step 2 pain.  Wheelchair Mobility    Modified Rankin (Stroke Patients Only)       Balance Overall balance assessment: Needs assistance Sitting-balance support: Feet supported Sitting balance-Leahy Scale: Fair     Standing balance support: No upper extremity supported Standing balance-Leahy Scale: Poor Standing balance comment: Requires support to maintain standing balance                             Pertinent Vitals/Pain Pain Assessment: Faces Faces Pain Scale: Hurts whole lot Pain Location: back Pain Descriptors / Indicators: Operative site guarding;Discomfort Pain Intervention(s): Limited activity within patient's tolerance;Monitored during session;Repositioned    Home Living Family/patient expects to be discharged to:: Private residence Living Arrangements: Alone (per pt report) Available Help at Discharge: Family;Available PRN/intermittently Type of Home: House Home Access: Stairs to enter Entrance Stairs-Rails: None Entrance Stairs-Number of Steps: 2 Home Layout: Two level;Able to live on main level with bedroom/bathroom Home Equipment: None      Prior Function Level of Independence: Independent         Comments: Appears that mother was assisting occasionally PTA. Reports difficulty  getting to her feet     Hand Dominance        Extremity/Trunk  Assessment   Upper Extremity Assessment Upper Extremity Assessment: Overall WFL for tasks assessed    Lower Extremity Assessment Lower Extremity Assessment: RLE deficits/detail RLE Deficits / Details: Pt reports pain and numbness    Cervical / Trunk Assessment Cervical / Trunk Assessment: Other exceptions Cervical / Trunk Exceptions: s/p surgery  Communication   Communication: No difficulties  Cognition Arousal/Alertness: Awake/alert Behavior During Therapy: Anxious Overall Cognitive Status: Within Functional Limits for tasks assessed                                        General Comments      Exercises     Assessment/Plan    PT Assessment Patient needs continued PT services  PT Problem List Decreased strength;Decreased range of motion;Decreased activity tolerance;Decreased balance;Decreased mobility;Decreased knowledge of use of DME;Decreased safety awareness;Decreased knowledge of precautions;Pain       PT Treatment Interventions DME instruction;Gait training;Stair training;Functional mobility training;Therapeutic activities;Therapeutic exercise;Neuromuscular re-education;Patient/family education    PT Goals (Current goals can be found in the Care Plan section)  Acute Rehab PT Goals Patient Stated Goal: Decrease pain PT Goal Formulation: With patient/family Time For Goal Achievement: 12/22/16 Potential to Achieve Goals: Good    Frequency Min 5X/week   Barriers to discharge        Co-evaluation               AM-PAC PT "6 Clicks" Daily Activity  Outcome Measure Difficulty turning over in bed (including adjusting bedclothes, sheets and blankets)?: A Little Difficulty moving from lying on back to sitting on the side of the bed? : Total Difficulty sitting down on and standing up from a chair with arms (e.g., wheelchair, bedside commode, etc,.)?: A Little Help needed moving to and from a bed to chair (including a wheelchair)?: A Little Help  needed walking in hospital room?: A Little Help needed climbing 3-5 steps with a railing? : A Lot 6 Click Score: 15    End of Session Equipment Utilized During Treatment: Gait belt Activity Tolerance: Patient limited by pain Patient left: in chair;with call bell/phone within reach Nurse Communication: Mobility status PT Visit Diagnosis: Unsteadiness on feet (R26.81);Muscle weakness (generalized) (M62.81);Other symptoms and signs involving the nervous system (R29.898);Pain Pain - part of body:  (back)    Time: 3149-7026 PT Time Calculation (min) (ACUTE ONLY): 22 min   Charges:   PT Evaluation $PT Eval Moderate Complexity: 1 Mod     PT G Codes:   PT G-Codes **NOT FOR INPATIENT CLASS** Functional Assessment Tool Used: Clinical judgement Functional Limitation: Mobility: Walking and moving around Mobility: Walking and Moving Around Current Status ([id number]): At least 40 percent but less than 60 percent impaired, limited or restricted Mobility: Walking and Moving Around Goal Status [phone]): At least 20 percent but less than 40 percent impaired, limited or restricted    Rolinda Roan, PT, DPT Acute Rehabilitation Services Pager: [employer] 12/15/2016, 1:26 PM

## 2016-12-16 MED FILL — Thrombin For Soln 20000 Unit: CUTANEOUS | Qty: 1 | Status: AC

## 2016-12-19 ENCOUNTER — Other Ambulatory Visit: Payer: Self-pay | Admitting: *Deleted

## 2016-12-19 MED ORDER — GLIPIZIDE ER 5 MG PO TB24: 5.0000 mg | ORAL_TABLET | Freq: Every day | ORAL | 1 refills | Status: DC

## 2016-12-19 NOTE — Addendum Note (Signed)
Addended by: Wardell Honour on: 12/19/2016 09:36 AM   Modules accepted: Orders

## 2016-12-19 NOTE — Telephone Encounter (Signed)
Recommend Glipizide ER instead of Januvia.  Rx sent to pharmacy. Please call to advise patient that insurance denied Januvia.  It is very very important that she always eat when she takes Glipizide.

## 2016-12-19 NOTE — Telephone Encounter (Signed)
I see that patient had her surgery on 12/14/16.  Pain medication causes constipation.  Metformin may help with her constipation. She can start taking one Metformin daily in addition to one Glipizide daily. Does she have a glucometer?  She will definitely need to check her sugars daily.

## 2016-12-19 NOTE — Telephone Encounter (Signed)
Advised patient about Glipizide.    Patient also wanted to let you know she hasn't had a bowel movement in a week.  She wanted to know if she should take Metformin also she said it was discussed .

## 2016-12-19 NOTE — Discharge Summary (Signed)
Physician Discharge Summary  Patient ID: [name] MRN: [id number] DOB/AGE: [birth year] 36 y.o.  Admit date: 12/14/2016 Discharge date: 12/19/2016  Admission Diagnoses:  Lumbar Disc Herniation  Discharge Diagnoses:  Active Problems:   Lumbar disc herniation   Past Medical History:  Diagnosis Date  . Back pain   . Carpal tunnel syndrome    bilateral  . Complication of anesthesia    "hard to wake up"  . Diabetes mellitus without complication ([place])   . Dyspnea    with excersion  . Family history of adverse reaction to anesthesia    mom voniting  . Obesity   . PONV (postoperative nausea and vomiting)   . Tricuspid valve regurgitation    also mitral regurg- trace on both per echo    Surgeries: Procedure(s): LUMBAR DECOMPRESSION MICRODISCECTOMY L5-S1 on 12/14/2016   Consultants (if any):   Discharged Condition: Improved  Hospital Course: [name] is an 35 y.o. female who was admitted 12/14/2016 with a diagnosis of Lumbar disc herniation and went to the operating room on 12/14/2016 and underwent the above named procedures.  Post op day one pt pt speaks with Diabetes Nurse about her non compliance in regards to managing her Diabetes.  The pt tells her she has restarted a regimen before the surgery and has a f/u the end of August.  Pt worked with PT prior to DC and was cleared to go home.   She was given perioperative antibiotics:  Anti-infectives    Start     Dose/Rate Route Frequency Ordered Stop   12/15/16 0200  ceFAZolin (ANCEF) IVPB 2g/100 mL premix     2 g 200 mL/hr over 30 Minutes Intravenous Every 8 hours 12/14/16 2218 12/15/16 0849   12/14/16 0859  ceFAZolin (ANCEF) 2-4 GM/100ML-% IVPB  Status:  Discontinued    Comments:  Tamsen Snider   : cabinet override      12/14/16 0859 12/14/16 0903   12/14/16 0851  ceFAZolin (ANCEF) IVPB 2g/100 mL premix     2 g 200 mL/hr over 30 Minutes Intravenous 30 min pre-op 12/14/16 0851 12/14/16 1759    .  She was given  sequential compression devices, early ambulation, and TED for DVT prophylaxis.  She benefited maximally from the hospital stay and there were no complications.    Recent vital signs:  Vitals:   12/15/16 0750 12/15/16 1215  BP: 119/67 115/89  Pulse: 77 84  Resp: 18 18  Temp: 99.5 F (37.5 C) 99.1 F (37.3 C)  SpO2: 98% 96%    Recent laboratory studies:  Lab Results  Component Value Date   HGB 14.1 12/12/2016   HGB 13.7 12/07/2016   HGB 13.2 09/08/2015   Lab Results  Component Value Date   WBC 9.3 12/12/2016   PLT 359 12/12/2016   No results found for: INR Lab Results  Component Value Date   NA 139 12/12/2016   K 3.8 12/12/2016   CL 101 12/12/2016   CO2 26 12/12/2016   BUN 9 12/12/2016   CREATININE 0.54 12/12/2016   GLUCOSE 266 (H) 12/12/2016    Discharge Medications:   Allergies as of 12/15/2016      Reactions   Latex Rash      Medication List    STOP taking these medications   acetaminophen 500 MG tablet Commonly known as:  TYLENOL   cyclobenzaprine 10 MG tablet Commonly known as:  FLEXERIL   naproxen 500 MG tablet Commonly known as:  NAPROSYN  TAKE these medications   albuterol 108 (90 Base) MCG/ACT inhaler Commonly known as:  PROVENTIL HFA;VENTOLIN HFA Inhale 2 puffs into the lungs every 6 (six) hours as needed for wheezing or shortness of breath.   aspirin 81 MG tablet Take 81 mg by mouth daily.   diazepam 5 MG tablet Commonly known as:  VALIUM Take 1 tablet (5 mg total) by mouth every 12 (twelve) hours as needed for anxiety.   diclofenac 50 MG EC tablet Commonly known as:  VOLTAREN Take 1 tablet (50 mg total) by mouth 2 (two) times daily.   fluconazole 200 MG tablet Commonly known as:  DIFLUCAN Take 200 mg by mouth every other day.   fluticasone 50 MCG/ACT nasal spray Commonly known as:  FLONASE INSTILL 2 PUFFS INTO EACH NOSTRIL EVERY NIGHT AS NEEDED FOR ALLERGIES   methocarbamol 500 MG tablet Commonly known as:  ROBAXIN Take 1  tablet (500 mg total) by mouth 3 (three) times daily.   nitroGLYCERIN 0.4 MG SL tablet Commonly known as:  NITROSTAT Place 0.4 mg under the tongue every 5 (five) minutes as needed for chest pain.   ondansetron 4 MG disintegrating tablet Commonly known as:  ZOFRAN ODT Take 1 tablet (4 mg total) by mouth every 8 (eight) hours as needed for nausea or vomiting.   oxyCODONE-acetaminophen 10-325 MG tablet Commonly known as:  PERCOCET Take 1 tablet by mouth every 4 (four) hours as needed for pain.   sitaGLIPtin 100 MG tablet Commonly known as:  JANUVIA Take 1 tablet (100 mg total) by mouth daily.   UNABLE TO FIND Apply 1 application topically 4 (four) times daily as needed. analgesic pain cream       Diagnostic Studies: Dg Lumbar Spine 2-3 Views  Result Date: 12/14/2016 CLINICAL DATA:  36 y/o F; microdiskectomy and decompression of L5-S1. EXAM: LUMBAR SPINE - 2-3 VIEW COMPARISON:  09/08/2016 lumbar spine radiographs FINDINGS: Three cross-table lateral intraoperative radiographs of the lumbar spine. The third film demonstrates the operative probe at the L5-S1 intervertebral disc level. IMPRESSION: Intraoperative radiographs of microdiscectomy in decompression of L5-S1. These results were called by telephone at the time of interpretation on 12/14/2016 at 6:35 pm to Dr. Melina Schools , who verbally acknowledged these results. Electronically Signed   By: Kristine Garbe M.D.   On: 12/14/2016 18:35    Disposition: 01-Home or Self Care Pt will present to clinic in 2 weeks Pt was provided with Post op medication      Signed: Valinda Hoar 12/19/2016, 1:12 PM

## 2016-12-23 NOTE — Telephone Encounter (Signed)
Constipation resolved. Pt states she is feeling better and advised to take CBG every day.

## 2017-01-06 ENCOUNTER — Ambulatory Visit: Payer: BLUE CROSS/BLUE SHIELD | Admitting: Emergency Medicine

## 2017-01-18 DIAGNOSIS — Z01419 Encounter for gynecological examination (general) (routine) without abnormal findings: Secondary | ICD-10-CM | POA: Diagnosis not present

## 2017-01-18 DIAGNOSIS — Z113 Encounter for screening for infections with a predominantly sexual mode of transmission: Secondary | ICD-10-CM | POA: Diagnosis not present

## 2017-01-18 DIAGNOSIS — Z6839 Body mass index (BMI) 39.0-39.9, adult: Secondary | ICD-10-CM | POA: Diagnosis not present

## 2017-01-18 DIAGNOSIS — N760 Acute vaginitis: Secondary | ICD-10-CM | POA: Diagnosis not present

## 2017-01-25 ENCOUNTER — Encounter: Payer: Self-pay | Admitting: Gastroenterology

## 2017-03-20 ENCOUNTER — Ambulatory Visit (INDEPENDENT_AMBULATORY_CARE_PROVIDER_SITE_OTHER): Payer: Federal, State, Local not specified - PPO | Admitting: Gastroenterology

## 2017-03-20 ENCOUNTER — Encounter: Payer: Self-pay | Admitting: Gastroenterology

## 2017-03-20 ENCOUNTER — Encounter (INDEPENDENT_AMBULATORY_CARE_PROVIDER_SITE_OTHER): Payer: Self-pay

## 2017-03-20 ENCOUNTER — Encounter: Payer: Self-pay | Admitting: Neurology

## 2017-03-20 VITALS — BP 118/68 | HR 72 | Ht 68.0 in | Wt 266.2 lb

## 2017-03-20 DIAGNOSIS — Z4789 Encounter for other orthopedic aftercare: Secondary | ICD-10-CM | POA: Diagnosis not present

## 2017-03-20 DIAGNOSIS — K921 Melena: Secondary | ICD-10-CM

## 2017-03-20 DIAGNOSIS — K5900 Constipation, unspecified: Secondary | ICD-10-CM | POA: Diagnosis not present

## 2017-03-20 DIAGNOSIS — R159 Full incontinence of feces: Secondary | ICD-10-CM

## 2017-03-20 NOTE — Progress Notes (Signed)
History of Present Illness: This is a 36 year old female referred by Melina Schools, MD for the evaluation of fecal incontinence and hematochezia. She developed right leg pain and incontinence of bowel and bladder 3 months ago that to lead to a lumbar decompression, L5-S1, on 12/14/2016. Marked S1 nerve root compression was noted at surgery. Bowel incontinence has persisted.  Bladder incontinence has persisted but has improved.  She states she has a bowel movement about every 3-4 days.  She does not have control of her bowel movements.  She states gas passes frequently without her being aware. She notes small amounts of bright red per rectum with some bowel movements for about the past 2 months. Denies weight loss, abdominal pain, diarrhea, change in stool caliber, melena, nausea, vomiting, dysphagia, reflux symptoms, chest pain.   Allergies  Allergen Reactions  . Latex Rash   Outpatient Medications Prior to Visit  Medication Sig Dispense Refill  . albuterol (PROVENTIL HFA;VENTOLIN HFA) 108 (90 Base) MCG/ACT inhaler Inhale 2 puffs into the lungs every 6 (six) hours as needed for wheezing or shortness of breath.     Marland Kitchen aspirin 81 MG tablet Take 81 mg by mouth daily.    Marland Kitchen atorvastatin (LIPITOR) 10 MG tablet Take 10 mg daily by mouth.    . diclofenac (VOLTAREN) 50 MG EC tablet Take 1 tablet (50 mg total) by mouth 2 (two) times daily. 15 tablet 0  . fluconazole (DIFLUCAN) 200 MG tablet Take 200 mg by mouth every other day.  6  . fluticasone (FLONASE) 50 MCG/ACT nasal spray INSTILL 2 PUFFS INTO EACH NOSTRIL EVERY NIGHT AS NEEDED FOR ALLERGIES  3  . glipiZIDE (GLUCOTROL XL) 5 MG 24 hr tablet Take 1 tablet (5 mg total) by mouth daily with breakfast. 90 tablet 1  . losartan (COZAAR) 100 MG tablet Take 100 mg daily by mouth.    . methocarbamol (ROBAXIN) 500 MG tablet Take 1 tablet (500 mg total) by mouth 3 (three) times daily. 30 tablet 0  . nitroGLYCERIN (NITROSTAT) 0.4 MG SL tablet Place 0.4 mg under  the tongue every 5 (five) minutes as needed for chest pain.    Marland Kitchen ondansetron (ZOFRAN ODT) 4 MG disintegrating tablet Take 1 tablet (4 mg total) by mouth every 8 (eight) hours as needed for nausea or vomiting. 20 tablet 0  . sitaGLIPtin (JANUVIA) 100 MG tablet Take 1 tablet (100 mg total) by mouth daily. 30 tablet 3  . UNABLE TO FIND Apply 1 application topically 4 (four) times daily as needed. analgesic pain cream    . diazepam (VALIUM) 5 MG tablet Take 1 tablet (5 mg total) by mouth every 12 (twelve) hours as needed for anxiety. (Patient not taking: Reported on 12/07/2016) 10 tablet 0   No facility-administered medications prior to visit.    Past Medical History:  Diagnosis Date  . Back pain   . Carpal tunnel syndrome    bilateral  . Complication of anesthesia    "hard to wake up"  . Diabetes mellitus without complication ([place])   . Dyspnea    with excersion  . Family history of adverse reaction to anesthesia    mom voniting  . Obesity   . PONV (postoperative nausea and vomiting)   . Tricuspid valve regurgitation    also mitral regurg- trace on both per echo   Past Surgical History:  Procedure Laterality Date  . CESAREAN SECTION    . DILATION AND CURETTAGE OF UTERUS  x4   Social History   Socioeconomic History  . Marital status: Married    Spouse name: Not on file  . Number of children: Not on file  . Years of education: Not on file  . Highest education level: Not on file  Social Needs  . Financial resource strain: Not on file  . Food insecurity - worry: Not on file  . Food insecurity - inability: Not on file  . Transportation needs - medical: Not on file  . Transportation needs - non-medical: Not on file  Occupational History  . Not on file  Tobacco Use  . Smoking status: Never Smoker  . Smokeless tobacco: Never Used  Substance and Sexual Activity  . Alcohol use: No  . Drug use: No  . Sexual activity: Yes    Birth control/protection: None  Other Topics Concern   . Not on file  Social History Narrative  . Not on file   Family History  Problem Relation Age of Onset  . Cancer Father   . Diabetes Father   . Heart disease Father   . Hypertension Father   . Cancer Maternal Grandmother   . Diabetes Maternal Grandfather   . Cancer Paternal Grandmother   . Diabetes Paternal Grandfather       Review of Systems: Pertinent positive and negative review of systems were noted in the above HPI section. All other review of systems were otherwise negative.  Current Medications, Allergies, Past Medical History, Past Surgical History, Family History and Social History were reviewed in [employer] record.  Physical Exam: General: Well developed, well nourished, no acute distress Head: Normocephalic and atraumatic Eyes:  sclerae anicteric, EOMI Ears: Normal auditory acuity Mouth: No deformity or lesions Neck: Supple, no masses or thyromegaly Lungs: Clear throughout to auscultation Heart: Regular rate and rhythm; no murmurs, rubs or bruits Abdomen: Soft, non tender and non distended. No masses, hepatosplenomegaly or hernias noted. Normal Bowel sounds Rectal: decrease sphincter tone with very little squeeze, large volume of soft, bronw heme neg stool in the vault Musculoskeletal: Symmetrical with no gross deformities  Skin: No lesions on visible extremities Pulses:  Normal pulses noted Extremities: No clubbing, cyanosis, edema or deformities noted Neurological: Alert oriented x 4, grossly nonfocal Cervical Nodes:  No significant cervical adenopathy Inguinal Nodes: No significant inguinal adenopathy Psychological:  Alert and cooperative. Normal mood and affect  Assessment and Recommendations:  1.  Fecal incontinence related to S1 injury.  Constipation.  Increase flatus. MOM or MiraLAX every other day for constipation and bowel purge.  Neurology referral to further evaluate S1 injury.  Low gas diet.  Gas-X 3 times daily as  needed.  2.  Small volume hematochezia.  Suspected hemorrhoids.  Begin Preparation H supp daily as needed.  If symptoms persist use Preparation H supp coated with 1% hydrocortisone cream daily and schedule colonoscopy.  cc: Melina Schools, MD

## 2017-03-20 NOTE — Patient Instructions (Addendum)
Please use milk of magnesia or Miralax daily for constipation.   You can take over the counter Gas-X four times a day as needed for gas and bloating.   You have been given a gas prevention diet.   You will be contacted by [facility] for your appointment.   Normal BMI (Body Mass Index- based on height and weight) is between 19 and 25. Your BMI today is Body mass index is 40.48 kg/m. Marland Kitchen Please consider follow up  regarding your BMI with your Primary Care Provider.  Thank you for choosing me and [facility].  Pricilla Riffle. Dagoberto Ligas., MD., Marval Regal

## 2017-04-11 NOTE — Progress Notes (Signed)
Did this ever get resolved?

## 2017-04-28 DIAGNOSIS — G8929 Other chronic pain: Secondary | ICD-10-CM | POA: Diagnosis not present

## 2017-04-28 DIAGNOSIS — M5441 Lumbago with sciatica, right side: Secondary | ICD-10-CM | POA: Diagnosis not present

## 2017-05-15 ENCOUNTER — Ambulatory Visit: Payer: Federal, State, Local not specified - PPO | Admitting: Neurology

## 2017-05-16 ENCOUNTER — Ambulatory Visit: Payer: Federal, State, Local not specified - PPO | Admitting: Gastroenterology

## 2017-05-18 ENCOUNTER — Ambulatory Visit (INDEPENDENT_AMBULATORY_CARE_PROVIDER_SITE_OTHER): Payer: Federal, State, Local not specified - PPO | Admitting: Neurology

## 2017-05-18 ENCOUNTER — Encounter: Payer: Self-pay | Admitting: Neurology

## 2017-05-18 VITALS — BP 122/80 | HR 80 | Ht 68.0 in | Wt 226.4 lb

## 2017-05-18 DIAGNOSIS — R159 Full incontinence of feces: Secondary | ICD-10-CM

## 2017-05-18 DIAGNOSIS — M5417 Radiculopathy, lumbosacral region: Secondary | ICD-10-CM | POA: Diagnosis not present

## 2017-05-18 DIAGNOSIS — N319 Neuromuscular dysfunction of bladder, unspecified: Secondary | ICD-10-CM

## 2017-05-18 NOTE — Progress Notes (Addendum)
NEUROLOGY CONSULTATION NOTE  [name] MRN: [id number] DOB: July 18, 1980  Referring provider: Dr. Fuller Plan Primary care provider: Dr. Moreen Fowler  Reason for consult:  Fecal incontinence, S1 nerve root injury  HISTORY OF PRESENT ILLNESS: [name] is a 37 year old female with type 2 diabetes who presents for evaluation of fecal incontinence.   History supplemented by ED, surgical and GI notes.  Lumbar radiographs personally reviewed.  Lumbar MRI report reviewed but imaging not available.  In April 2018, she fractured toes in her right foot following a fall at work.  She said that her right knee may have buckled.  She had severe pain in the foot was was treated with pain medication for a while.  After she finished the main medication in May, she developed severe low back pain with radiating burning pain down the right leg and foot in late May 2018.  She presented to the ED on 5/30 for evaluation where lumbar spine radiograph was unremarkable.  Back and radicular pain persisted.  In July, she developed numbness in the perineal region down the leg and into the foot.  At this time, she lost bowel and bladder function.  She lost both bowel and bladder urgency and sensation of voiding.  She exhibited both bowel and bladder incontinence.  MRI of lumbar spine on 12/05/16 revealed large left central-right subarticular L5-S1 disc extrusion, causing severe central stenosis and impingement of the right S1 nerve root.  The conus was normal.  She underwent L5-S1 microdiscectomy and decompression on 12/14/16.  Since surgery, her pain has resolved.  Although modestly improved, she continues to have bowel and bladder dysfunction and perineal and radicular sensory loss down the right leg.  She denies abdominal pain, but notes pelvic pain.  She notes some nausea.  She has known bulging cervical disc, so she has chronic mild neck pain but nothing new or progressed.  She has bilateral carpal tunnel syndrome, but no knew  numbness in the hands.  She denies radicular pain or weakness in the upper extremities.  She has since had another fall in which it felt like her knee gave out.  She has been evaluated by gastroenterology and has an upcoming appointment with urology.  Hgb A1c from August was 9.5  PAST MEDICAL HISTORY: Past Medical History:  Diagnosis Date  . Back pain   . Carpal tunnel syndrome    bilateral  . Complication of anesthesia    "hard to wake up"  . Diabetes mellitus without complication ([place])   . Dyspnea    with excersion  . Family history of adverse reaction to anesthesia    mom voniting  . Obesity   . PONV (postoperative nausea and vomiting)   . Tricuspid valve regurgitation    also mitral regurg- trace on both per echo    PAST SURGICAL HISTORY: Past Surgical History:  Procedure Laterality Date  . CARDIAC CATHETERIZATION N/A 11/24/2015   Procedure: Left Heart Cath and Coronary Angiography;  Surgeon: Adrian Prows, MD;  Location: [place] [facility];  Service: Cardiovascular;  Laterality: N/A;  . CESAREAN SECTION    . DILATION AND CURETTAGE OF UTERUS     x4  . LUMBAR LAMINECTOMY/DECOMPRESSION MICRODISCECTOMY N/A 12/14/2016   Procedure: LUMBAR DECOMPRESSION MICRODISCECTOMY L5-S1;  Surgeon: Melina Schools, MD;  Location: [place];  Service: Orthopedics;  Laterality: N/A;  120 mins    MEDICATIONS: Current Outpatient Medications on File Prior to Visit  Medication Sig Dispense Refill  . cyclobenzaprine (FLEXERIL) 10  MG tablet Take 1 tablet by mouth at bedtime.    . diphenhydramine-acetaminophen (TYLENOL PM) 25-500 MG TABS tablet Take 1 tablet by mouth at bedtime as needed.    Marland Kitchen albuterol (PROVENTIL HFA;VENTOLIN HFA) 108 (90 Base) MCG/ACT inhaler Inhale 2 puffs into the lungs every 6 (six) hours as needed for wheezing or shortness of breath.     Marland Kitchen aspirin 81 MG tablet Take 81 mg by mouth daily.    Marland Kitchen atorvastatin (LIPITOR) 10 MG tablet Take 10 mg daily by mouth.    . diclofenac (VOLTAREN) 50 MG  EC tablet Take 1 tablet (50 mg total) by mouth 2 (two) times daily. (Patient not taking: Reported on 05/18/2017) 15 tablet 0  . fluconazole (DIFLUCAN) 200 MG tablet Take 200 mg by mouth every other day.  6  . fluticasone (FLONASE) 50 MCG/ACT nasal spray INSTILL 2 PUFFS INTO EACH NOSTRIL EVERY NIGHT AS NEEDED FOR ALLERGIES  3  . glipiZIDE (GLUCOTROL XL) 5 MG 24 hr tablet Take 1 tablet (5 mg total) by mouth daily with breakfast. 90 tablet 1  . losartan (COZAAR) 100 MG tablet Take 100 mg daily by mouth.    . methocarbamol (ROBAXIN) 500 MG tablet Take 1 tablet (500 mg total) by mouth 3 (three) times daily. 30 tablet 0  . nitroGLYCERIN (NITROSTAT) 0.4 MG SL tablet Place 0.4 mg under the tongue every 5 (five) minutes as needed for chest pain.     No current facility-administered medications on file prior to visit.     ALLERGIES: Allergies  Allergen Reactions  . Latex Rash    FAMILY HISTORY: Family History  Problem Relation Age of Onset  . Cancer Father   . Diabetes Father   . Heart disease Father   . Hypertension Father   . Cancer Maternal Grandmother   . Diabetes Maternal Grandfather   . Cancer Paternal Grandmother   . Diabetes Paternal Grandfather     SOCIAL HISTORY: Social History   Socioeconomic History  . Marital status: Married    Spouse name: Not on file  . Number of children: Not on file  . Years of education: Not on file  . Highest education level: Not on file  Social Needs  . Financial resource strain: Not on file  . Food insecurity - worry: Not on file  . Food insecurity - inability: Not on file  . Transportation needs - medical: Not on file  . Transportation needs - non-medical: Not on file  Occupational History  . Not on file  Tobacco Use  . Smoking status: Never Smoker  . Smokeless tobacco: Never Used  Substance and Sexual Activity  . Alcohol use: No  . Drug use: No  . Sexual activity: Yes    Birth control/protection: None  Other Topics Concern  . Not on  file  Social History Narrative  . Not on file    REVIEW OF SYSTEMS: Constitutional: No fevers, chills, or sweats, no generalized fatigue, change in appetite Eyes: No visual changes, double vision, eye pain Ear, nose and throat: No hearing loss, ear pain, nasal congestion, sore throat Cardiovascular: No chest pain, palpitations Respiratory:  No shortness of breath at rest or with exertion, wheezes GastrointestinaI: No nausea, vomiting, diarrhea, abdominal pain, fecal incontinence Genitourinary:  No dysuria, urinary retention or frequency Musculoskeletal:  No neck pain, back pain Integumentary: No rash, pruritus, skin lesions Neurological: as above Psychiatric: No depression, insomnia, anxiety Endocrine: No palpitations, fatigue, diaphoresis, mood swings, change in appetite, change in weight, increased  thirst Hematologic/Lymphatic:  No purpura, petechiae. Allergic/Immunologic: no itchy/runny eyes, nasal congestion, recent allergic reactions, rashes  PHYSICAL EXAM: My CMA, Jeanette Caprice, was present during the physical exam and appointment. Vitals:   05/18/17 1310  BP: 122/80  Pulse: 80  SpO2: 98%   General: No acute distress.  Patient appears well-groomed.  Head:  Normocephalic/atraumatic Eyes:  fundi examined but not visualized Neck: supple, no paraspinal tenderness, full range of motion Back: No paraspinal tenderness Heart: regular rate and rhythm Lungs: Clear to auscultation bilaterally. Vascular: No carotid bruits. Rectal:  Decreased sphincter tone Neurological Exam: Mental status: alert and oriented to person, place, and time, recent and remote memory intact, fund of knowledge intact, attention and concentration intact, speech fluent and not dysarthric, language intact. Cranial nerves: CN I: not tested CN II: pupils equal, round and reactive to light, visual fields intact CN III, IV, VI:  full range of motion, no nystagmus, no ptosis CN V: facial sensation intact CN  VII: upper and lower face symmetric CN VIII: hearing intact CN IX, X: gag intact, uvula midline CN XI: sternocleidomastoid and trapezius muscles intact CN XII: tongue midline Bulk & Tone: normal, no fasciculations. Motor:  5/5 throughout but she did demonstrate difficulty standing and walking on the toes of her right foot. Sensation:  Pinprick sensation reduced in the right perineal region and dorsum of right foot as well as posterior and lateral right leg; vibration sensation intact. Deep Tendon Reflexes:  1+ and symmetric in upper extremities and absent in lower extremities, toes downgoing.  Finger to nose testing:  Without dysmetria.  Gait:  Normal station and stride.  Able to turn and tandem walk. Difficulty standing and walking on toes of right foot.  Able to walk on heels.  Romberg negative.  IMPRESSION: 1.  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 2.  Fecal incontinence/neurogenic bladder.  I am not certain that the S1 nerve root injury is the cause of her dysfunction.  Typically the S2-S4 nerve roots supply this functioning.  I would re-evaluate the region of the sacral plexus to look for any new possible etiology.  Ultimately, if imaging is unrevealing, I would suspect that her symptoms are related to her injury as I would have no other explanation.  She does not exhibit any signs to suggest a CNS lesion involving the brain and spinal cord.  PLAN: 1.  We will first repeat MRI of lumbar spine with and without contrast. 2.  If lumbar MRI is unrevealing, then I would pursue MRI of pelvis with contrast. 3.  Further recommendations and follow up pending results.  Thank you for allowing me to take part in the care of this patient.  Metta Clines, DO  CC:  Antony Contras, MD  Lucio Edward, MD

## 2017-05-18 NOTE — Patient Instructions (Addendum)
I think the problem is still related to the lower back/pelvic region.  However the S1 nerve alone does not typically cause the bowel and bladder function.  It does explain the leg pain/numbness/weakness however.  Your exam does not reveal anything to make me believe that the brain and spinal cord is the cause of your symptoms.  1.  We will repeat MRI of lumbar spine with and without contrast to look for any cause of your bowel and bladder dysfunction. 2.  If lumbar MRI is not revealing, then I would perform MRI of pelvis 3.  Further recommendations and follow up pending results of tests.

## 2017-05-24 DIAGNOSIS — E118 Type 2 diabetes mellitus with unspecified complications: Secondary | ICD-10-CM | POA: Diagnosis not present

## 2017-05-25 DIAGNOSIS — K080 Exfoliation of teeth due to systemic causes: Secondary | ICD-10-CM | POA: Diagnosis not present

## 2017-05-28 ENCOUNTER — Other Ambulatory Visit: Payer: Federal, State, Local not specified - PPO

## 2017-06-12 ENCOUNTER — Other Ambulatory Visit: Payer: Federal, State, Local not specified - PPO

## 2017-06-12 DIAGNOSIS — M5116 Intervertebral disc disorders with radiculopathy, lumbar region: Secondary | ICD-10-CM | POA: Diagnosis not present

## 2017-06-12 DIAGNOSIS — M5416 Radiculopathy, lumbar region: Secondary | ICD-10-CM | POA: Diagnosis not present

## 2017-06-12 DIAGNOSIS — M4726 Other spondylosis with radiculopathy, lumbar region: Secondary | ICD-10-CM | POA: Diagnosis not present

## 2017-06-12 DIAGNOSIS — M5137 Other intervertebral disc degeneration, lumbosacral region: Secondary | ICD-10-CM | POA: Diagnosis not present

## 2017-06-19 DIAGNOSIS — M5116 Intervertebral disc disorders with radiculopathy, lumbar region: Secondary | ICD-10-CM | POA: Diagnosis not present

## 2017-06-26 DIAGNOSIS — M545 Low back pain: Secondary | ICD-10-CM | POA: Diagnosis not present

## 2017-07-03 DIAGNOSIS — Z9889 Other specified postprocedural states: Secondary | ICD-10-CM | POA: Diagnosis not present

## 2017-07-11 DIAGNOSIS — E1165 Type 2 diabetes mellitus with hyperglycemia: Secondary | ICD-10-CM | POA: Diagnosis not present

## 2017-07-20 ENCOUNTER — Telehealth: Payer: Self-pay | Admitting: Neurology

## 2017-07-20 NOTE — Telephone Encounter (Signed)
Patient needs a MRI  the pelvis. She would like [place] to go ahead and set that up

## 2017-07-20 NOTE — Telephone Encounter (Signed)
MRI lumbar spine was supposed to be done first.  It looks like it was cancelled x 2.  Please advise.

## 2017-07-21 ENCOUNTER — Telehealth: Payer: Self-pay | Admitting: Neurology

## 2017-07-21 ENCOUNTER — Other Ambulatory Visit: Payer: Self-pay | Admitting: *Deleted

## 2017-07-21 DIAGNOSIS — M5417 Radiculopathy, lumbosacral region: Secondary | ICD-10-CM

## 2017-07-21 NOTE — Telephone Encounter (Signed)
Patient returned your call. She said she had the Lumbar Spine with but if she needs to have it again without Contrast she can. She also said she is needing the Pelvis to be scheduled. Please Call. Thanks

## 2017-07-21 NOTE — Telephone Encounter (Signed)
Patient had MRI lumbar spine on 06-26-17.  She is going to have [employer] fax [place] a copy of the report.  Let me know if you want to order pelvic MRI after you see the report.

## 2017-07-21 NOTE — Telephone Encounter (Signed)
Attempted to contact patient but no answer and voicemail is not set up.  Order placed for MRI lumbar spine.

## 2017-07-21 NOTE — Telephone Encounter (Signed)
I would like to repeat MRI of lumbar spine with and without contrast first, as planned.

## 2017-07-25 ENCOUNTER — Other Ambulatory Visit: Payer: Self-pay | Admitting: *Deleted

## 2017-07-25 DIAGNOSIS — R159 Full incontinence of feces: Secondary | ICD-10-CM

## 2017-07-25 NOTE — Telephone Encounter (Signed)
Patient notified that referral has been sent for pelvic MRI at [place].

## 2017-07-25 NOTE — Telephone Encounter (Signed)
I received the MRI of lumbar spine w/wo report from 06/26/17:  It reveals:    "1.  Postsurgical changes at L5-S1 without residual disc herniation.  Enhancing granulation tissue encasing descending right S1 nerve roots with mild mass effect and displacement of descending right S1 nerve roots without central stenosis.  2.  Mild L3-4 spinal canal stenosis with contact of descending right L4 nerve roots without displacement, likely not significantly changed.  3.  Mild L4-5 spinal canal stenosis, not significantly changed."    This does not explain her fecal incontinence.  Therefore, I would like to proceed with MRI of pelvis with contrast to evaluate for cause of fecal incontinence.

## 2017-07-28 ENCOUNTER — Other Ambulatory Visit: Payer: Self-pay | Admitting: Family Medicine

## 2017-07-28 NOTE — Telephone Encounter (Signed)
Patient called about the interface prescription refill request of Glipizide and to schedule an established care appointment with a provider at [facility]. Patient says "[facility] called and told me that I was eligible for a 3 month supply of Glipizide. I asked how much will it cost and they would not give me that information. So I guess they sent it to you to approve the refills." I asked why was it sent to [facility], she says "I saw a doctor there prior to my back surgery because my diabetes was out of control and he prescribed the medicine." I advised he was no longer her and that she can establish care with another provider. She says "you can just go ahead an deny it and I will take care of everything by another means."

## 2017-07-29 DIAGNOSIS — M5416 Radiculopathy, lumbar region: Secondary | ICD-10-CM | POA: Diagnosis not present

## 2017-07-29 DIAGNOSIS — M5136 Other intervertebral disc degeneration, lumbar region: Secondary | ICD-10-CM | POA: Diagnosis not present

## 2017-07-29 DIAGNOSIS — M961 Postlaminectomy syndrome, not elsewhere classified: Secondary | ICD-10-CM | POA: Diagnosis not present

## 2017-08-07 ENCOUNTER — Telehealth: Payer: Self-pay | Admitting: Neurology

## 2017-08-07 NOTE — Telephone Encounter (Signed)
Patient lmom that her secondary insurance has denied her having ann MRI. Please Call. Thanks

## 2017-08-08 DIAGNOSIS — M5416 Radiculopathy, lumbar region: Secondary | ICD-10-CM | POA: Diagnosis not present

## 2017-08-09 NOTE — Telephone Encounter (Signed)
Per message from [place]: The documents that I uploaded in evicore were not accepted so I faxed in office notes on Thursday March 28.  I will give them a call and I'll let you know what the insurance decide.  I called Pt, advsd her that we are waiting on determination from [place] who handles PA's for her 2ndry insurance. Pt was upset that we had wrong insurance information, advsd her [name] was [employer]. Pt wanted to know why she did not receive a call back yesterday, I apologized, advsd I was out of the office and we had not rcvd a determination, but I will contact her as soon as we hear something. Pt was upset and said she was ending the call for now, and we should call her when we have a determination.

## 2017-08-09 NOTE — Telephone Encounter (Signed)
Sending message to [facility]

## 2017-08-09 NOTE — Telephone Encounter (Signed)
Called Pt back to confirm 2ndry information. She said it is [place]. Advsd her I will contact our PA department to ensure they have that info. Pt said she rcvd denial from [place], so we must have the information.  Sent message to [place], as I only see [place] as primary and [employer] as 2ndry.

## 2017-08-09 NOTE — Telephone Encounter (Signed)
Patient has called back and lmom needing to have you contact her secondary Insurance company regarding her Pelvic MRI being denied. Thanks

## 2017-08-10 ENCOUNTER — Telehealth: Payer: Self-pay

## 2017-08-10 ENCOUNTER — Other Ambulatory Visit: Payer: Self-pay

## 2017-08-10 DIAGNOSIS — R159 Full incontinence of feces: Secondary | ICD-10-CM

## 2017-08-10 NOTE — Telephone Encounter (Signed)
Pt called back, I asked Brandy to relay the message that the MRI has been approved and [facility] will contact her to schedule.

## 2017-08-10 NOTE — Telephone Encounter (Addendum)
[employer] denied MRI of pelvis, required a peer to peer. Dr Tomi Likens spoke with [facility]. Has been approved for CPT: 72197 with and without contrast with fat suppression imaging, PA# [id number]  Tried to reach Pt to advise her, no answer and her VM has not been set up.

## 2017-08-16 DIAGNOSIS — N393 Stress incontinence (female) (male): Secondary | ICD-10-CM | POA: Diagnosis not present

## 2017-08-16 DIAGNOSIS — N130 Hydronephrosis with ureteropelvic junction obstruction: Secondary | ICD-10-CM | POA: Diagnosis not present

## 2017-08-16 DIAGNOSIS — R8271 Bacteriuria: Secondary | ICD-10-CM | POA: Diagnosis not present

## 2017-08-16 DIAGNOSIS — R351 Nocturia: Secondary | ICD-10-CM | POA: Diagnosis not present

## 2017-08-16 NOTE — Progress Notes (Signed)
Pt called questioning the name of the imaging study they mentioned when [place] called to schedule today. Advsd Pt the initial test was denied and Dr Tomi Likens had to do a peer to peer with her insurance company, which gave him the CPT code of the study that they would approve, Is basically the same imaging study he wanted. Pt to call and schedule.

## 2017-08-22 ENCOUNTER — Ambulatory Visit
Admission: RE | Admit: 2017-08-22 | Discharge: 2017-08-22 | Disposition: A | Discharge status: Home / Self Care | Payer: Federal, State, Local not specified - PPO | Source: Ambulatory Visit | Attending: Neurology | Admitting: Neurology

## 2017-08-22 DIAGNOSIS — E1165 Type 2 diabetes mellitus with hyperglycemia: Secondary | ICD-10-CM | POA: Diagnosis not present

## 2017-08-22 DIAGNOSIS — S3992XA Unspecified injury of lower back, initial encounter: Secondary | ICD-10-CM | POA: Diagnosis not present

## 2017-08-22 DIAGNOSIS — Z794 Long term (current) use of insulin: Secondary | ICD-10-CM | POA: Diagnosis not present

## 2017-08-22 DIAGNOSIS — R159 Full incontinence of feces: Secondary | ICD-10-CM

## 2017-08-22 MED ORDER — GADOBENATE DIMEGLUMINE 529 MG/ML IV SOLN
20.0000 mL | Freq: Once | INTRAVENOUS | Status: AC | PRN
  Administered 2017-08-22: 20 mL via INTRAVENOUS

## 2017-08-22 NOTE — Progress Notes (Signed)
Rcvd call from [place] at [facility]. Dr Posey Pronto, radiologist, was questioning the study ordered. He felt like this was not going to show what Dr Tomi Likens wanted for the Dx of fecal incontinence. Advised her Dr Tomi Likens originally ordered MRI pelvis, but was denied by her insurance, Dr Tomi Likens actually participated in a peer to peer to have the study approved, the Sacrum SI Joint is what they would allow. She suggested I call Dr Posey Pronto [phone], I called [facility]. Did not receive call back, called Remo Lipps back (ext [phone]). She stated Dr Posey Pronto had discussed with her what would give Dr Tomi Likens the information he wanted, that would be within the scope of the test the Pt's insurance would allow. I thanked them for taking such care of our Pt.

## 2017-08-23 ENCOUNTER — Telehealth: Payer: Self-pay

## 2017-08-23 DIAGNOSIS — N130 Hydronephrosis with ureteropelvic junction obstruction: Secondary | ICD-10-CM | POA: Diagnosis not present

## 2017-08-23 NOTE — Telephone Encounter (Signed)
-----   Message from Pieter Partridge, DO sent at 08/23/2017  8:52 AM EDT ----- MRI of pelvis does not reveal any cause for fecal incontinence.  I have no other explanation and recommend following up with gastroenterology.

## 2017-08-23 NOTE — Telephone Encounter (Signed)
Called and spoke with Pt, advsd her of results. She will contact GI to f/u.

## 2017-08-29 ENCOUNTER — Ambulatory Visit: Payer: Federal, State, Local not specified - PPO | Admitting: Gastroenterology

## 2017-08-31 ENCOUNTER — Encounter: Payer: Self-pay | Admitting: Internal Medicine

## 2017-08-31 ENCOUNTER — Ambulatory Visit (INDEPENDENT_AMBULATORY_CARE_PROVIDER_SITE_OTHER): Payer: Federal, State, Local not specified - PPO | Admitting: Internal Medicine

## 2017-08-31 VITALS — BP 118/80 | HR 110 | Ht 68.0 in | Wt 253.2 lb

## 2017-08-31 DIAGNOSIS — M47816 Spondylosis without myelopathy or radiculopathy, lumbar region: Secondary | ICD-10-CM | POA: Diagnosis not present

## 2017-08-31 DIAGNOSIS — R0602 Shortness of breath: Secondary | ICD-10-CM | POA: Diagnosis not present

## 2017-08-31 DIAGNOSIS — R0600 Dyspnea, unspecified: Secondary | ICD-10-CM

## 2017-08-31 DIAGNOSIS — R0609 Other forms of dyspnea: Secondary | ICD-10-CM

## 2017-08-31 DIAGNOSIS — Z9889 Other specified postprocedural states: Secondary | ICD-10-CM | POA: Diagnosis not present

## 2017-08-31 DIAGNOSIS — M5416 Radiculopathy, lumbar region: Secondary | ICD-10-CM | POA: Diagnosis not present

## 2017-08-31 DIAGNOSIS — M961 Postlaminectomy syndrome, not elsewhere classified: Secondary | ICD-10-CM | POA: Diagnosis not present

## 2017-08-31 LAB — NITRIC OXIDE

## 2017-08-31 NOTE — Patient Instructions (Addendum)
ICD-10-CM   1. Dyspnea on exertion R06.09     Do PFT Do HRCT -- supine and prone Sign release to get records from DR [facility]  followup Return to see me or APP after above

## 2017-08-31 NOTE — Progress Notes (Signed)
Subjective:    Patient ID: [name], female    DOB: [birth year], 37 y.o.   MRN: [id number] PCP Antony Contras, MD  HPI  IOV 08/31/2017  Chief Complaint  Patient presents with  . Consult    Self referral for management of COPD.  Pt states she was first diagnosed with COPD in 2016.  Pt has complaints of SOB and CP.    [name] 37 y.o. female [address] [place] [address] obese female who is self-referred through a friend of hers who is my patient.  She presents with?  Significant other [name] and her daughter [name].  She tells me for the last 4 years or so she has had insidious onset of shortness of breath that is progressive.  Worsened with exertion relieved by rest.  Currently exertional dyspnea for a flight of stairs and relieved by rest.  There is some associated wheezing and chest tightness.  She only gets episodes of cough 2 times a year when she gets sick but then these cough episodes can last several weeks.  There is no orthopnea proximal nocturnal dyspnea or hemoptysis or fever or chills.  She remembers in 2016 she had pulmonary function testing and apparently somebody told her that she had COPD but when I personally visualized the trace this looked more like restriction with normal diffusion capacity consistent with obesity.  In 2007 she had CT angiogram chest.  I personally visualized those lung fields and they look clear to me.  Nevertheless she has progressive dyspnea.  Of note, in August 2018 she had a fall at work and she had lumbar disc herniation with resultant loss of bladder and bowel function and is status post surgery with lumbar decompression microdiscectomy and she has slowly improved.  She says the last few years of her life have been very stressful with marital separation and these issues.  Exam nitric oxide today is 6 ppb and normal.  Of note she underwent cardiology work-up?  With Dr. Einar Gip and apparently she has a leaky heart valve or some issue.  She  has been placed on nitrates after a cardiac stress test.  I suspect the primary issue is likely to be obesity related diastolic dysfunction   CAT COPD Symptom & Quality of Life Score ([place]) 0 is no burden. 5 is highest burden 08/31/2017   Never Cough -> Cough all the time 3  No phlegm in chest -> Chest is full of phlegm 1  No chest tightness -> Chest feels very tight 3  No dyspnea for 1 flight stairs/hill -> Very dyspneic for 1 flight of stairs 4  No limitations for ADL at home -> Very limited with ADL at home 2  Confident leaving home -> Not at all confident leaving home 2  Sleep soundly -> Do not sleep soundly because of lung condition 3  Lots of Energy -> No energy at all 5  TOTAL Score (max 40)  21       Walking desaturation test on 08/31/2017 185 feet x 3 laps on ROOM AIR:  did not desaturate. Rest pulse ox was 99%, final pulse ox was 99%. HR response 99/min at rest to 106/min at peak exertion. Patient [name]  Did not Desaturate < 88% . Kambria F Mireles did not  Desaturated </= 3% points. [name] yes did get tachyardic     has a past medical history of Back pain, Carpal tunnel syndrome, Complication of anesthesia,  Diabetes mellitus without complication ([facility]), Dyspnea, Family history of adverse reaction to anesthesia, Obesity, PONV (postoperative nausea and vomiting), and Tricuspid valve regurgitation.   reports that she has never smoked. She has never used smokeless tobacco.  Past Surgical History:  Procedure Laterality Date  . CARDIAC CATHETERIZATION N/A 11/24/2015   Procedure: Left Heart Cath and Coronary Angiography;  Surgeon: Adrian Prows, MD;  Location: [facility];  Service: Cardiovascular;  Laterality: N/A;  . CESAREAN SECTION    . DILATION AND CURETTAGE OF UTERUS     x4  . LUMBAR LAMINECTOMY/DECOMPRESSION MICRODISCECTOMY N/A 12/14/2016   Procedure: LUMBAR DECOMPRESSION MICRODISCECTOMY L5-S1;  Surgeon: Melina Schools, MD;  Location: [facility];  Service:  Orthopedics;  Laterality: N/A;  120 mins    Allergies  Allergen Reactions  . Latex Rash    Immunization History  Administered Date(s) Administered  . Influenza Split 02/03/2014  . Pneumococcal Polysaccharide-23 11/27/2012    Family History  Problem Relation Age of Onset  . Cancer Father   . Diabetes Father   . Heart disease Father   . Hypertension Father   . Cancer Maternal Grandmother   . Diabetes Maternal Grandfather   . Cancer Paternal Grandmother   . Diabetes Paternal Grandfather      Current Outpatient Medications:  .  albuterol (PROVENTIL HFA;VENTOLIN HFA) 108 (90 Base) MCG/ACT inhaler, Inhale 2 puffs into the lungs every 6 (six) hours as needed for wheezing or shortness of breath. , Disp: , Rfl:  .  aspirin 81 MG tablet, Take 81 mg by mouth daily., Disp: , Rfl:  .  atorvastatin (LIPITOR) 10 MG tablet, Take 10 mg daily by mouth., Disp: , Rfl:  .  cyclobenzaprine (FLEXERIL) 10 MG tablet, Take 1 tablet by mouth at bedtime., Disp: , Rfl:  .  diclofenac (VOLTAREN) 50 MG EC tablet, Take 1 tablet (50 mg total) by mouth 2 (two) times daily., Disp: 15 tablet, Rfl: 0 .  diphenhydramine-acetaminophen (TYLENOL PM) 25-500 MG TABS tablet, Take 1 tablet by mouth at bedtime as needed., Disp: , Rfl:  .  fluconazole (DIFLUCAN) 200 MG tablet, Take 200 mg by mouth every other day., Disp: , Rfl: 6 .  fluticasone (FLONASE) 50 MCG/ACT nasal spray, INSTILL 2 PUFFS INTO EACH NOSTRIL EVERY NIGHT AS NEEDED FOR ALLERGIES, Disp: , Rfl: 3 .  gabapentin (NEURONTIN) 300 MG capsule, gabapentin 300 mg capsule, Disp: , Rfl:  .  Insulin Glargine (BASAGLAR KWIKPEN) 100 UNIT/ML SOPN, Basaglar KwikPen U-100 Insulin 100 unit/mL (3 mL) subcutaneous  20 UNITS (TITRATE AS DIRECTED) ONCE A DAY SUBCUTANEOUS 30 DAYS, Disp: , Rfl:  .  losartan (COZAAR) 100 MG tablet, Take 100 mg daily by mouth., Disp: , Rfl:  .  methocarbamol (ROBAXIN) 500 MG tablet, Take 1 tablet (500 mg total) by mouth 3 (three) times daily., Disp:  30 tablet, Rfl: 0 .  nitroGLYCERIN (NITROSTAT) 0.4 MG SL tablet, Place 0.4 mg under the tongue every 5 (five) minutes as needed for chest pain., Disp: , Rfl:     Review of Systems  Constitutional: Positive for unexpected weight change. Negative for fever.  HENT: Positive for ear pain and sinus pressure. Negative for congestion, dental problem, nosebleeds, postnasal drip, rhinorrhea, sneezing, sore throat and trouble swallowing.   Eyes: Positive for itching. Negative for redness.  Respiratory: Positive for chest tightness, shortness of breath and wheezing. Negative for cough.   Cardiovascular: Positive for palpitations. Negative for leg swelling.  Gastrointestinal: Positive for nausea. Negative for vomiting.  Genitourinary: Positive for  dysuria.  Musculoskeletal: Negative for joint swelling.  Skin: Negative for rash.  Allergic/Immunologic: Negative.  Negative for environmental allergies, food allergies and immunocompromised state.  Neurological: Positive for headaches.  Hematological: Bruises/bleeds easily.  Psychiatric/Behavioral: Negative for dysphoric mood. The patient is nervous/anxious.        Objective:   Physical Exam  Constitutional: She is oriented to person, place, and time. She appears well-developed and well-nourished. No distress.  HENT:  Head: Normocephalic and atraumatic.  Right Ear: External ear normal.  Left Ear: External ear normal.  Mouth/Throat: Oropharynx is clear and moist. No oropharyngeal exudate.  Eyes: Pupils are equal, round, and reactive to light. Conjunctivae and EOM are normal. Right eye exhibits no discharge. Left eye exhibits no discharge. No scleral icterus.  Neck: Normal range of motion. Neck supple. No JVD present. No tracheal deviation present. No thyromegaly present.  Cardiovascular: Normal rate, regular rhythm, normal heart sounds and intact distal pulses. Exam reveals no gallop and no friction rub.  No murmur heard. Pulmonary/Chest: Effort  normal and breath sounds normal. No respiratory distress. She has no wheezes. She has no rales. She exhibits no tenderness.  Abdominal: Soft. Bowel sounds are normal. She exhibits no distension and no mass. There is no tenderness. There is no rebound and no guarding.  Musculoskeletal: Normal range of motion. She exhibits no edema or tenderness.  Lymphadenopathy:    She has no cervical adenopathy.  Neurological: She is alert and oriented to person, place, and time. She has normal reflexes. No cranial nerve deficit. She exhibits normal muscle tone. Coordination normal.  Skin: Skin is warm and dry. No rash noted. She is not diaphoretic. No erythema. No pallor.  Psychiatric: She has a normal mood and affect. Her behavior is normal. Judgment and thought content normal.  Vitals reviewed.   Vitals:   08/31/17 1702  BP: 118/80  Pulse: (!) 110  SpO2: 96%  Weight: 253 lb 3.2 oz (114.9 kg)  Height: 5\' 8"  (1.727 m)    Estimated body mass index is 38.5 kg/m as calculated from the following:   Height as of this encounter: 5\' 8"  (1.727 m).   Weight as of this encounter: 253 lb 3.2 oz (114.9 kg).           Assessment & Plan:     ICD-10-CM   1. Dyspnea on exertion R06.09 Nitric oxide    Pulmonary function test  2. Shortness of breath R06.02 CT Chest High Resolution    ssuspect obesity/diast dysfunction And deconditioning is reason for dyspnea.  Exhaled  nitric oxide being normal argues against asthma.  She is too young and very limited smoking history [passive smoking of 17 years] to have COPD.  Plan HRCT PFT RElease notes from [place]  next few to several week but after above   Dr. Brand Males, M.D., [facility].C.P Pulmonary and Critical Care Medicine Staff Physician, [place] Director - Interstitial Lung Disease  Program  Pulmonary [place] [facility] [employer], [place], [address]  Pager: [phone], If no  answer or between  15:00h - 7:00h: call [phone] Telephone: [phone]

## 2017-09-01 ENCOUNTER — Other Ambulatory Visit: Payer: Self-pay | Admitting: *Deleted

## 2017-09-01 ENCOUNTER — Ambulatory Visit (INDEPENDENT_AMBULATORY_CARE_PROVIDER_SITE_OTHER)
Admission: RE | Admit: 2017-09-01 | Discharge: 2017-09-01 | Disposition: A | Discharge status: Home / Self Care | Payer: Federal, State, Local not specified - PPO | Source: Ambulatory Visit | Attending: Internal Medicine | Admitting: Internal Medicine

## 2017-09-01 DIAGNOSIS — R0609 Other forms of dyspnea: Secondary | ICD-10-CM | POA: Diagnosis not present

## 2017-09-01 DIAGNOSIS — R0600 Dyspnea, unspecified: Secondary | ICD-10-CM

## 2017-09-01 DIAGNOSIS — R05 Cough: Secondary | ICD-10-CM | POA: Diagnosis not present

## 2017-09-01 DIAGNOSIS — R0602 Shortness of breath: Secondary | ICD-10-CM | POA: Diagnosis not present

## 2017-09-07 ENCOUNTER — Ambulatory Visit (INDEPENDENT_AMBULATORY_CARE_PROVIDER_SITE_OTHER)
Admission: RE | Admit: 2017-09-07 | Discharge: 2017-09-07 | Disposition: A | Discharge status: Home / Self Care | Payer: Federal, State, Local not specified - PPO | Source: Ambulatory Visit | Attending: Internal Medicine | Admitting: Internal Medicine

## 2017-09-07 DIAGNOSIS — R0602 Shortness of breath: Secondary | ICD-10-CM

## 2017-09-13 ENCOUNTER — Telehealth: Payer: Self-pay | Admitting: Gastroenterology

## 2017-09-13 NOTE — Telephone Encounter (Signed)
Pt still upset that her appt was rescheduled when Dr. Fuller Plan has to do an ERCP, does not think it is fair that she cannot get in sooner than July to see Dr. Fuller Plan. Offered multiple times to schedule pt with an APP but she declined. States that she should take precedence over other individuals to be scheduled sooner since she was cancelled. Pt states she is going to call her insurance company to see if there is another GI provider that she can see sooner. Encouraged pt to call us back if she changed her mind.

## 2017-09-13 NOTE — Telephone Encounter (Signed)
Patient states she is still having gi symptoms and does not wish to reschedule to next open appt in July due to Dr.Stark cancelling and her, and pt does not think she should have to wait till then. Pt requesting to speak with nurse regarding symptoms and appt.

## 2017-09-13 NOTE — Telephone Encounter (Signed)
I'm sorry for the inconvenience. ERCP was urgent/emergent for a hospitalized patient and no one else was available to perform ERCP.  Please encourage her to take a sooner follow up with one our APPs or place her on my cancellation list and closely track my schedule for a cancellation. I will communicate closely with the APP if she takes that appt.

## 2017-09-14 NOTE — Telephone Encounter (Signed)
Encouraged pt to schedule an appointment with an APP but pt refused. Added her to cancellation list.

## 2017-09-19 DIAGNOSIS — N3944 Nocturnal enuresis: Secondary | ICD-10-CM | POA: Diagnosis not present

## 2017-09-19 DIAGNOSIS — N3942 Incontinence without sensory awareness: Secondary | ICD-10-CM | POA: Diagnosis not present

## 2017-09-19 DIAGNOSIS — N393 Stress incontinence (female) (male): Secondary | ICD-10-CM | POA: Diagnosis not present

## 2017-09-23 DIAGNOSIS — M47816 Spondylosis without myelopathy or radiculopathy, lumbar region: Secondary | ICD-10-CM | POA: Diagnosis not present

## 2017-09-28 ENCOUNTER — Encounter: Payer: Self-pay | Admitting: Family Medicine

## 2017-10-05 DIAGNOSIS — N3944 Nocturnal enuresis: Secondary | ICD-10-CM | POA: Diagnosis not present

## 2017-10-05 DIAGNOSIS — N3942 Incontinence without sensory awareness: Secondary | ICD-10-CM | POA: Diagnosis not present

## 2017-10-11 ENCOUNTER — Telehealth: Payer: Self-pay | Admitting: Gastroenterology

## 2017-10-11 NOTE — Telephone Encounter (Signed)
Patient will try Miralax 1-3 times a day for constipation.  She is scheduled for OV on 12/28/17

## 2017-10-31 ENCOUNTER — Ambulatory Visit (INDEPENDENT_AMBULATORY_CARE_PROVIDER_SITE_OTHER): Payer: Federal, State, Local not specified - PPO | Admitting: Internal Medicine

## 2017-10-31 ENCOUNTER — Other Ambulatory Visit: Payer: Self-pay | Admitting: Internal Medicine

## 2017-10-31 ENCOUNTER — Encounter: Payer: Self-pay | Admitting: Internal Medicine

## 2017-10-31 VITALS — BP 122/82 | HR 92 | Ht 68.0 in | Wt 258.0 lb

## 2017-10-31 DIAGNOSIS — R0609 Other forms of dyspnea: Secondary | ICD-10-CM

## 2017-10-31 DIAGNOSIS — R0789 Other chest pain: Secondary | ICD-10-CM

## 2017-10-31 DIAGNOSIS — Z794 Long term (current) use of insulin: Secondary | ICD-10-CM | POA: Diagnosis not present

## 2017-10-31 DIAGNOSIS — E1165 Type 2 diabetes mellitus with hyperglycemia: Secondary | ICD-10-CM | POA: Diagnosis not present

## 2017-10-31 DIAGNOSIS — IMO0002 Reserved for concepts with insufficient information to code with codable children: Secondary | ICD-10-CM

## 2017-10-31 DIAGNOSIS — R0600 Dyspnea, unspecified: Secondary | ICD-10-CM

## 2017-10-31 DIAGNOSIS — R229 Localized swelling, mass and lump, unspecified: Principal | ICD-10-CM

## 2017-10-31 LAB — PULMONARY FUNCTION TEST
DL/VA % pred: 125 %
DL/VA: 6.59 ml/min/mmHg/L
DLCO UNC: 28.4 ml/min/mmHg
DLCO unc % pred: 95 %
FEF 25-75 Post: 2.49 L/sec
FEF 25-75 Pre: 2.44 L/sec
FEF2575-%Change-Post: 2 %
FEF2575-%PRED-POST: 72 %
FEF2575-%Pred-Pre: 70 %
FEV1-%Change-Post: 0 %
FEV1-%PRED-POST: 72 %
FEV1-%PRED-PRE: 72 %
FEV1-POST: 2.51 L
FEV1-Pre: 2.49 L
FEV1FVC-%Change-Post: 0 %
FEV1FVC-%Pred-Pre: 98 %
FEV6-%CHANGE-POST: 0 %
FEV6-%PRED-POST: 73 %
FEV6-%PRED-PRE: 73 %
FEV6-POST: 3.05 L
FEV6-Pre: 3.05 L
FEV6FVC-%CHANGE-POST: 0 %
FEV6FVC-%PRED-POST: 101 %
FEV6FVC-%PRED-PRE: 102 %
FVC-%Change-Post: 0 %
FVC-%PRED-POST: 72 %
FVC-%Pred-Pre: 72 %
FVC-Post: 3.06 L
FVC-Pre: 3.05 L
POST FEV1/FVC RATIO: 82 %
POST FEV6/FVC RATIO: 100 %
PRE FEV6/FVC RATIO: 100 %
Pre FEV1/FVC ratio: 82 %
RV % PRED: 82 %
RV: 1.43 L
TLC % PRED: 80 %
TLC: 4.53 L

## 2017-10-31 NOTE — Progress Notes (Signed)
Subjective:     Patient ID: [name], Deleon   DOB: 1980-10-05, 37 y.o.   MRN: [id number] PCP Paula Contras, MD  HPI   PCP Paula Contras, MD  HPI  [name] 08/31/2017  Chief Complaint  Patient presents with  . Consult    Self referral for management of COPD.  Pt states she was first diagnosed with COPD in 2016.  Pt has complaints of SOB and CP.    [name] who is self-referred through a friend of hers who is my patient.  She presents with?  Significant other [name] and her daughter [name].  She tells me for the last 4 years or so she has had insidious onset of shortness of breath that is progressive.  Worsened with exertion relieved by rest.  Currently exertional dyspnea for a flight of stairs and relieved by rest.  There is some associated wheezing and chest tightness.  She only gets episodes of cough 2 times a year when she gets sick but then these cough episodes can last several weeks.  There is no orthopnea proximal nocturnal dyspnea or hemoptysis or fever or chills.  She remembers in 2016 she had pulmonary function testing and apparently somebody told her that she had COPD but when I personally visualized the trace this looked more like restriction with normal diffusion capacity consistent with obesity.  In 2007 she had CT angiogram chest.  I personally visualized those lung fields and they look clear to me.  Nevertheless she has progressive dyspnea.  Of note, in August 2018 she had a fall at work and she had lumbar disc herniation with resultant loss of bladder and bowel function and is status post surgery with lumbar decompression microdiscectomy and she has slowly improved.  She says the last few years of her life have been very stressful with marital separation and these issues.  Exam nitric oxide today is 6 ppb and normal.  Of note she underwent cardiology work-up?  With Dr. Einar Deleon and apparently she has a leaky  heart valve or some issue.  She has been placed on nitrates after a cardiac stress test.  I suspect the primary issue is likely to be obesity related diastolic dysfunction    OV 10/31/2017   Chief Complaint  Patient presents with  . Follow-up    Pt states she has been having some chest pain x 1 month now. Pt states she had increased her physical activity and states it could be coming from that. Denies any current complaints of SOB or cough.   [name] , 36 y.o. , with dob [birth year] and Deleon ,Not Hispanic or Latino from [place] [id number] - presents to lung clinic for follow-up of test results for dyspnea workup. In the interim for the last 8 weeks she has been exercising doing daily walks. With this her shortness of breath is significantly improved. This nocough which she says she only gets once a year when she gets sick. Is no wheezing. This also has improved. However in the last 4 weeks she's got some atypical chest pain that happens randomly when she is sitting and resting but does not happen when she exerts herself. It is in the central sternal area. There is no radiation with this. It is mild and transient and fleeting.his is definitely new and only started after she started exercising. She's had a stress test with Dr. Einar Deleon  that is normal Her pulmonary function test shows mild restriction and spirometry but near normal DLCO and normal DLCO. Her high resolution CT chest that I personally visualized is normal     CAT COPD Symptom & Quality of Life Score (GSK trademark) 0 is no burden. 5 is highest burden 08/31/2017   Never Cough -> Cough all the time 3  No phlegm in chest -> Chest is full of phlegm 1  No chest tightness -> Chest feels very tight 3  No dyspnea for 1 flight stairs/hill -> Very dyspneic for 1 flight of stairs 4  No limitations for ADL at home -> Very limited with ADL at home 2  Confident leaving home -> Not at all confident leaving home 2  Sleep  soundly -> Do not sleep soundly because of lung condition 3  Lots of Energy -> No energy at all 5  TOTAL Score (max 40)  21    Results for [name], [name] (MRN [id number]) as of 10/31/2017 09:46  Ref. Range 08/31/2017 17:44  Nitric Oxide Unknown <5     Walking desaturation test on 08/31/2017 185 feet x 3 laps on ROOM AIR:  did not desaturate. Rest pulse ox was 99%, final pulse ox was 99%. HR response 99/min at rest to 106/min at peak exertion. Patient [name]  Did not Desaturate < 88% . [name] did not  Desaturated </= 3% points. [name] yes did get tachyardic   IMPRESSION:  1. No evidence of interstitial lung disease. 2. Mild air trapping indicative of small airways disease. 3. Hepatic steatosis.   Electronically Signed   By: [name] M.D.   On: 09/08/2017 11:47   Results for [name], [name] (MRN [id number]) as of 10/31/2017 09:46  Ref. Range 09/26/2014 16:10 10/31/2017 08:44  FVC-Pre Latest Units: L 2.91 3.05  FVC-%Pred-Pre Latest Units: % 68 72  Results for [name], [name] (MRN [id number]) as of 10/31/2017 09:46  Ref. Range 09/26/2014 16:10 10/31/2017 08:44  TLC Latest Units: L 4.47 4.53  TLC % pred Latest Units: % 79 80  Results for [name] (MRN [id number]) as of 10/31/2017 09:46  Ref. Range 09/26/2014 16:10 10/31/2017 08:44  FEV1-Pre Latest Units: L 2.41 2.49  FEV1-%Pred-Pre Latest Units: % 68 72  Results for [name] (MRN [id number]) as of 10/31/2017 09:46  Ref. Range 09/26/2014 16:10 10/31/2017 08:44  DLCO unc Latest Units: ml/min/mmHg 30.55 28.40  DLCO unc % pred Latest Units: % 103 95  Results for [name] (MRN [id number]) as of 10/31/2017 09:46  Ref. Range 09/26/2014 16:10 10/31/2017 08:44  FEV1FVC-%Pred-Pre Latest Units: % 99 98     has a past medical history of Back pain, Carpal tunnel syndrome, Complication of anesthesia, Diabetes mellitus without complication ([place]), Dyspnea, Family history of adverse reaction to anesthesia, Obesity, PONV  (postoperative nausea and vomiting), and Tricuspid valve regurgitation.   reports that she has never smoked. She has never used smokeless tobacco.  Past Surgical History:  Procedure Laterality Date  . CARDIAC CATHETERIZATION N/A 11/24/2015   Procedure: Left Heart Cath and Coronary Angiography;  Surgeon: Adrian Prows, MD;  Location: [facility];  Service: Cardiovascular;  Laterality: N/A;  . CESAREAN SECTION    . DILATION AND CURETTAGE OF UTERUS     x4  . LUMBAR LAMINECTOMY/DECOMPRESSION MICRODISCECTOMY N/A 12/14/2016   Procedure: LUMBAR DECOMPRESSION MICRODISCECTOMY L5-S1;  Surgeon: Melina Schools, MD;  Location: [facility];  Service: Orthopedics;  Laterality: N/A;  120 mins    Allergies  Allergen Reactions  . Latex Rash    Immunization History  Administered Date(s) Administered  . Influenza Split 02/03/2014  . Pneumococcal Polysaccharide-23 11/27/2012    Family History  Problem Relation Age of Onset  . Cancer Father   . Diabetes Father   . Heart disease Father   . Hypertension Father   . Cancer Maternal Grandmother   . Diabetes Maternal Grandfather   . Cancer Paternal Grandmother   . Diabetes Paternal Grandfather      Current Outpatient Medications:  .  albuterol (PROVENTIL HFA;VENTOLIN HFA) 108 (90 Base) MCG/ACT inhaler, Inhale 2 puffs into the lungs every 6 (six) hours as needed for wheezing or shortness of breath. , Disp: , Rfl:  .  aspirin 81 MG tablet, Take 81 mg by mouth daily., Disp: , Rfl:  .  atorvastatin (LIPITOR) 10 MG tablet, Take 10 mg daily by mouth., Disp: , Rfl:  .  cyclobenzaprine (FLEXERIL) 10 MG tablet, Take 1 tablet by mouth at bedtime., Disp: , Rfl:  .  diclofenac (VOLTAREN) 50 MG EC tablet, Take 1 tablet (50 mg total) by mouth 2 (two) times daily., Disp: 15 tablet, Rfl: 0 .  diphenhydramine-acetaminophen (TYLENOL PM) 25-500 MG TABS tablet, Take 1 tablet by mouth at bedtime as needed., Disp: , Rfl:  .  fluconazole (DIFLUCAN) 200 MG tablet, Take 200 mg  by mouth every other day., Disp: , Rfl: 6 .  fluticasone (FLONASE) 50 MCG/ACT nasal spray, INSTILL 2 PUFFS INTO EACH NOSTRIL EVERY NIGHT AS NEEDED FOR ALLERGIES, Disp: , Rfl: 3 .  gabapentin (NEURONTIN) 300 MG capsule, gabapentin 300 mg capsule, Disp: , Rfl:  .  Insulin Glargine (BASAGLAR KWIKPEN) 100 UNIT/ML SOPN, Basaglar KwikPen U-100 Insulin 100 unit/mL (3 mL) subcutaneous  20 UNITS (TITRATE AS DIRECTED) ONCE A DAY SUBCUTANEOUS 30 DAYS, Disp: , Rfl:  .  losartan (COZAAR) 100 MG tablet, Take 100 mg daily by mouth., Disp: , Rfl:  .  methocarbamol (ROBAXIN) 500 MG tablet, Take 1 tablet (500 mg total) by mouth 3 (three) times daily., Disp: 30 tablet, Rfl: 0 .  nitroGLYCERIN (NITROSTAT) 0.4 MG SL tablet, Place 0.4 mg under the tongue every 5 (five) minutes as needed for chest pain., Disp: , Rfl:    Review of Systems     Objective:   Physical Exam  Constitutional: She is oriented to person, place, and time. She appears well-developed and well-nourished. No distress.  HENT:  Head: Normocephalic and atraumatic.  Right Ear: External ear normal.  Left Ear: External ear normal.  Mouth/Throat: Oropharynx is clear and moist. No oropharyngeal exudate.  Eyes: Pupils are equal, round, and reactive to light. Conjunctivae and EOM are normal. Right eye exhibits no discharge. Left eye exhibits no discharge. No scleral icterus.  Neck: Normal range of motion. Neck supple. No JVD present. No tracheal deviation present. No thyromegaly present.  Cardiovascular: Normal rate, regular rhythm, normal heart sounds and intact distal pulses. Exam reveals no gallop and no friction rub.  No murmur heard. Pulmonary/Chest: Effort normal and breath sounds normal. No respiratory distress. She has no wheezes. She has no rales. She exhibits no tenderness.  Abdominal: Soft. Bowel sounds are normal. She exhibits no distension and no mass. There is no tenderness. There is no rebound and no guarding.  Musculoskeletal: Normal  range of motion. She exhibits no edema or tenderness.  Lymphadenopathy:    She has no cervical adenopathy.  Neurological: She is alert and oriented to person, place,  and time. She has normal reflexes. No cranial nerve deficit. She exhibits normal muscle tone. Coordination normal.  Skin: Skin is warm and dry. No rash noted. She is not diaphoretic. No erythema. No pallor.  Psychiatric: She has a normal mood and affect. Her behavior is normal. Judgment and thought content normal.  Vitals reviewed.  Vitals:   10/31/17 0956  BP: 122/82  Pulse: 92  SpO2: 98%  Weight: 258 lb (117 kg)  Height: 5\' 8"  (1.727 m)    Estimated body mass index is 39.23 kg/m as calculated from the following:   Height as of this encounter: 5\' 8"  (1.727 m).   Weight as of this encounter: 258 lb (117 kg).     Assessment:       ICD-10-CM   1. Dyspnea on exertion R06.09   2. Atypical chest pain R07.89     Chest pain is new - likely muscoloskeletal    Plan:      History, Tests and course  indicate that this is from weight, and loss of physical fitness after back surgery 2019  Glad better with exercise  Discuss chest pain with Dr. Einar Deleon   Followup 12 months or sooner if needed    Dr. Brand Males, M.D., [facility].C.P Pulmonary and Critical Care Medicine Staff Physician, [place] Director - Interstitial Lung Disease  Program  Pulmonary [place] [facility] [place], [place], [address]  Pager: [phone], If no answer or between  15:00h - 7:00h: call [phone] Telephone: [phone]

## 2017-10-31 NOTE — Progress Notes (Signed)
PFT done today. 

## 2017-10-31 NOTE — Patient Instructions (Addendum)
ICD-10-CM   1. Dyspnea on exertion R06.09   2. Atypical chest pain R07.89      History, Tests and course  indicate that this is from weight, and loss of physical fitness after back surgery 2019  Glad better with exercise  Discuss chest pain with Dr. Einar Gip   Followup 12 months or sooner if needed

## 2017-11-08 ENCOUNTER — Ambulatory Visit
Admission: RE | Admit: 2017-11-08 | Discharge: 2017-11-08 | Disposition: A | Discharge status: Home / Self Care | Payer: Federal, State, Local not specified - PPO | Source: Ambulatory Visit | Attending: Internal Medicine | Admitting: Internal Medicine

## 2017-11-08 DIAGNOSIS — IMO0002 Reserved for concepts with insufficient information to code with codable children: Secondary | ICD-10-CM

## 2017-11-08 DIAGNOSIS — R229 Localized swelling, mass and lump, unspecified: Principal | ICD-10-CM

## 2017-11-08 DIAGNOSIS — R2231 Localized swelling, mass and lump, right upper limb: Secondary | ICD-10-CM | POA: Diagnosis not present

## 2017-11-17 DIAGNOSIS — R351 Nocturia: Secondary | ICD-10-CM | POA: Diagnosis not present

## 2017-11-17 DIAGNOSIS — N3942 Incontinence without sensory awareness: Secondary | ICD-10-CM | POA: Diagnosis not present

## 2017-12-25 DIAGNOSIS — K080 Exfoliation of teeth due to systemic causes: Secondary | ICD-10-CM | POA: Diagnosis not present

## 2017-12-27 ENCOUNTER — Telehealth: Payer: Self-pay | Admitting: Gastroenterology

## 2017-12-27 NOTE — Telephone Encounter (Signed)
Ms. Duren was very upset that she took off from work today to come to an appointment that is actually scheduled for tomorrow. I apologized many times for the confusion.She tells me that she would not have agreed to come to an appointment tomorrow because she knew she could not make it. She says she was scheduled with an APP but back on June we would not have had the schedule for the APP available. I offered to put her in with Alonza Bogus today at 2pm but she declined. She wanted to be seen now not later. She said " This is ridiculous". She said "I hope something happens to me because I will sue the company for not seeing me today' She walked away very mad. She pushed one of our co-workers out of her way as she walked out the door. Then she slammed the door so loud the walls shook.

## 2017-12-28 ENCOUNTER — Encounter: Payer: Self-pay | Admitting: Gastroenterology

## 2017-12-28 ENCOUNTER — Ambulatory Visit: Payer: Federal, State, Local not specified - PPO | Admitting: Gastroenterology

## 2017-12-28 VITALS — BP 132/84 | HR 72 | Ht 68.0 in | Wt 261.8 lb

## 2017-12-28 DIAGNOSIS — R159 Full incontinence of feces: Secondary | ICD-10-CM | POA: Diagnosis not present

## 2017-12-28 DIAGNOSIS — K921 Melena: Secondary | ICD-10-CM

## 2017-12-28 DIAGNOSIS — K5901 Slow transit constipation: Secondary | ICD-10-CM | POA: Diagnosis not present

## 2017-12-28 NOTE — Progress Notes (Signed)
    History of Present Illness: This is a 37 year old female with constipation and fecal incontinence since 2018. I evaluated her in November 2018 for the same problems. She underwent L5-S1 lumbar decompression in 12/2016. She has been evaluated by Dr. Tomi Likens, Neurology, in January. MRI report from April 2019 reviewed.  She reports a bowel movement about every 3 to 4 days despite taking MiraLAX every day.  She has occasional episodes of incontinence.  She notes occasional mild crampy lower abdominal pain and increase in flatus. She occasionally notes small amounts of blood on the tissue paper after wiping.  Current Medications, Allergies, Past Medical History, Past Surgical History, Family History and Social History were reviewed in [employer] record.  Physical Exam: General: Well developed, well nourished, no acute distress Head: Normocephalic and atraumatic Eyes:  sclerae anicteric, EOMI Ears: Normal auditory acuity Mouth: No deformity or lesions Lungs: Clear throughout to auscultation Heart: Regular rate and rhythm; no murmurs, rubs or bruits Abdomen: Soft, non tender and non distended. No masses, hepatosplenomegaly or hernias noted. Normal Bowel sounds Rectal: Not odne Musculoskeletal: Symmetrical with no gross deformities  Pulses:  Normal pulses noted Extremities: No clubbing, cyanosis, edema or deformities noted Neurological: Alert oriented x 4, grossly nonfocal Psychological:  Alert and cooperative. Normal mood and affect  Assessment and Recommendations:  1. Fecal incontinence, constipation, hematochezia.  Advised to titrate laxatives for a complete bowel movement daily.  Increase MiraLAX to up to 3 times per day and/or follow-up with neurology Dulcolax tablets 1-2 times daily.  Follow up with Neurology.  PT referral for treatment options to help address fecal incontinence.  GI follow-up in 6 weeks.  Schedule colonoscopy when constipation is under better  control.

## 2017-12-28 NOTE — Patient Instructions (Addendum)
Please start over the counter Miralax mixing 17 grams in 8 oz of water 1-3 x daily to titrate to have a bowel movement daily. You can also take over the counter dulcolax 1-2 tablets daily along with Miralax.   Please schedule a follow-up appointment with Dr. Tomi Likens.   We have scheduled you an appointment with [facility] for pelvic floor therapy on 01/17/18 at 10:00am.  Thank you for choosing me and [facility].  Pricilla Riffle. Dagoberto Ligas., MD., [facility]

## 2018-01-01 ENCOUNTER — Encounter: Payer: Self-pay | Admitting: Neurology

## 2018-01-01 ENCOUNTER — Ambulatory Visit (INDEPENDENT_AMBULATORY_CARE_PROVIDER_SITE_OTHER): Payer: Federal, State, Local not specified - PPO | Admitting: Neurology

## 2018-01-01 VITALS — BP 106/76 | HR 84 | Ht 67.5 in | Wt 259.0 lb

## 2018-01-01 DIAGNOSIS — K592 Neurogenic bowel, not elsewhere classified: Secondary | ICD-10-CM

## 2018-01-01 DIAGNOSIS — N319 Neuromuscular dysfunction of bladder, unspecified: Secondary | ICD-10-CM

## 2018-01-01 DIAGNOSIS — M5417 Radiculopathy, lumbosacral region: Secondary | ICD-10-CM

## 2018-01-01 NOTE — Patient Instructions (Signed)
At this point, I do not have any other recommendation.  I defer continued management to your pain doctor, urologist and gastroenterologist

## 2018-01-01 NOTE — Progress Notes (Addendum)
NEUROLOGY FOLLOW UP OFFICE NOTE  [name] [id number]  HISTORY OF PRESENT ILLNESS: [name] is a 37 year old female with type 2 diabetes who follows up for fecal incontinece.    UPDATE: She underwent workup for fecal incontinence.  MRI of lumbar spine with and without contrast from 06/26/17 demonstrated (imaging not available "1. Postsurgical changes at L5-S1 without residual disc herniation.  Enhancing granulation tissue encasing descending right S1 nerve roots with mild mass effect and displacement of descending right S1 nerve roots without central stenosis.  2.  Mild L3-4 spinal canal stenosis with contact of descending right L4 nerve roots without displacement, likely not significantly changed.  3.  Mild L4-5 spinal canal stenosis, not significantly changed."  MRI of pelvis/sacrum SI joints with and without contrast from 08/23/17 was personally reviewed and  Degenerative disc disease at L5-S1 and postsurgical changes but no explanation for fecal incontinence.    Since last visit, the fecal incontinence has become constipation.  Miralax has been ineffective.  However, with valsalva maneuver (cough or sneeze) she has had stress fecal incontinence.  She is having pain in the right foot.  Legs improved.  She still has perineal numbness.  She has a neurogenic bladder.  She is referred to pelvic therapy.  She has colonoscopy scheduled. She is taking gabapentin 300mg  three times daily for back pain.    PAST MEDICAL HISTORY: Past Medical History:  Diagnosis Date  . Back pain   . Carpal tunnel syndrome    bilateral  . Complication of anesthesia    "hard to wake up"  . Diabetes mellitus without complication ([place])   . Dyspnea    with excersion  . Family history of adverse reaction to anesthesia    mom voniting  . Obesity   . PONV (postoperative nausea and vomiting)   . Tricuspid valve regurgitation    also mitral regurg- trace on both per echo    MEDICATIONS: Current Outpatient  Medications on File Prior to Visit  Medication Sig Dispense Refill  . albuterol (PROVENTIL HFA;VENTOLIN HFA) 108 (90 Base) MCG/ACT inhaler Inhale 2 puffs into the lungs every 6 (six) hours as needed for wheezing or shortness of breath.     Marland Kitchen aspirin 81 MG tablet Take 81 mg by mouth daily.    Marland Kitchen atorvastatin (LIPITOR) 10 MG tablet Take 10 mg daily by mouth.    . cyclobenzaprine (FLEXERIL) 10 MG tablet Take 1 tablet by mouth at bedtime.    . diphenhydramine-acetaminophen (TYLENOL PM) 25-500 MG TABS tablet Take 1 tablet by mouth at bedtime as needed.    . fluconazole (DIFLUCAN) 200 MG tablet Take 200 mg by mouth every other day.  6  . fluticasone (FLONASE) 50 MCG/ACT nasal spray INSTILL 2 PUFFS INTO EACH NOSTRIL EVERY NIGHT AS NEEDED FOR ALLERGIES  3  . gabapentin (NEURONTIN) 300 MG capsule Take 300 mg by mouth 3 (three) times daily.     . Insulin Glargine (BASAGLAR KWIKPEN) 100 UNIT/ML SOPN Basaglar KwikPen U-100 Insulin 100 unit/mL (3 mL) subcutaneous  72 UNITS (TITRATE AS DIRECTED) ONCE A DAY SUBCUTANEOUS 30 DAYS    . losartan (COZAAR) 100 MG tablet Take 100 mg daily by mouth.    . methocarbamol (ROBAXIN) 500 MG tablet Take 1 tablet (500 mg total) by mouth 3 (three) times daily. (Patient not taking: Reported on 10/31/2017) 30 tablet 0  . nitroGLYCERIN (NITROSTAT) 0.4 MG SL tablet Place 0.4 mg under the tongue every 5 (five) minutes as needed  for chest pain.    . TRULICITY 6.22 QJ/3.3LK SOPN Inject into the skin once a week.     No current facility-administered medications on file prior to visit.     ALLERGIES: Allergies  Allergen Reactions  . Latex Rash    FAMILY HISTORY: Family History  Problem Relation Age of Onset  . Cancer Father   . Diabetes Father   . Heart disease Father   . Hypertension Father   . Cancer Maternal Grandmother   . Diabetes Maternal Grandfather   . Cancer Paternal Grandmother   . Diabetes Paternal Grandfather    SOCIAL HISTORY: Social History    Socioeconomic History  . Marital status: Married    Spouse name: Not on file  . Number of children: Not on file  . Years of education: Not on file  . Highest education level: Not on file  Occupational History  . Not on file  Social Needs  . Financial resource strain: Not on file  . Food insecurity:    Worry: Not on file    Inability: Not on file  . Transportation needs:    Medical: Not on file    Non-medical: Not on file  Tobacco Use  . Smoking status: Never Smoker  . Smokeless tobacco: Never Used  Substance and Sexual Activity  . Alcohol use: No  . Drug use: No  . Sexual activity: Yes    Birth control/protection: None  Lifestyle  . Physical activity:    Days per week: Not on file    Minutes per session: Not on file  . Stress: Not on file  Relationships  . Social connections:    Talks on phone: Not on file    Gets together: Not on file    Attends religious service: Not on file    Active member of club or organization: Not on file    Attends meetings of clubs or organizations: Not on file    Relationship status: Not on file  . Intimate partner violence:    Fear of current or ex partner: Not on file    Emotionally abused: Not on file    Physically abused: Not on file    Forced sexual activity: Not on file  Other Topics Concern  . Not on file  Social History Narrative  . Not on file    REVIEW OF SYSTEMS: Constitutional: No fevers, chills, or sweats, no generalized fatigue, change in appetite Eyes: No visual changes, double vision, eye pain Ear, nose and throat: No hearing loss, ear pain, nasal congestion, sore throat Cardiovascular: No chest pain, palpitations Respiratory:  No shortness of breath at rest or with exertion, wheezes GastrointestinaI: fecal constipation/incontinence Genitourinary:  Urinary retention Musculoskeletal:  Back pain Integumentary: No rash, pruritus, skin lesions Neurological: as above Psychiatric: No depression, insomnia,  anxiety Endocrine: No palpitations, fatigue, diaphoresis, mood swings, change in appetite, change in weight, increased thirst Hematologic/Lymphatic:  No purpura, petechiae. Allergic/Immunologic: no itchy/runny eyes, nasal congestion, recent allergic reactions, rashes  PHYSICAL EXAM: Blood pressure 106/76, pulse 84, height 5' 7.5" (1.715 m), weight 259 lb (117.5 kg), last menstrual period 12/23/2017, SpO2 97 %. General: No acute distress.  Patient appears well-groomed.  Head:  Normocephalic/atraumatic Eyes:  Fundi examined but not visualized Neck: supple, no paraspinal tenderness, full range of motion Heart:  Regular rate and rhythm Lungs:  Clear to auscultation bilaterally Back: right sided back pain Neurological Exam: alert and oriented to person, place, and time. Attention span and concentration intact, recent and remote  memory intact, fund of knowledge intact.  Speech fluent and not dysarthric, language intact.  CN II-XII intact. Bulk and tone normal, muscle strength 5/5 but with difficulty standing and walking on toes of right foot.  Sensation to pinprick sensation reduced in dorsum of right foot. Vibration sensation intact. Deep tendon reflexes 1+ and symmetric in upper extremities and absent in lower extremities toes downgoing.  Finger to nose and heel to shin testing intact.  Gait normal.  Romberg negative.  IMPRESSION: 1.  Neurogenic bowel/neurogenic bladder:  Typically S2-S4 nerve roots supply this function.  MRI findings do not demonstrate any clear radiographic or structural evidence affecting these nerve roots.  She does not exhibit any myelopathic signs or other exam findings to suggest brain or spinal cord pathology.  I am inclined to think that her lower sacral nerve roots have been affected/irritated without any clear radiographic evidence.   2.  Right S1 radiculopathy secondary to L5-S1 disc extrusion, status post decompression with residual symptoms  PLAN: At this point, I have  no other recommendations.  I defer continued symptomatic management to pain medicine, urology and gastroenterology  19 minutes spent face to face with patient, over 50% spent discussing diagnosis and management.  Metta Clines, DO  CC:  Antony Contras, MD  Lucio Edward, MD

## 2018-01-15 ENCOUNTER — Encounter

## 2018-01-18 DIAGNOSIS — M6281 Muscle weakness (generalized): Secondary | ICD-10-CM | POA: Diagnosis not present

## 2018-01-18 DIAGNOSIS — N393 Stress incontinence (female) (male): Secondary | ICD-10-CM | POA: Diagnosis not present

## 2018-01-18 DIAGNOSIS — N3942 Incontinence without sensory awareness: Secondary | ICD-10-CM | POA: Diagnosis not present

## 2018-01-18 DIAGNOSIS — M62838 Other muscle spasm: Secondary | ICD-10-CM | POA: Diagnosis not present

## 2018-01-19 DIAGNOSIS — Z3200 Encounter for pregnancy test, result unknown: Secondary | ICD-10-CM | POA: Diagnosis not present

## 2018-01-19 DIAGNOSIS — Z113 Encounter for screening for infections with a predominantly sexual mode of transmission: Secondary | ICD-10-CM | POA: Diagnosis not present

## 2018-01-19 DIAGNOSIS — Z6841 Body Mass Index (BMI) 40.0 and over, adult: Secondary | ICD-10-CM | POA: Diagnosis not present

## 2018-01-19 DIAGNOSIS — Z01419 Encounter for gynecological examination (general) (routine) without abnormal findings: Secondary | ICD-10-CM | POA: Diagnosis not present

## 2018-01-23 DIAGNOSIS — R2230 Localized swelling, mass and lump, unspecified upper limb: Secondary | ICD-10-CM | POA: Diagnosis not present

## 2018-01-23 DIAGNOSIS — Z794 Long term (current) use of insulin: Secondary | ICD-10-CM | POA: Diagnosis not present

## 2018-01-23 DIAGNOSIS — E1165 Type 2 diabetes mellitus with hyperglycemia: Secondary | ICD-10-CM | POA: Diagnosis not present

## 2018-01-25 DIAGNOSIS — Z713 Dietary counseling and surveillance: Secondary | ICD-10-CM | POA: Diagnosis not present

## 2018-02-08 DIAGNOSIS — R102 Pelvic and perineal pain: Secondary | ICD-10-CM | POA: Diagnosis not present

## 2018-02-08 DIAGNOSIS — N130 Hydronephrosis with ureteropelvic junction obstruction: Secondary | ICD-10-CM | POA: Diagnosis not present

## 2018-02-08 DIAGNOSIS — M62838 Other muscle spasm: Secondary | ICD-10-CM | POA: Diagnosis not present

## 2018-02-08 DIAGNOSIS — M6281 Muscle weakness (generalized): Secondary | ICD-10-CM | POA: Diagnosis not present

## 2018-02-12 ENCOUNTER — Encounter: Payer: Self-pay | Admitting: Gastroenterology

## 2018-02-12 ENCOUNTER — Ambulatory Visit: Payer: Federal, State, Local not specified - PPO | Admitting: Gastroenterology

## 2018-02-12 VITALS — BP 120/76 | HR 67 | Ht 68.0 in | Wt 269.1 lb

## 2018-02-12 DIAGNOSIS — K921 Melena: Secondary | ICD-10-CM | POA: Diagnosis not present

## 2018-02-12 DIAGNOSIS — K5904 Chronic idiopathic constipation: Secondary | ICD-10-CM | POA: Diagnosis not present

## 2018-02-12 MED ORDER — NA SULFATE-K SULFATE-MG SULF 17.5-3.13-1.6 GM/177ML PO SOLN: 1.0000 | Freq: Once | ORAL | 0 refills | Status: AC

## 2018-02-12 MED ORDER — LINACLOTIDE 145 MCG PO CAPS: 145.0000 ug | ORAL_CAPSULE | Freq: Every day | ORAL | 11 refills | Status: AC | PRN

## 2018-02-12 NOTE — Progress Notes (Signed)
    History of Present Illness: This is a 37 year old female with chronic constipation.  She takes Linzess 145 mcg or 290 mcg every few days for management of constipation.  She frequently has rectal discomfort and small-volume rectal bleeding when constipated.  She continues to need Dulcolax and MiraLAX on occasion.  She feels her symptoms have improved on this regimen.  She continues with pelvic floor physical therapy which she feels has been beneficial.  Current Medications, Allergies, Past Medical History, Past Surgical History, Family History and Social History were reviewed in [employer] record.  Physical Exam: General: Well developed, well nourished, no acute distress Head: Normocephalic and atraumatic Eyes:  sclerae anicteric, EOMI Ears: Normal auditory acuity Mouth: No deformity or lesions Lungs: Clear throughout to auscultation Heart: Regular rate and rhythm; no murmurs, rubs or bruits Abdomen: Soft, non tender and non distended. No masses, hepatosplenomegaly or hernias noted. Normal Bowel sounds Rectal:  Deferred to colonoscopy Musculoskeletal: Symmetrical with no gross deformities  Pulses:  Normal pulses noted Extremities: No clubbing, cyanosis, edema or deformities noted Neurological: Alert oriented x 4, grossly nonfocal Psychological:  Alert and cooperative. Normal mood and affect  Assessment and Recommendations:  1. Chronic constipation and hematochezia.  Change Linzess to 145 mcg daily (not prn). Rule out colorectal neoplasms, hemorrhoids and other disorders.  Schedule colonoscopy. The risks (including bleeding, perforation, infection, missed lesions, medication reactions and possible hospitalization or surgery if complications occur), benefits, and alternatives to colonoscopy with possible biopsy and possible polypectomy were discussed with the patient and they consent to proceed.

## 2018-02-12 NOTE — Patient Instructions (Signed)
We have sent the following medications to your pharmacy for you to pick up at your convenience: [place] have been scheduled for a colonoscopy. Please follow written instructions given to you at your visit today.  Please pick up your prep supplies at the pharmacy within the next 1-3 days. If you use inhalers (even only as needed), please bring them with you on the day of your procedure. Your physician has requested that you go to www.startemmi.com and enter the access code given to you at your visit today. This web site gives a general overview about your procedure. However, you should still follow specific instructions given to you by our office regarding your preparation for the procedure.  Normal BMI (Body Mass Index- based on height and weight) is between 19 and 25. Your BMI today is Body mass index is 40.92 kg/m. Marland Kitchen Please consider follow up  regarding your BMI with your Primary Care Provider.  Thank you for choosing me and [facility].  Pricilla Riffle. Dagoberto Ligas., MD., Marval Regal

## 2018-02-13 ENCOUNTER — Encounter

## 2018-02-13 ENCOUNTER — Ambulatory Visit: Payer: Federal, State, Local not specified - PPO | Admitting: Neurology

## 2018-02-13 NOTE — Progress Notes (Signed)
Linzess 145 mcg has been approved from 01/14/18-02/13/19

## 2018-02-19 DIAGNOSIS — M6281 Muscle weakness (generalized): Secondary | ICD-10-CM | POA: Diagnosis not present

## 2018-02-19 DIAGNOSIS — M62838 Other muscle spasm: Secondary | ICD-10-CM | POA: Diagnosis not present

## 2018-02-19 DIAGNOSIS — R102 Pelvic and perineal pain: Secondary | ICD-10-CM | POA: Diagnosis not present

## 2018-02-19 DIAGNOSIS — N393 Stress incontinence (female) (male): Secondary | ICD-10-CM | POA: Diagnosis not present

## 2018-02-19 DIAGNOSIS — Z713 Dietary counseling and surveillance: Secondary | ICD-10-CM | POA: Diagnosis not present

## 2018-02-22 IMAGING — DX DG LUMBAR SPINE COMPLETE 4+V
5 series · 5 of 5 positions shown · non-contrast
Comparison: Lumbar spine radiograph dated 03/30/2015

CLINICAL DATA: 35-year-old female with back pain.  No known injury.

EXAM:
LUMBAR SPINE - COMPLETE 4+ VIEW

[l-spine ap]
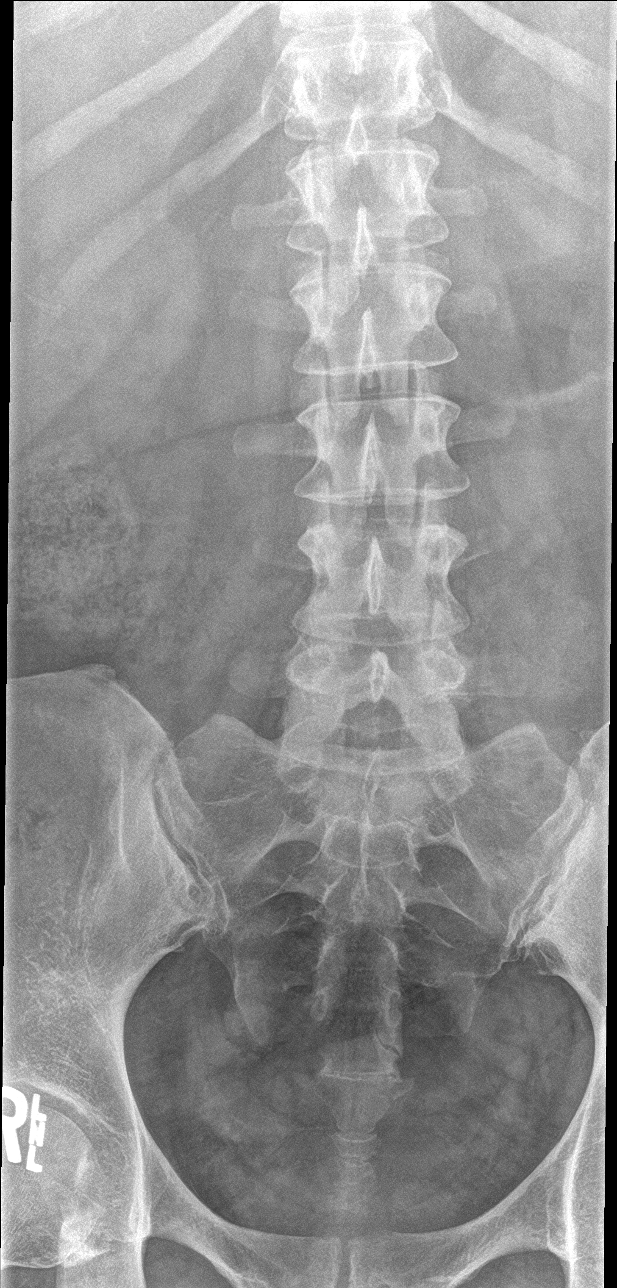

[l-spine obl (1 of 2)]
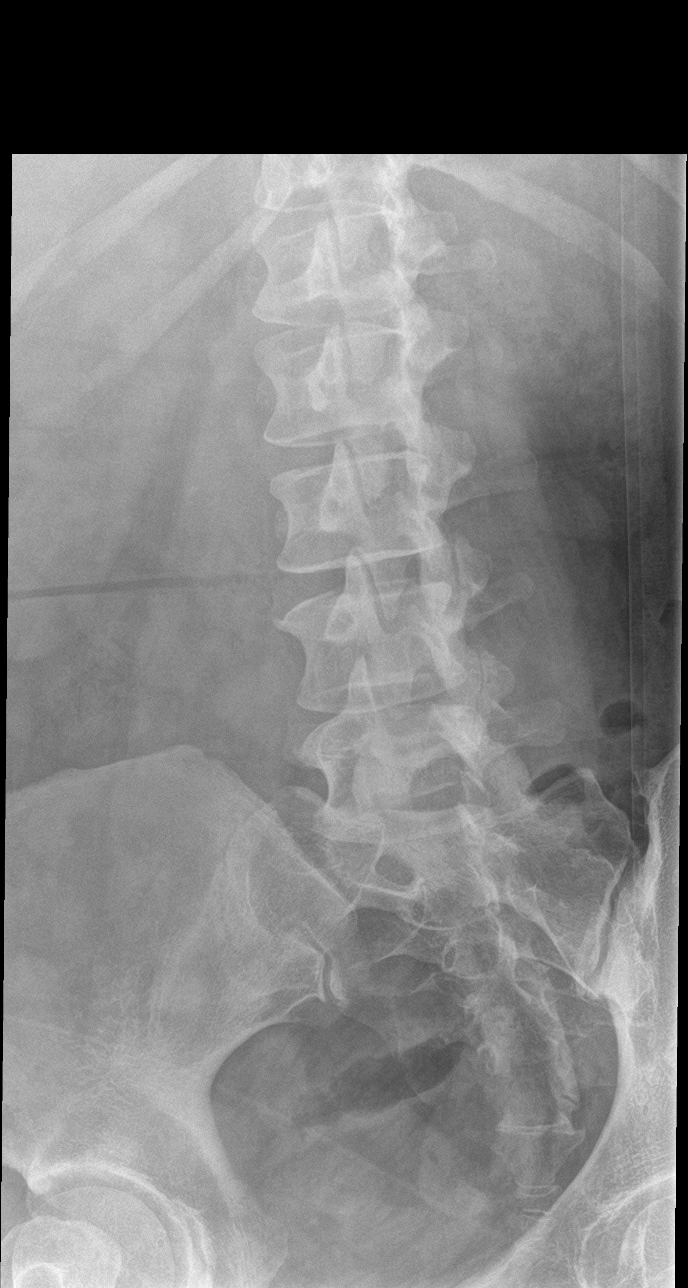

[l-spine obl (2 of 2)]
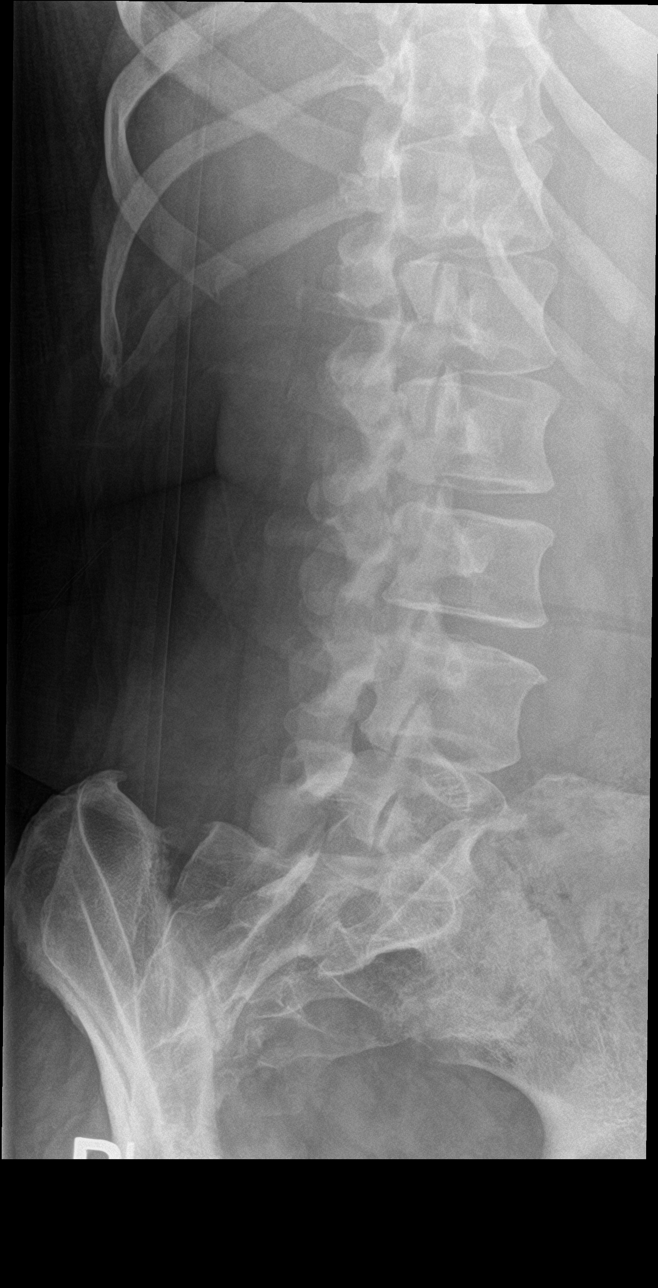

[l-spine lat]
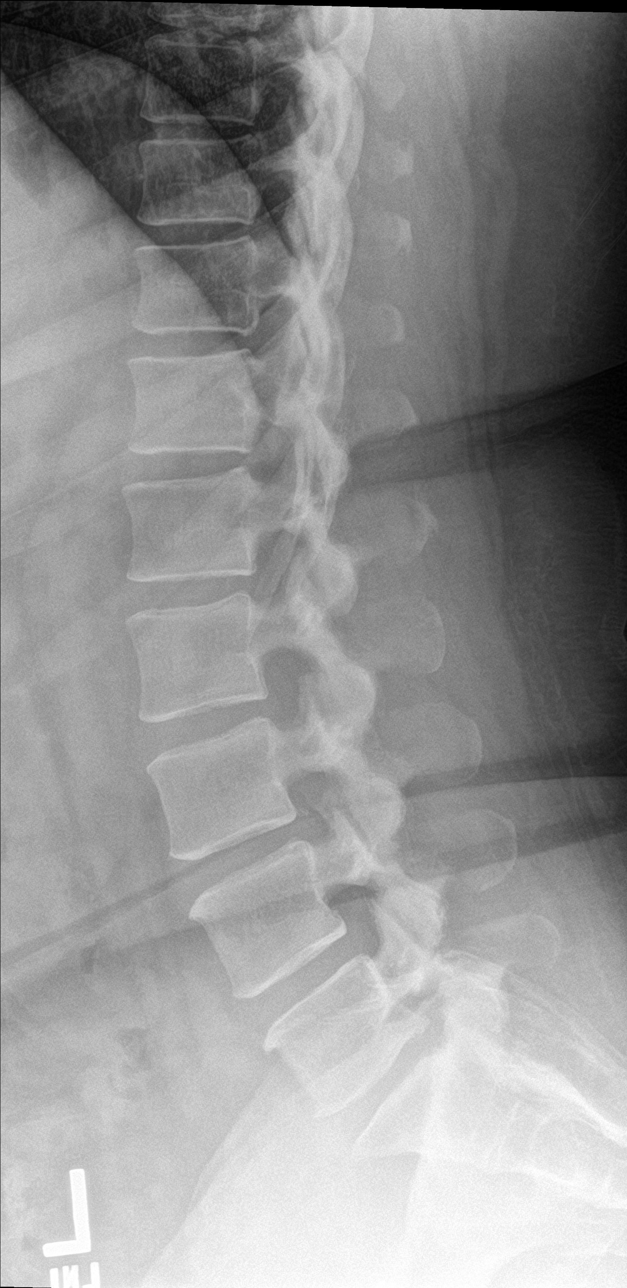

[l-spine spot]
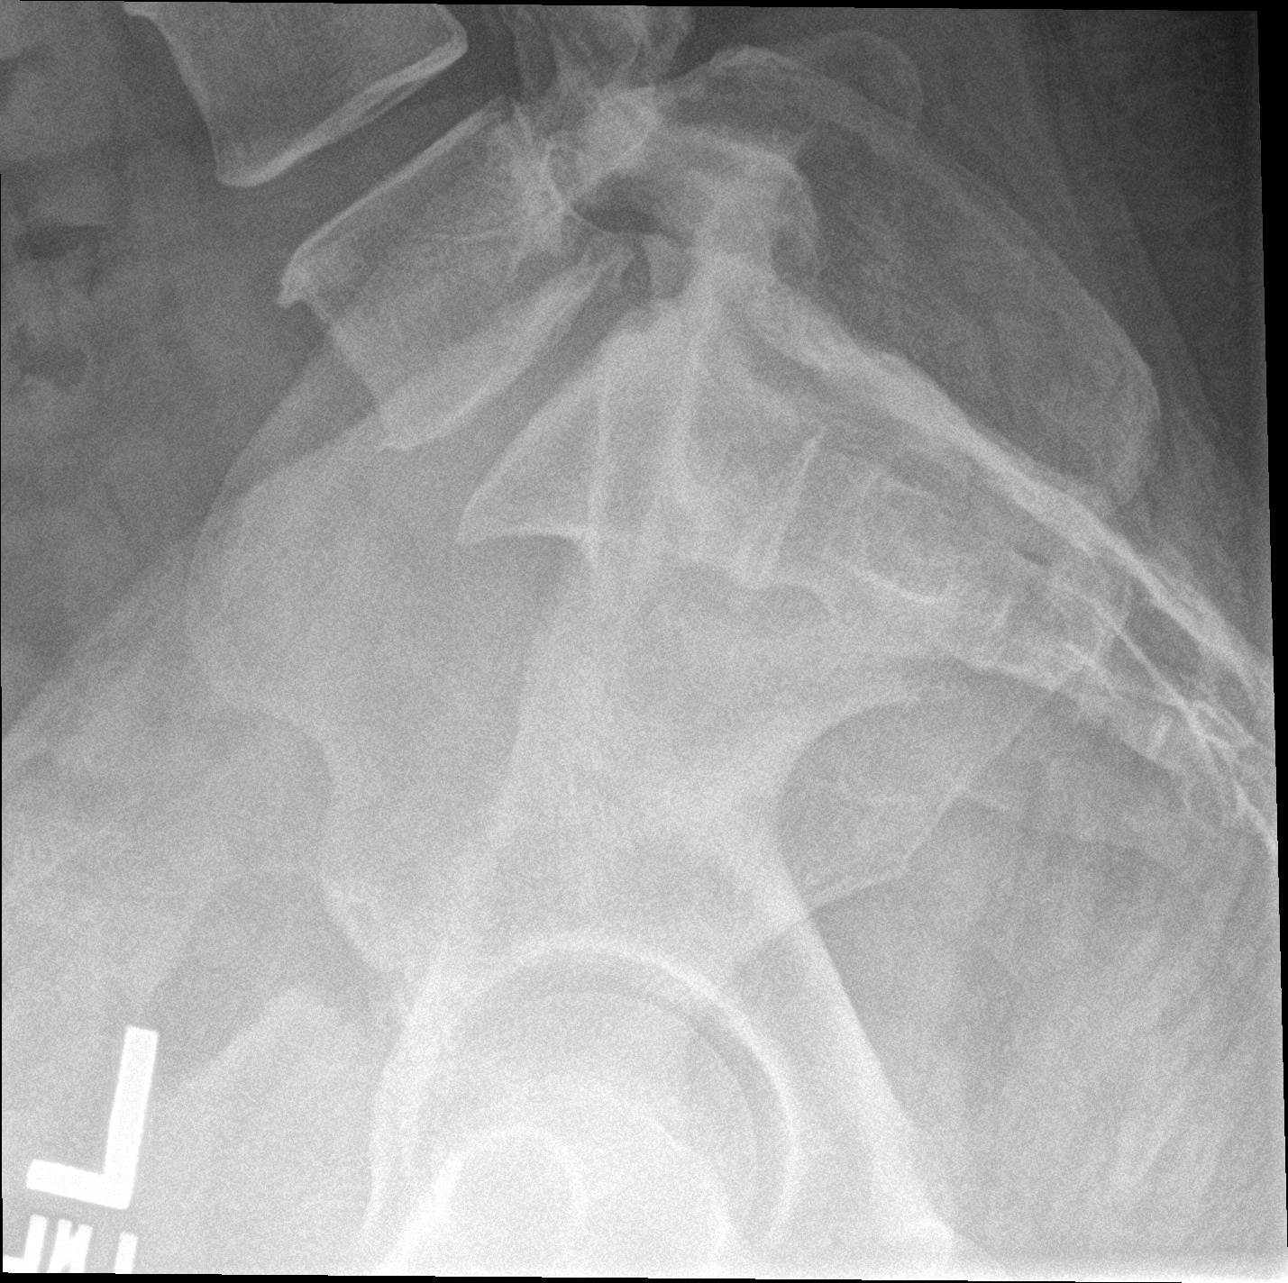

[5 of 5 positions shown; findings below may reference images not displayed]

FINDINGS: There is no evidence of lumbar spine fracture. Alignment is normal.
Intervertebral disc spaces are maintained.
IMPRESSION: Negative.

## 2018-03-05 DIAGNOSIS — M6289 Other specified disorders of muscle: Secondary | ICD-10-CM | POA: Diagnosis not present

## 2018-03-05 DIAGNOSIS — K5900 Constipation, unspecified: Secondary | ICD-10-CM | POA: Diagnosis not present

## 2018-03-05 DIAGNOSIS — M62838 Other muscle spasm: Secondary | ICD-10-CM | POA: Diagnosis not present

## 2018-03-05 DIAGNOSIS — M6281 Muscle weakness (generalized): Secondary | ICD-10-CM | POA: Diagnosis not present

## 2018-03-13 NOTE — Progress Notes (Signed)
NEUROLOGY FOLLOW UP OFFICE NOTE  [name] [id number]  HISTORY OF PRESENT ILLNESS: [name] is a 37 year old female with type 2 diabetes who follows up for bowel dysfunction.  UPDATE: 3 days ago she reports electric buzzing pain shooting from the right foot up the side of the leg.  No back pain.  The foot symptoms are constant but the shooting pain up the leg is usually noted with prolonged standing.  No weakness.  No recent activity that could have aggravated it.  She reports that she ran out of gabapentin a week ago.  HISTORY: In April 2018, she fractured toes in her right foot following a fall at work.  She said that her right knee may have buckled.  She had severe pain in the foot was was treated with pain medication for a while.  After she finished the main medication in May, she developed severe low back pain with radiating burning pain down the right leg and foot in late May 2018.  She presented to the ED on 5/30 for evaluation where lumbar spine radiograph was unremarkable.  Back and radicular pain persisted.  In July, she developed numbness in the perineal region down the leg and into the foot.  At this time, she lost bowel and bladder function.  She lost both bowel and bladder urgency and sensation of voiding.  She exhibited both bowel and bladder incontinence.  MRI of lumbar spine on 12/05/16 revealed large left central-right subarticular L5-S1 disc extrusion, causing severe central stenosis and impingement of the right S1 nerve root.  The conus was normal.  She underwent L5-S1 microdiscectomy and decompression on 12/14/16.  Since surgery, her pain has resolved.  Although modestly improved, she continues to have bowel and bladder dysfunction and perineal and radicular sensory loss down the right leg.  She denies abdominal pain, but notes pelvic pain.  She notes some nausea.  She has known bulging cervical disc, so she has chronic mild neck pain but nothing new or progressed.  She has  bilateral carpal tunnel syndrome, but no knew numbness in the hands.  She denies radicular pain or weakness in the upper extremities.  She has since had another fall in which it felt like her knee gave out.    She underwent workup for fecal incontinence.  MRI of lumbar spine with and without contrast from 06/26/17 demonstrated (imaging not available "1. Postsurgical changes at L5-S1 without residual disc herniation.  Enhancing granulation tissue encasing descending right S1 nerve roots with mild mass effect and displacement of descending right S1 nerve roots without central stenosis.  2.  Mild L3-4 spinal canal stenosis with contact of descending right L4 nerve roots without displacement, likely not significantly changed.  3.  Mild L4-5 spinal canal stenosis, not significantly changed."  MRI of pelvis/sacrum SI joints with and without contrast from 08/23/17 was personally reviewed and  Degenerative disc disease at L5-S1 and postsurgical changes but no explanation for fecal incontinence.    Fecal incontinence subsequently resolved and she started having chronic constipation.  Miralax has been ineffective.  However, with valsalva maneuver (cough or sneeze) she has had stress fecal incontinence.  She is having pain in the right foot.  Legs improved.  She still has perineal numbness.  She has a neurogenic bladder.  She is referred to pelvic therapy.  She has colonoscopy scheduled. She is taking gabapentin 300mg  three times daily for back pain.    PAST MEDICAL HISTORY: Past Medical History:  Diagnosis Date  .  Back pain   . Carpal tunnel syndrome    bilateral  . Complication of anesthesia    "hard to wake up"  . Diabetes mellitus without complication ([place])   . Dyspnea    with excersion  . Family history of adverse reaction to anesthesia    mom voniting  . Obesity   . PONV (postoperative nausea and vomiting)   . Tricuspid valve regurgitation    also mitral regurg- trace on both per echo     MEDICATIONS: Current Outpatient Medications on File Prior to Visit  Medication Sig Dispense Refill  . albuterol (PROVENTIL HFA;VENTOLIN HFA) 108 (90 Base) MCG/ACT inhaler Inhale 2 puffs into the lungs every 6 (six) hours as needed for wheezing or shortness of breath.     Marland Kitchen aspirin 81 MG tablet Take 81 mg by mouth daily.    Marland Kitchen atorvastatin (LIPITOR) 10 MG tablet Take 10 mg daily by mouth.    . cyclobenzaprine (FLEXERIL) 10 MG tablet Take 1 tablet by mouth at bedtime.    . diphenhydramine-acetaminophen (TYLENOL PM) 25-500 MG TABS tablet Take 1 tablet by mouth at bedtime as needed.    . fluconazole (DIFLUCAN) 200 MG tablet Take 200 mg by mouth every other day.  6  . fluticasone (FLONASE) 50 MCG/ACT nasal spray INSTILL 2 PUFFS INTO EACH NOSTRIL EVERY NIGHT AS NEEDED FOR ALLERGIES  3  . gabapentin (NEURONTIN) 300 MG capsule Take 300 mg by mouth 3 (three) times daily.     . Insulin Glargine (BASAGLAR KWIKPEN) 100 UNIT/ML SOPN Basaglar KwikPen U-100 Insulin 100 unit/mL (3 mL) subcutaneous  72 UNITS (TITRATE AS DIRECTED) ONCE A DAY SUBCUTANEOUS 30 DAYS    . linaclotide (LINZESS) 145 MCG CAPS capsule Take 1 capsule (145 mcg total) by mouth daily as needed. 30 capsule 11  . losartan (COZAAR) 100 MG tablet Take 100 mg daily by mouth.    . metFORMIN (GLUCOPHAGE) 500 MG tablet Take by mouth 4 (four) times daily. Takes 4 tablets every evening with a meal    . methocarbamol (ROBAXIN) 500 MG tablet Take 1 tablet (500 mg total) by mouth 3 (three) times daily. 30 tablet 0  . nitroGLYCERIN (NITROSTAT) 0.4 MG SL tablet Place 0.4 mg under the tongue every 5 (five) minutes as needed for chest pain.    Marland Kitchen oxybutynin (DITROPAN-XL) 10 MG 24 hr tablet Take 10 mg by mouth at bedtime.    . TRULICITY 9.37 JI/9.6VE SOPN Inject into the skin once a week.     No current facility-administered medications on file prior to visit.     ALLERGIES: Allergies  Allergen Reactions  . Latex Rash    FAMILY HISTORY: Family  History  Problem Relation Age of Onset  . Cancer Father   . Diabetes Father   . Heart disease Father   . Hypertension Father   . Cancer Maternal Grandmother   . Diabetes Maternal Grandfather   . Cancer Paternal Grandmother   . Diabetes Paternal Grandfather     SOCIAL HISTORY: Social History   Socioeconomic History  . Marital status: Married    Spouse name: Not on file  . Number of children: Not on file  . Years of education: Not on file  . Highest education level: Not on file  Occupational History  . Not on file  Social Needs  . Financial resource strain: Not on file  . Food insecurity:    Worry: Not on file    Inability: Not on file  . Transportation needs:  Medical: Not on file    Non-medical: Not on file  Tobacco Use  . Smoking status: Never Smoker  . Smokeless tobacco: Never Used  Substance and Sexual Activity  . Alcohol use: No  . Drug use: No  . Sexual activity: Yes    Birth control/protection: None  Lifestyle  . Physical activity:    Days per week: Not on file    Minutes per session: Not on file  . Stress: Not on file  Relationships  . Social connections:    Talks on phone: Not on file    Gets together: Not on file    Attends religious service: Not on file    Active member of club or organization: Not on file    Attends meetings of clubs or organizations: Not on file    Relationship status: Not on file  . Intimate partner violence:    Fear of current or ex partner: Not on file    Emotionally abused: Not on file    Physically abused: Not on file    Forced sexual activity: Not on file  Other Topics Concern  . Not on file  Social History Narrative  . Not on file    REVIEW OF SYSTEMS: Constitutional: No fevers, chills, or sweats, no generalized fatigue, change in appetite Eyes: No visual changes, double vision, eye pain Ear, nose and throat: No hearing loss, ear pain, nasal congestion, sore throat Cardiovascular: No chest pain,  palpitations Respiratory:  No shortness of breath at rest or with exertion, wheezes GastrointestinaI: No nausea, vomiting, diarrhea, abdominal pain, fecal incontinence Genitourinary:  No dysuria, urinary retention or frequency Musculoskeletal:  No neck pain, back pain Integumentary: No rash, pruritus, skin lesions Neurological: as above Psychiatric: No depression, insomnia, anxiety Endocrine: No palpitations, fatigue, diaphoresis, mood swings, change in appetite, change in weight, increased thirst Hematologic/Lymphatic:  No purpura, petechiae. Allergic/Immunologic: no itchy/runny eyes, nasal congestion, recent allergic reactions, rashes  PHYSICAL EXAM: Blood pressure 116/78, pulse 71, height 5\' 8"  (1.727 m), weight 271 lb (122.9 kg), SpO2 98 %. General: No acute distress.  Patient appears well-groomed.  Head:  Normocephalic/atraumatic Eyes:  Fundi examined but not visualized Neck: supple, no paraspinal tenderness, full range of motion Heart:  Regular rate and rhythm Lungs:  Clear to auscultation bilaterally Back: right lower paraspinal tenderness Neurological Exam: alert and oriented to person, place, and time. Attention span and concentration intact, recent and remote memory intact, fund of knowledge intact.  Speech fluent and not dysarthric, language intact.  CN II-XII intact. Bulk and tone normal, muscle strength 5/5 throughout.  Sensation to pinprick reduced in right lower extremity except for medial aspect of leg; vibration intact.  Deep tendon reflexes 2+ throughout, toes downgoing.  Finger to nose and heel to shin testing intact.  Gait normal, Romberg negative.  IMPRESSION: 1.  Lumbar radiculopathy.  Chronic/residual.  Likely flared up due to not having taken her gabapentin over the past week.   2.  Morbid obesity  PLAN: 1.  I will refill her gabapentin 300mg  three times daily for 1 month until she follows up with her spine/pain specialist. 2.  Recommend weight loss 3.  Follow up  as needed.  60 minutes spent with the patient, over 50% spent discussing diagnosis.  Metta Clines, DO  CC: Antony Contras, MD

## 2018-03-14 ENCOUNTER — Encounter: Payer: Self-pay | Admitting: Neurology

## 2018-03-14 ENCOUNTER — Ambulatory Visit: Payer: Federal, State, Local not specified - PPO | Admitting: Neurology

## 2018-03-14 VITALS — BP 116/78 | HR 71 | Ht 68.0 in | Wt 271.0 lb

## 2018-03-14 DIAGNOSIS — M5417 Radiculopathy, lumbosacral region: Secondary | ICD-10-CM

## 2018-03-14 MED ORDER — GABAPENTIN 300 MG PO CAPS: 300.0000 mg | ORAL_CAPSULE | Freq: Three times a day (TID) | ORAL | 0 refills | Status: DC

## 2018-03-14 NOTE — Patient Instructions (Signed)
The nerve pain is likely residual from your pinched nerve.  The gabapentin was likely treating it and when you ran out of gabapentin, you felt the pain.  I will send a refill for you until you see your pain doctor again.

## 2018-03-16 ENCOUNTER — Ambulatory Visit (AMBULATORY_SURGERY_CENTER): Payer: Federal, State, Local not specified - PPO | Admitting: Gastroenterology

## 2018-03-16 ENCOUNTER — Encounter: Payer: Self-pay | Admitting: Gastroenterology

## 2018-03-16 VITALS — BP 116/66 | HR 69 | Temp 99.1°F | Resp 18

## 2018-03-16 DIAGNOSIS — K921 Melena: Secondary | ICD-10-CM | POA: Diagnosis not present

## 2018-03-16 DIAGNOSIS — K641 Second degree hemorrhoids: Secondary | ICD-10-CM | POA: Diagnosis not present

## 2018-03-16 DIAGNOSIS — K5900 Constipation, unspecified: Secondary | ICD-10-CM | POA: Diagnosis not present

## 2018-03-16 DIAGNOSIS — K5909 Other constipation: Secondary | ICD-10-CM | POA: Diagnosis not present

## 2018-03-16 MED ORDER — SODIUM CHLORIDE 0.9 % IV SOLN: 500.0000 mL | Freq: Once | INTRAVENOUS | Status: DC

## 2018-03-16 NOTE — Progress Notes (Signed)
Report to PACU, RN, vss, BBS= Clear.  

## 2018-03-16 NOTE — Progress Notes (Signed)
Pt's states no medical or surgical changes since previsit or office visit. 

## 2018-03-16 NOTE — Op Note (Signed)
[facility] Patient Name: [name] Procedure Date: 03/16/2018 1:51 PM MRN: [id number] Endoscopist: Ladene Artist , MD Age: 37 Referring MD:  Date of Birth: 12/28/1980 Gender: Female Account #: [id number] Procedure:                Colonoscopy Indications:              Hematochezia, Chronic idiopathic constipation Medicines:                Monitored Anesthesia Care Procedure:                Pre-Anesthesia Assessment:                           - Prior to the procedure, a History and Physical                            was performed, and patient medications and                            allergies were reviewed. The patient's tolerance of                            previous anesthesia was also reviewed. The risks                            and benefits of the procedure and the sedation                            options and risks were discussed with the patient.                            All questions were answered, and informed consent                            was obtained. Prior Anticoagulants: The patient has                            taken no previous anticoagulant or antiplatelet                            agents. ASA Grade Assessment: III - A patient with                            severe systemic disease. After reviewing the risks                            and benefits, the patient was deemed in                            satisfactory condition to undergo the procedure.                           After obtaining informed consent, the colonoscope  was passed under direct vision. Throughout the                            procedure, the patient's blood pressure, pulse, and                            oxygen saturations were monitored continuously. The                            Colonoscope was introduced through the anus and                            advanced to the the cecum, identified by                            transillumination. The  ileocecal valve, appendiceal                            orifice, and rectum were photographed. The quality                            of the bowel preparation was adequate. The                            colonoscopy was performed without difficulty. The                            patient tolerated the procedure well. Scope In: 2:01:04 PM Scope Out: 2:16:25 PM Scope Withdrawal Time: 0 hours 11 minutes 57 seconds  Total Procedure Duration: 0 hours 15 minutes 21 seconds  Findings:                 The perianal and digital rectal examinations were                            normal.                           Internal hemorrhoids were found during                            retroflexion. The hemorrhoids were small and Grade                            I (internal hemorrhoids that do not prolapse).                           The exam was otherwise without abnormality on                            direct and retroflexion views. Complications:            No immediate complications. Estimated blood loss:                            None. Estimated Blood Loss:  Estimated blood loss: none. Impression:               - Internal hemorrhoids.                           - The examination was otherwise normal on direct                            and retroflexion views.                           - No specimens collected. Recommendation:           - Repeat colonoscopy at age 72 for screening                            purposes.                           - Patient has a contact number available for                            emergencies. The signs and symptoms of potential                            delayed complications were discussed with the                            patient. Return to normal activities tomorrow.                            Written discharge instructions were provided to the                            patient.                           - Resume previous diet.                           -  Continue present medications. Ladene Artist, MD 03/16/2018 2:19:05 PM This report has been signed electronically.

## 2018-03-16 NOTE — Patient Instructions (Signed)
Impression/Recommendations:  Hemorrhoid handout given to patient.  Repeat colonoscopy at age 37 for screening.  Resume previous diet. Continue present medications.  YOU HAD AN ENDOSCOPIC PROCEDURE TODAY AT [facility]:   Refer to the procedure report that was given to you for any specific questions about what was found during the examination.  If the procedure report does not answer your questions, please call your gastroenterologist to clarify.  If you requested that your care partner not be given the details of your procedure findings, then the procedure report has been included in a sealed envelope for you to review at your convenience later.  YOU SHOULD EXPECT: Some feelings of bloating in the abdomen. Passage of more gas than usual.  Walking can help get rid of the air that was put into your GI tract during the procedure and reduce the bloating. If you had a lower endoscopy (such as a colonoscopy or flexible sigmoidoscopy) you may notice spotting of blood in your stool or on the toilet paper. If you underwent a bowel prep for your procedure, you may not have a normal bowel movement for a few days.  Please Note:  You might notice some irritation and congestion in your nose or some drainage.  This is from the oxygen used during your procedure.  There is no need for concern and it should clear up in a day or so.  SYMPTOMS TO REPORT IMMEDIATELY:   Following lower endoscopy (colonoscopy or flexible sigmoidoscopy):  Excessive amounts of blood in the stool  Significant tenderness or worsening of abdominal pains  Swelling of the abdomen that is new, acute  Fever of 100F or higher  For urgent or emergent issues, a gastroenterologist can be reached at any hour by calling [phone].   DIET:  We do recommend a small meal at first, but then you may proceed to your regular diet.  Drink plenty of fluids but you should avoid alcoholic beverages for 24 hours.  ACTIVITY:  You  should plan to take it easy for the rest of today and you should NOT DRIVE or use heavy machinery until tomorrow (because of the sedation medicines used during the test).    FOLLOW UP: Our staff will call the number listed on your records the next business day following your procedure to check on you and address any questions or concerns that you may have regarding the information given to you following your procedure. If we do not reach you, we will leave a message.  However, if you are feeling well and you are not experiencing any problems, there is no need to return our call.  We will assume that you have returned to your regular daily activities without incident.  If any biopsies were taken you will be contacted by phone or by letter within the next 1-3 weeks.  Please call us at [phone] if you have not heard about the biopsies in 3 weeks.    SIGNATURES/CONFIDENTIALITY: You and/or your care partner have signed paperwork which will be entered into your electronic medical record.  These signatures attest to the fact that that the information above on your After Visit Summary has been reviewed and is understood.  Full responsibility of the confidentiality of this discharge information lies with you and/or your care-partner.

## 2018-03-19 ENCOUNTER — Telehealth: Payer: Self-pay

## 2018-03-19 DIAGNOSIS — M6289 Other specified disorders of muscle: Secondary | ICD-10-CM | POA: Diagnosis not present

## 2018-03-19 DIAGNOSIS — M6281 Muscle weakness (generalized): Secondary | ICD-10-CM | POA: Diagnosis not present

## 2018-03-19 DIAGNOSIS — Z713 Dietary counseling and surveillance: Secondary | ICD-10-CM | POA: Diagnosis not present

## 2018-03-19 DIAGNOSIS — M62838 Other muscle spasm: Secondary | ICD-10-CM | POA: Diagnosis not present

## 2018-03-19 DIAGNOSIS — K5900 Constipation, unspecified: Secondary | ICD-10-CM | POA: Diagnosis not present

## 2018-03-19 NOTE — Telephone Encounter (Signed)
  Follow up Call-  Call back number 03/16/2018  Post procedure Call Back phone  # [phone]  Permission to leave phone message Yes  Some recent data might be hidden     Patient questions:  Do you have a fever, pain , or abdominal swelling? No. Pain Score  0 *  Have you tolerated food without any problems? Yes.    Have you been able to return to your normal activities? Yes.    Do you have any questions about your discharge instructions: Diet   No. Medications  No. Follow up visit  No.  Do you have questions or concerns about your Care? No.  Actions: * If pain score is 4 or above: No action needed, pain <4.

## 2018-03-21 DIAGNOSIS — N3942 Incontinence without sensory awareness: Secondary | ICD-10-CM | POA: Diagnosis not present

## 2018-03-30 DIAGNOSIS — M25571 Pain in right ankle and joints of right foot: Secondary | ICD-10-CM | POA: Diagnosis not present

## 2018-03-30 DIAGNOSIS — M12271 Villonodular synovitis (pigmented), right ankle and foot: Secondary | ICD-10-CM | POA: Diagnosis not present

## 2018-03-30 DIAGNOSIS — M79671 Pain in right foot: Secondary | ICD-10-CM | POA: Diagnosis not present

## 2018-03-30 DIAGNOSIS — M7751 Other enthesopathy of right foot: Secondary | ICD-10-CM | POA: Diagnosis not present

## 2018-03-30 DIAGNOSIS — G5761 Lesion of plantar nerve, right lower limb: Secondary | ICD-10-CM | POA: Diagnosis not present

## 2018-04-02 DIAGNOSIS — M62838 Other muscle spasm: Secondary | ICD-10-CM | POA: Diagnosis not present

## 2018-04-02 DIAGNOSIS — N393 Stress incontinence (female) (male): Secondary | ICD-10-CM | POA: Diagnosis not present

## 2018-04-02 DIAGNOSIS — M6281 Muscle weakness (generalized): Secondary | ICD-10-CM | POA: Diagnosis not present

## 2018-04-02 DIAGNOSIS — R102 Pelvic and perineal pain: Secondary | ICD-10-CM | POA: Diagnosis not present

## 2018-04-16 ENCOUNTER — Other Ambulatory Visit: Payer: Self-pay | Admitting: Neurology

## 2018-04-19 DIAGNOSIS — G5761 Lesion of plantar nerve, right lower limb: Secondary | ICD-10-CM | POA: Diagnosis not present

## 2018-04-19 DIAGNOSIS — M7751 Other enthesopathy of right foot: Secondary | ICD-10-CM | POA: Diagnosis not present

## 2018-04-19 DIAGNOSIS — M6281 Muscle weakness (generalized): Secondary | ICD-10-CM | POA: Diagnosis not present

## 2018-04-19 DIAGNOSIS — M62838 Other muscle spasm: Secondary | ICD-10-CM | POA: Diagnosis not present

## 2018-04-19 DIAGNOSIS — N393 Stress incontinence (female) (male): Secondary | ICD-10-CM | POA: Diagnosis not present

## 2018-04-19 DIAGNOSIS — R102 Pelvic and perineal pain: Secondary | ICD-10-CM | POA: Diagnosis not present

## 2018-04-27 DIAGNOSIS — M62838 Other muscle spasm: Secondary | ICD-10-CM | POA: Diagnosis not present

## 2018-04-27 DIAGNOSIS — M6281 Muscle weakness (generalized): Secondary | ICD-10-CM | POA: Diagnosis not present

## 2018-04-27 DIAGNOSIS — N393 Stress incontinence (female) (male): Secondary | ICD-10-CM | POA: Diagnosis not present

## 2018-04-27 DIAGNOSIS — E538 Deficiency of other specified B group vitamins: Secondary | ICD-10-CM | POA: Diagnosis not present

## 2018-04-27 DIAGNOSIS — Z79899 Other long term (current) drug therapy: Secondary | ICD-10-CM | POA: Diagnosis not present

## 2018-04-27 DIAGNOSIS — Z794 Long term (current) use of insulin: Secondary | ICD-10-CM | POA: Diagnosis not present

## 2018-04-27 DIAGNOSIS — E1165 Type 2 diabetes mellitus with hyperglycemia: Secondary | ICD-10-CM | POA: Diagnosis not present

## 2018-04-27 DIAGNOSIS — R102 Pelvic and perineal pain: Secondary | ICD-10-CM | POA: Diagnosis not present

## 2018-05-28 DIAGNOSIS — R102 Pelvic and perineal pain: Secondary | ICD-10-CM | POA: Diagnosis not present

## 2018-05-28 DIAGNOSIS — M6281 Muscle weakness (generalized): Secondary | ICD-10-CM | POA: Diagnosis not present

## 2018-05-28 DIAGNOSIS — M62838 Other muscle spasm: Secondary | ICD-10-CM | POA: Diagnosis not present

## 2018-05-28 DIAGNOSIS — N393 Stress incontinence (female) (male): Secondary | ICD-10-CM | POA: Diagnosis not present

## 2018-06-05 DIAGNOSIS — M6289 Other specified disorders of muscle: Secondary | ICD-10-CM | POA: Diagnosis not present

## 2018-06-05 DIAGNOSIS — M62838 Other muscle spasm: Secondary | ICD-10-CM | POA: Diagnosis not present

## 2018-06-05 DIAGNOSIS — M6281 Muscle weakness (generalized): Secondary | ICD-10-CM | POA: Diagnosis not present

## 2018-06-05 DIAGNOSIS — K5900 Constipation, unspecified: Secondary | ICD-10-CM | POA: Diagnosis not present

## 2018-06-19 DIAGNOSIS — M6289 Other specified disorders of muscle: Secondary | ICD-10-CM | POA: Diagnosis not present

## 2018-06-19 DIAGNOSIS — K5900 Constipation, unspecified: Secondary | ICD-10-CM | POA: Diagnosis not present

## 2018-06-19 DIAGNOSIS — M62838 Other muscle spasm: Secondary | ICD-10-CM | POA: Diagnosis not present

## 2018-06-19 DIAGNOSIS — M6281 Muscle weakness (generalized): Secondary | ICD-10-CM | POA: Diagnosis not present

## 2018-06-25 DIAGNOSIS — R102 Pelvic and perineal pain: Secondary | ICD-10-CM | POA: Diagnosis not present

## 2018-06-25 DIAGNOSIS — G5761 Lesion of plantar nerve, right lower limb: Secondary | ICD-10-CM | POA: Diagnosis not present

## 2018-06-25 DIAGNOSIS — M6289 Other specified disorders of muscle: Secondary | ICD-10-CM | POA: Diagnosis not present

## 2018-06-25 DIAGNOSIS — M6281 Muscle weakness (generalized): Secondary | ICD-10-CM | POA: Diagnosis not present

## 2018-06-25 DIAGNOSIS — M62838 Other muscle spasm: Secondary | ICD-10-CM | POA: Diagnosis not present

## 2018-06-25 DIAGNOSIS — M7751 Other enthesopathy of right foot: Secondary | ICD-10-CM | POA: Diagnosis not present

## 2018-07-02 DIAGNOSIS — K080 Exfoliation of teeth due to systemic causes: Secondary | ICD-10-CM | POA: Diagnosis not present

## 2018-07-11 DIAGNOSIS — H6503 Acute serous otitis media, bilateral: Secondary | ICD-10-CM | POA: Diagnosis not present

## 2018-07-23 DIAGNOSIS — K080 Exfoliation of teeth due to systemic causes: Secondary | ICD-10-CM | POA: Diagnosis not present

## 2018-07-31 DIAGNOSIS — E1165 Type 2 diabetes mellitus with hyperglycemia: Secondary | ICD-10-CM | POA: Diagnosis not present

## 2018-07-31 DIAGNOSIS — Z79899 Other long term (current) drug therapy: Secondary | ICD-10-CM | POA: Diagnosis not present

## 2018-07-31 DIAGNOSIS — Z794 Long term (current) use of insulin: Secondary | ICD-10-CM | POA: Diagnosis not present

## 2018-09-06 DIAGNOSIS — G894 Chronic pain syndrome: Secondary | ICD-10-CM | POA: Diagnosis not present

## 2018-09-06 DIAGNOSIS — M47816 Spondylosis without myelopathy or radiculopathy, lumbar region: Secondary | ICD-10-CM | POA: Diagnosis not present

## 2018-09-27 DIAGNOSIS — N130 Hydronephrosis with ureteropelvic junction obstruction: Secondary | ICD-10-CM | POA: Diagnosis not present

## 2018-09-27 DIAGNOSIS — N3942 Incontinence without sensory awareness: Secondary | ICD-10-CM | POA: Diagnosis not present

## 2018-10-08 ENCOUNTER — Other Ambulatory Visit: Payer: Self-pay

## 2018-10-08 ENCOUNTER — Other Ambulatory Visit (HOSPITAL_COMMUNITY): Payer: Self-pay | Admitting: Physical Medicine and Rehabilitation

## 2018-10-08 ENCOUNTER — Ambulatory Visit (HOSPITAL_COMMUNITY)
Admission: RE | Admit: 2018-10-08 | Discharge: 2018-10-08 | Disposition: A | Discharge status: Home / Self Care | Payer: Federal, State, Local not specified - PPO | Source: Ambulatory Visit | Attending: Cardiovascular Disease | Admitting: Cardiovascular Disease

## 2018-10-08 DIAGNOSIS — M79604 Pain in right leg: Secondary | ICD-10-CM | POA: Diagnosis not present

## 2018-10-08 DIAGNOSIS — M7989 Other specified soft tissue disorders: Secondary | ICD-10-CM

## 2018-10-12 DIAGNOSIS — M7751 Other enthesopathy of right foot: Secondary | ICD-10-CM | POA: Diagnosis not present

## 2018-10-12 DIAGNOSIS — G5761 Lesion of plantar nerve, right lower limb: Secondary | ICD-10-CM | POA: Diagnosis not present

## 2018-11-02 DIAGNOSIS — G5761 Lesion of plantar nerve, right lower limb: Secondary | ICD-10-CM | POA: Diagnosis not present

## 2018-11-02 DIAGNOSIS — M7751 Other enthesopathy of right foot: Secondary | ICD-10-CM | POA: Diagnosis not present

## 2018-11-05 DIAGNOSIS — Z794 Long term (current) use of insulin: Secondary | ICD-10-CM | POA: Diagnosis not present

## 2018-11-05 DIAGNOSIS — Z5181 Encounter for therapeutic drug level monitoring: Secondary | ICD-10-CM | POA: Diagnosis not present

## 2018-11-05 DIAGNOSIS — E1165 Type 2 diabetes mellitus with hyperglycemia: Secondary | ICD-10-CM | POA: Diagnosis not present

## 2018-11-23 DIAGNOSIS — E119 Type 2 diabetes mellitus without complications: Secondary | ICD-10-CM | POA: Diagnosis not present

## 2018-11-23 DIAGNOSIS — G5761 Lesion of plantar nerve, right lower limb: Secondary | ICD-10-CM | POA: Diagnosis not present

## 2018-11-23 DIAGNOSIS — M7751 Other enthesopathy of right foot: Secondary | ICD-10-CM | POA: Diagnosis not present

## 2018-12-12 DIAGNOSIS — Z7189 Other specified counseling: Secondary | ICD-10-CM | POA: Diagnosis not present

## 2018-12-12 DIAGNOSIS — E1165 Type 2 diabetes mellitus with hyperglycemia: Secondary | ICD-10-CM | POA: Diagnosis not present

## 2018-12-12 DIAGNOSIS — E782 Mixed hyperlipidemia: Secondary | ICD-10-CM | POA: Diagnosis not present

## 2018-12-14 DIAGNOSIS — G5761 Lesion of plantar nerve, right lower limb: Secondary | ICD-10-CM | POA: Diagnosis not present

## 2018-12-14 DIAGNOSIS — M7751 Other enthesopathy of right foot: Secondary | ICD-10-CM | POA: Diagnosis not present

## 2018-12-17 DIAGNOSIS — M47896 Other spondylosis, lumbar region: Secondary | ICD-10-CM | POA: Diagnosis not present

## 2018-12-17 DIAGNOSIS — G894 Chronic pain syndrome: Secondary | ICD-10-CM | POA: Diagnosis not present

## 2019-01-04 DIAGNOSIS — G5761 Lesion of plantar nerve, right lower limb: Secondary | ICD-10-CM | POA: Diagnosis not present

## 2019-01-04 DIAGNOSIS — M7751 Other enthesopathy of right foot: Secondary | ICD-10-CM | POA: Diagnosis not present

## 2019-01-08 DIAGNOSIS — M47816 Spondylosis without myelopathy or radiculopathy, lumbar region: Secondary | ICD-10-CM | POA: Diagnosis not present

## 2019-01-09 DIAGNOSIS — F331 Major depressive disorder, recurrent, moderate: Secondary | ICD-10-CM | POA: Diagnosis not present

## 2019-01-10 ENCOUNTER — Other Ambulatory Visit: Payer: Self-pay | Admitting: Family Medicine

## 2019-01-10 DIAGNOSIS — R229 Localized swelling, mass and lump, unspecified: Secondary | ICD-10-CM

## 2019-01-10 DIAGNOSIS — R030 Elevated blood-pressure reading, without diagnosis of hypertension: Secondary | ICD-10-CM | POA: Diagnosis not present

## 2019-01-10 DIAGNOSIS — M545 Low back pain: Secondary | ICD-10-CM | POA: Diagnosis not present

## 2019-01-10 DIAGNOSIS — E782 Mixed hyperlipidemia: Secondary | ICD-10-CM | POA: Diagnosis not present

## 2019-01-19 IMAGING — DX DG CHEST 2V
2 series · 2 of 2 positions shown · non-contrast
Comparison: September 08, 2015

CLINICAL DATA: Cough and shortness of breath

EXAM:
CHEST - 2 VIEW

[chest pa]
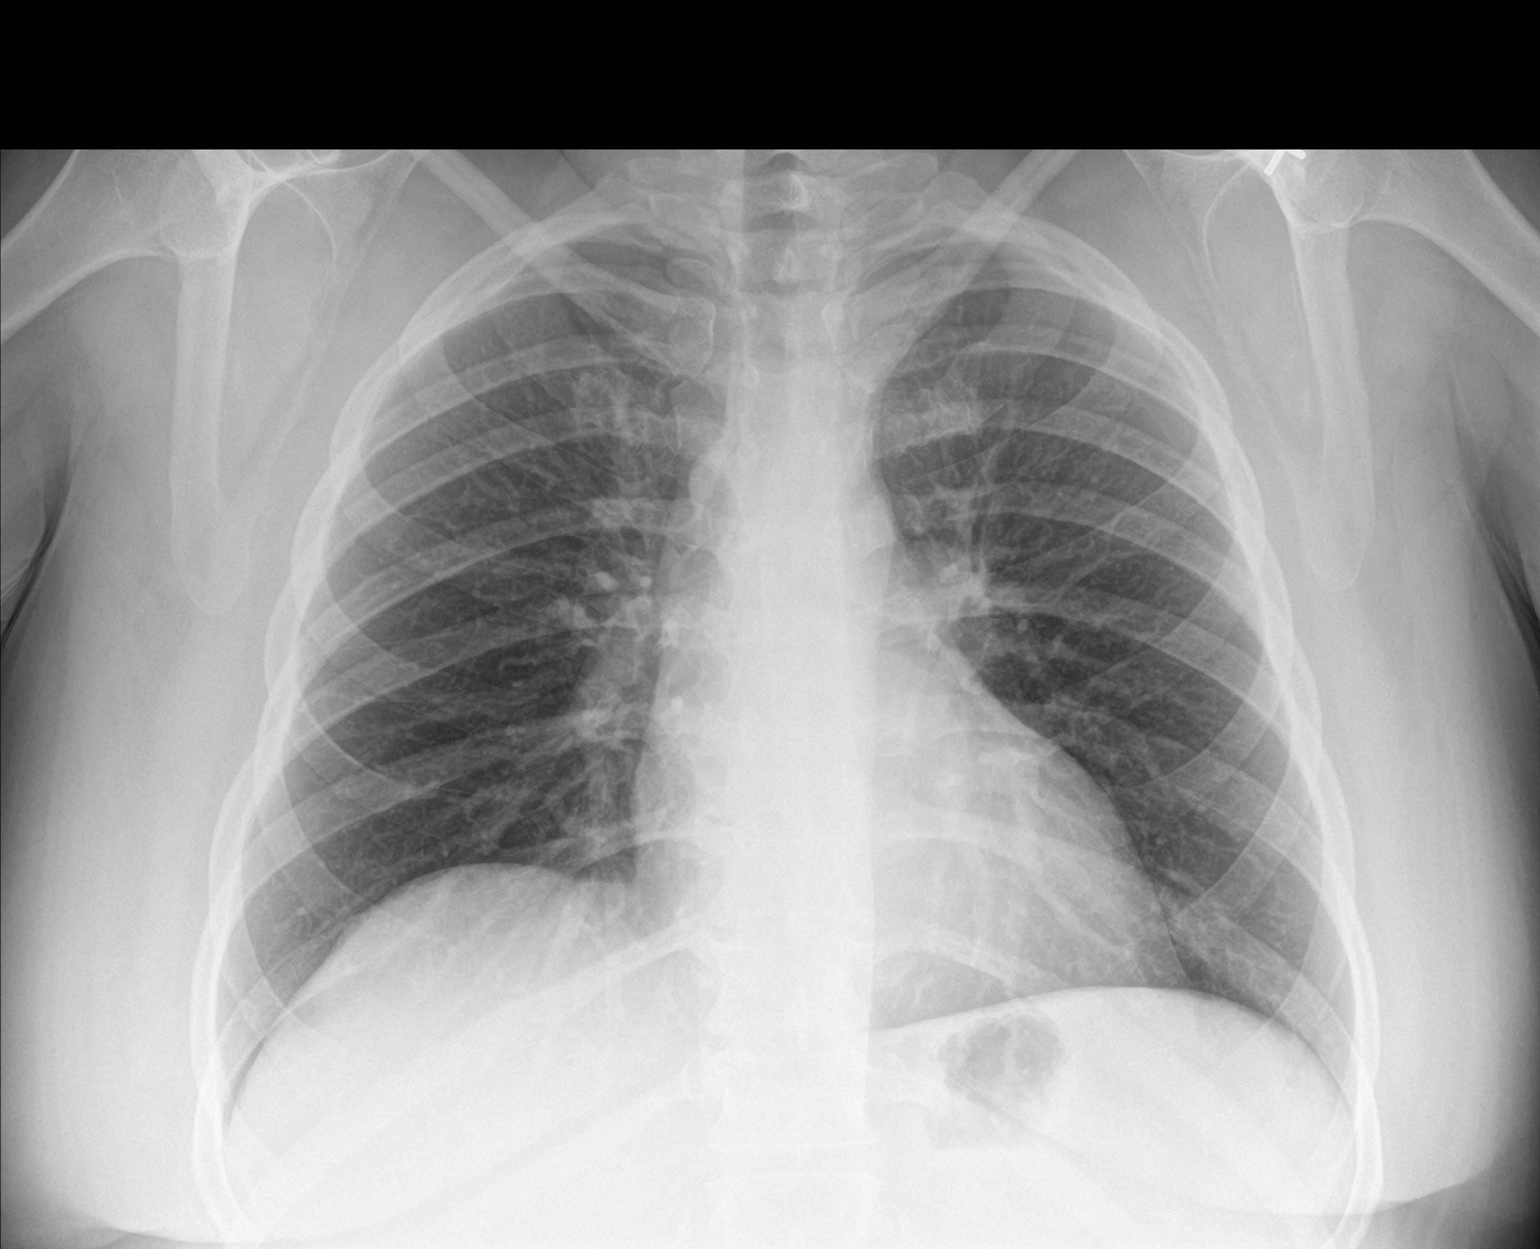

[chest lat]
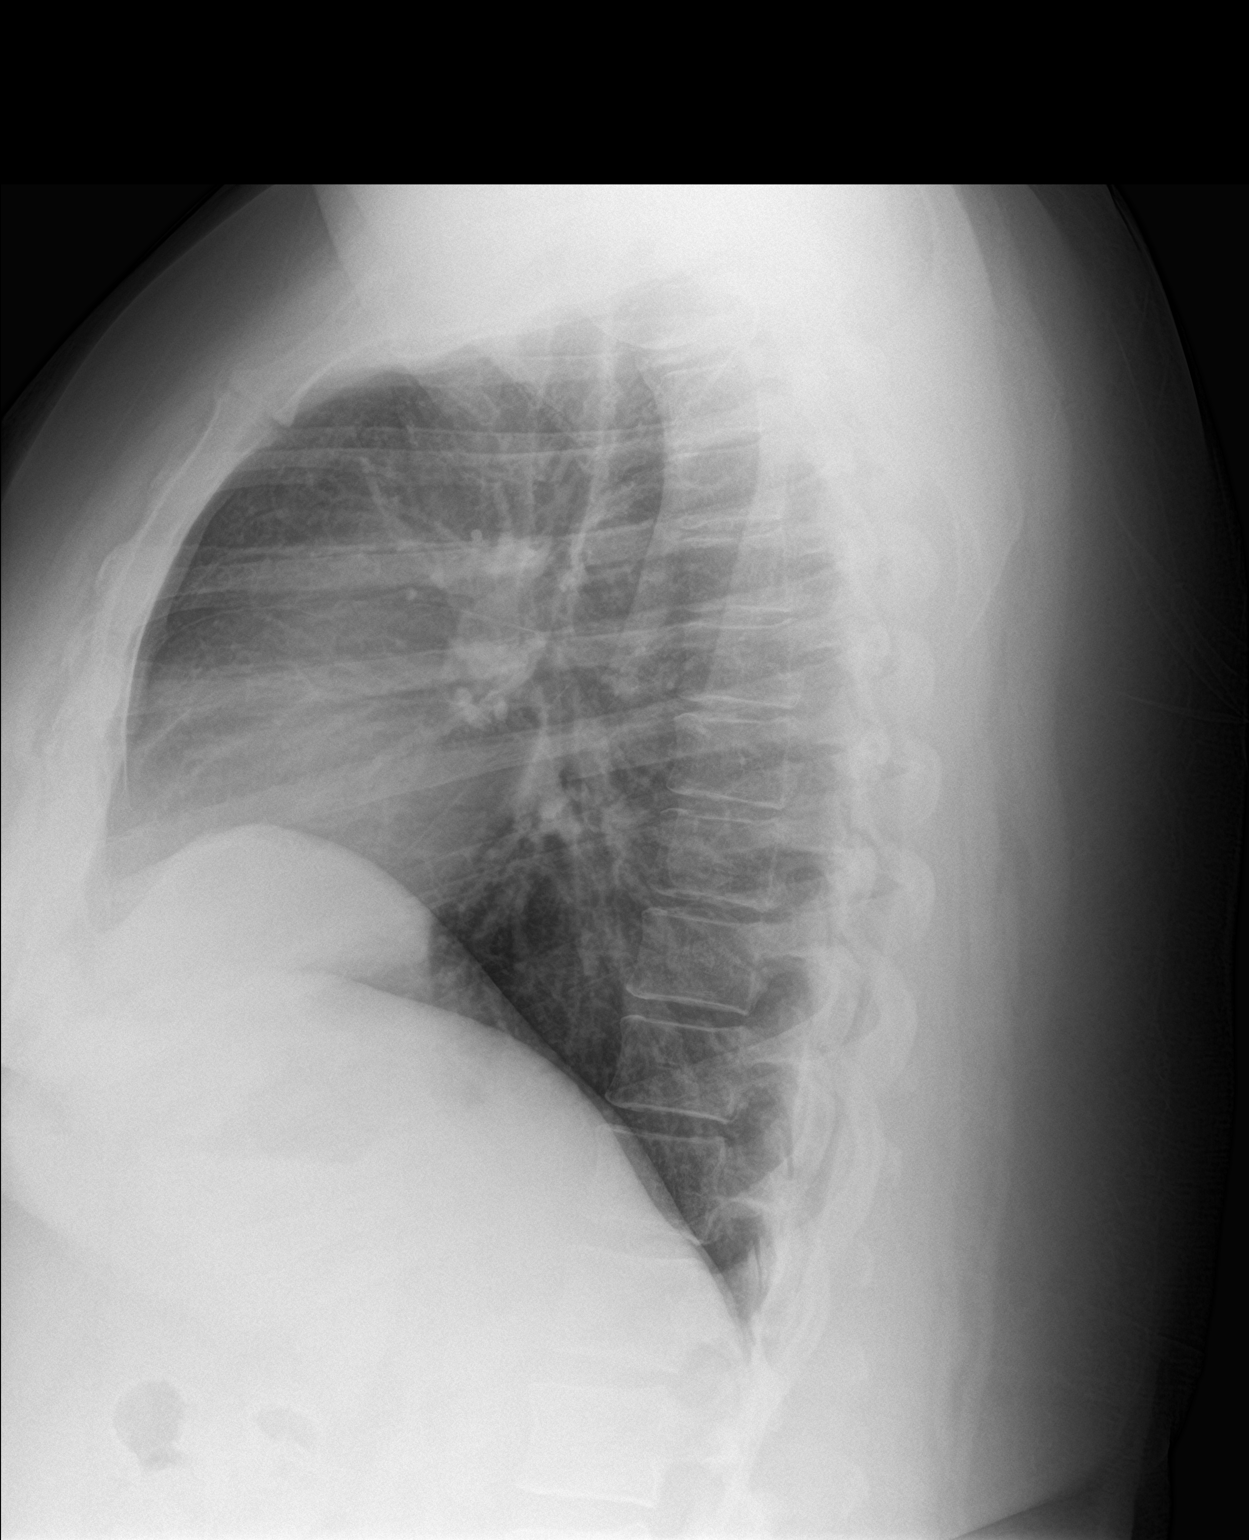

[2 of 2 positions shown; findings below may reference images not displayed]

FINDINGS: No edema or consolidation. Heart size and pulmonary vascularity are
normal. No adenopathy. No bone lesions.
IMPRESSION: No edema or consolidation.

## 2019-01-21 DIAGNOSIS — Z01419 Encounter for gynecological examination (general) (routine) without abnormal findings: Secondary | ICD-10-CM | POA: Diagnosis not present

## 2019-01-21 DIAGNOSIS — Z6841 Body Mass Index (BMI) 40.0 and over, adult: Secondary | ICD-10-CM | POA: Diagnosis not present

## 2019-01-21 DIAGNOSIS — Z113 Encounter for screening for infections with a predominantly sexual mode of transmission: Secondary | ICD-10-CM | POA: Diagnosis not present

## 2019-01-22 ENCOUNTER — Ambulatory Visit
Admission: RE | Admit: 2019-01-22 | Discharge: 2019-01-22 | Disposition: A | Discharge status: Home / Self Care | Payer: Federal, State, Local not specified - PPO | Source: Ambulatory Visit | Attending: Family Medicine | Admitting: Family Medicine

## 2019-01-22 DIAGNOSIS — R229 Localized swelling, mass and lump, unspecified: Secondary | ICD-10-CM

## 2019-01-22 DIAGNOSIS — R2231 Localized swelling, mass and lump, right upper limb: Secondary | ICD-10-CM | POA: Diagnosis not present

## 2019-01-22 DIAGNOSIS — E782 Mixed hyperlipidemia: Secondary | ICD-10-CM | POA: Diagnosis not present

## 2019-01-25 DIAGNOSIS — G5761 Lesion of plantar nerve, right lower limb: Secondary | ICD-10-CM | POA: Diagnosis not present

## 2019-01-25 DIAGNOSIS — M7751 Other enthesopathy of right foot: Secondary | ICD-10-CM | POA: Diagnosis not present

## 2019-01-30 DIAGNOSIS — M5136 Other intervertebral disc degeneration, lumbar region: Secondary | ICD-10-CM | POA: Diagnosis not present

## 2019-01-30 DIAGNOSIS — M961 Postlaminectomy syndrome, not elsewhere classified: Secondary | ICD-10-CM | POA: Diagnosis not present

## 2019-01-30 DIAGNOSIS — M5137 Other intervertebral disc degeneration, lumbosacral region: Secondary | ICD-10-CM | POA: Diagnosis not present

## 2019-02-08 ENCOUNTER — Ambulatory Visit: Payer: Self-pay | Admitting: General Surgery

## 2019-02-08 DIAGNOSIS — D1739 Benign lipomatous neoplasm of skin and subcutaneous tissue of other sites: Secondary | ICD-10-CM | POA: Diagnosis not present

## 2019-02-11 DIAGNOSIS — E118 Type 2 diabetes mellitus with unspecified complications: Secondary | ICD-10-CM | POA: Diagnosis not present

## 2019-02-18 DIAGNOSIS — Z794 Long term (current) use of insulin: Secondary | ICD-10-CM | POA: Diagnosis not present

## 2019-02-18 DIAGNOSIS — E1165 Type 2 diabetes mellitus with hyperglycemia: Secondary | ICD-10-CM | POA: Diagnosis not present

## 2019-02-18 DIAGNOSIS — E538 Deficiency of other specified B group vitamins: Secondary | ICD-10-CM | POA: Diagnosis not present

## 2019-02-18 DIAGNOSIS — Z79899 Other long term (current) drug therapy: Secondary | ICD-10-CM | POA: Diagnosis not present

## 2019-02-20 DIAGNOSIS — F331 Major depressive disorder, recurrent, moderate: Secondary | ICD-10-CM | POA: Diagnosis not present

## 2019-02-22 DIAGNOSIS — G5761 Lesion of plantar nerve, right lower limb: Secondary | ICD-10-CM | POA: Diagnosis not present

## 2019-02-22 DIAGNOSIS — M7751 Other enthesopathy of right foot: Secondary | ICD-10-CM | POA: Diagnosis not present

## 2019-03-13 DIAGNOSIS — F4323 Adjustment disorder with mixed anxiety and depressed mood: Secondary | ICD-10-CM | POA: Diagnosis not present

## 2019-03-22 DIAGNOSIS — G5761 Lesion of plantar nerve, right lower limb: Secondary | ICD-10-CM | POA: Diagnosis not present

## 2019-03-22 DIAGNOSIS — M25571 Pain in right ankle and joints of right foot: Secondary | ICD-10-CM | POA: Diagnosis not present

## 2019-03-27 DIAGNOSIS — G894 Chronic pain syndrome: Secondary | ICD-10-CM | POA: Diagnosis not present

## 2019-03-28 ENCOUNTER — Other Ambulatory Visit (HOSPITAL_COMMUNITY)
Admission: RE | Admit: 2019-03-28 | Discharge: 2019-03-28 | Disposition: A | Discharge status: Home / Self Care | Payer: Federal, State, Local not specified - PPO | Source: Ambulatory Visit | Attending: General Surgery | Admitting: General Surgery

## 2019-03-28 DIAGNOSIS — Z01812 Encounter for preprocedural laboratory examination: Secondary | ICD-10-CM | POA: Diagnosis not present

## 2019-03-28 DIAGNOSIS — Z20828 Contact with and (suspected) exposure to other viral communicable diseases: Secondary | ICD-10-CM | POA: Diagnosis not present

## 2019-03-28 NOTE — Patient Instructions (Addendum)
DUE TO COVID-19 ONLY ONE VISITOR IS ALLOWED TO COME WITH YOU AND STAY IN THE WAITING ROOM ONLY DURING PRE OP AND PROCEDURE DAY OF SURGERY. THE 1 VISITOR MAY VISIT WITH YOU AFTER SURGERY IN YOUR PRIVATE ROOM DURING VISITING HOURS ONLY!   ONCE YOUR COVID TEST IS COMPLETED, PLEASE BEGIN THE QUARANTINE INSTRUCTIONS AS OUTLINED IN YOUR HANDOUT.                [name]    Your procedure is scheduled on: 04/01/19   Report to [facility] Main  Entrance   Report to Short Stay at 5:30 AM     Call this number if you have problems the morning of surgery [name], NO CHEWING GUM [name].   Do not eat food After Midnight.   YOU MAY HAVE CLEAR LIQUIDS FROM MIDNIGHT UNTIL 4:30 AM.   At 4:30AM Please finish the prescribed Pre-Surgery Gatorade drink.   Nothing by mouth after you finish the Gatorade drink !   Take these medicines the morning of surgery with A SIP OF WATER: Gabapentin, Ditropan  How to Manage Your Diabetes Before and After Surgery  Why is it important to control my blood sugar before and after surgery? . Improving blood sugar levels before and after surgery helps healing and can limit problems. . A way of improving blood sugar control is eating a healthy diet by: o  Eating less sugar and carbohydrates o  Increasing activity/exercise o  Talking with your doctor about reaching your blood sugar goals . High blood sugars (greater than 180 mg/dL) can raise your risk of infections and slow your recovery, so you will need to focus on controlling your diabetes during the weeks before surgery. . Make sure that the doctor who takes care of your diabetes knows about your planned surgery including the date and location.  How do I manage my blood sugar before surgery? . Check your blood sugar at least 4 times a day, starting 2 days before surgery, to make sure that the level is not too high or  low. o Check your blood sugar the morning of your surgery when you wake up and every 2 hours until you get to the Short Stay unit. . If your blood sugar is less than 70 mg/dL, you will need to treat for low blood sugar: o Do not take insulin. o Treat a low blood sugar (less than 70 mg/dL) with  cup of clear juice (cranberry or apple), 4 glucose tablets, OR glucose gel. o Recheck blood sugar in 15 minutes after treatment (to make sure it is greater than 70 mg/dL). If your blood sugar is not greater than 70 mg/dL on recheck, call [phone] for further instructions. . Report your blood sugar to the short stay nurse when you get to Short Stay.  . If you are admitted to the hospital after surgery: o Your blood sugar will be checked by the staff and you will probably be given insulin after surgery (instead of oral diabetes medicines) to make sure you have good blood sugar levels. o The goal for blood sugar control after surgery is 80-180 mg/dL.   WHAT DO I DO ABOUT MY DIABETES MEDICATION?  Marland Kitchen Do not take oral diabetes medicines (pills) the morning of surgery.  . THE Day BEFORE SURGERY, take  50   units of       insulin.       Marland Kitchen  THE MORNING OF SURGERY, take0   units of         insulin.  . The day of surgery, do not take other diabetes injectables, including Byetta (exenatide), Bydureon (exenatide ER), Victoza (liraglutide), or Trulicity (dulaglutide).  . If your CBG is greater than 220 mg/dL, you may take  of your sliding scale  . (correction) dose of insulin.    e   Patient Signature:  Date:   Nurse Signature:  Date:   Reviewed and Endorsed by [facility] Patient Education Committee, August 2015                               You may not have any metal on your body including hair pins and              piercings  Do not wear jewelry, make-up, lotions, powders or perfumes, deodorant             Do not wear nail polish on your fingernails.  Do not shave  48 hours prior to surgery.             Do not bring valuables to the hospital. [place].  Contacts, dentures or bridgework may not be worn into surgery.       Patients discharged the day of surgery will not be allowed to drive home.   IF YOU ARE HAVING SURGERY AND GOING HOME THE SAME DAY, YOU MUST HAVE AN ADULT TO DRIVE YOU HOME AND BE WITH YOU FOR 24 HOURS.   YOU MAY GO HOME BY TAXI OR UBER OR ORTHERWISE, BUT AN ADULT MUST ACCOMPANY YOU HOME AND STAY WITH YOU FOR 24 HOURS.  Name and phone number of your driver:  Special Instructions: N/A              Please read over the following fact sheets you were given: _____________________________________________________________________             [facility] - Preparing for Surgery  Before surgery, you can play an important role.   Because skin is not sterile, your skin needs to be as free of germs as possible.   You can reduce the number of germs on your skin by washing with CHG (chlorahexidine gluconate) soap before surgery.   CHG is an antiseptic cleaner which kills germs and bonds with the skin to continue killing germs even after washing. Please DO NOT use if you have an allergy to CHG or antibacterial soaps.   If your skin becomes reddened/irritated stop using the CHG and inform your nurse when you arrive at Short Stay. Do not shave (including legs and underarms) for at least 48 hours prior to the first CHG shower.    Please follow these instructions carefully:  1.  Shower with CHG Soap the night before surgery and the  morning of Surgery.  2.  If you choose to wash your hair, wash your hair first as usual with your  normal  shampoo.  3.  After you shampoo, rinse your hair and body thoroughly to remove the  shampoo.                                        4.  Use  CHG as you would any other liquid soap.  You can apply chg directly  to the skin and wash                       Gently with a scrungie or clean  washcloth.  5.  Apply the CHG Soap to your body ONLY FROM THE NECK DOWN.   Do not use on face/ open                           Wound or open sores. Avoid contact with eyes, ears mouth and genitals (private parts).                       Wash face,  Genitals (private parts) with your normal soap.             6.  Wash thoroughly, paying special attention to the area where your surgery  will be performed.  7.  Thoroughly rinse your body with warm water from the neck down.  8.  DO NOT shower/wash with your normal soap after using and rinsing off  the CHG Soap.             9.  Pat yourself dry with a clean towel.            10.  Wear clean pajamas.            11.  Place clean sheets on your bed the night of your first shower and do not  sleep with pets. Day of Surgery : Do not apply any lotions/deodorants the morning of surgery.  Please wear clean clothes to the hospital/surgery center.  FAILURE TO FOLLOW THESE INSTRUCTIONS MAY RESULT IN THE CANCELLATION OF YOUR SURGERY PATIENT SIGNATURE_________________________________  NURSE SIGNATURE__________________________________  ________________________________________________________________________

## 2019-03-29 ENCOUNTER — Encounter (HOSPITAL_COMMUNITY): Payer: Self-pay

## 2019-03-29 ENCOUNTER — Other Ambulatory Visit: Payer: Self-pay

## 2019-03-29 ENCOUNTER — Encounter (HOSPITAL_COMMUNITY)
Admission: RE | Admit: 2019-03-29 | Discharge: 2019-03-29 | Disposition: A | Discharge status: Home / Self Care | Payer: Federal, State, Local not specified - PPO | Source: Ambulatory Visit | Attending: General Surgery | Admitting: General Surgery

## 2019-03-29 DIAGNOSIS — Z794 Long term (current) use of insulin: Secondary | ICD-10-CM | POA: Diagnosis not present

## 2019-03-29 DIAGNOSIS — E119 Type 2 diabetes mellitus without complications: Secondary | ICD-10-CM | POA: Insufficient documentation

## 2019-03-29 DIAGNOSIS — R2231 Localized swelling, mass and lump, right upper limb: Secondary | ICD-10-CM | POA: Insufficient documentation

## 2019-03-29 DIAGNOSIS — Z01818 Encounter for other preprocedural examination: Secondary | ICD-10-CM | POA: Insufficient documentation

## 2019-03-29 DIAGNOSIS — Z7982 Long term (current) use of aspirin: Secondary | ICD-10-CM | POA: Insufficient documentation

## 2019-03-29 DIAGNOSIS — Z79899 Other long term (current) drug therapy: Secondary | ICD-10-CM | POA: Insufficient documentation

## 2019-03-29 LAB — BASIC METABOLIC PANEL
Anion gap: 11 (ref 5–15)
BUN: 7 mg/dL (ref 6–20)
CO2: 26 mmol/L (ref 22–32)
Calcium: 8.7 mg/dL — ABNORMAL LOW (ref 8.9–10.3)
Chloride: 99 mmol/L (ref 98–111)
Creatinine, Ser: 0.54 mg/dL (ref 0.44–1.00)
GFR calc Af Amer: >60 mL/min (ref >60)
GFR calc non Af Amer: >60 mL/min (ref >60)
Glucose, Bld: 330 mg/dL — ABNORMAL HIGH (ref 70–99)
Potassium: 4 mmol/L (ref 3.5–5.1)
Sodium: 136 mmol/L (ref 135–145)

## 2019-03-29 LAB — CBC
HCT: 42.5 % (ref 36.0–46.0)
Hemoglobin: 14.1 g/dL (ref 12.0–15.0)
MCH: 29.9 pg (ref 26.0–34.0)
MCHC: 33.2 g/dL (ref 30.0–36.0)
MCV: 90 fL (ref 80.0–100.0)
Platelets: 320 10*3/uL (ref 150–400)
RBC: 4.72 MIL/uL (ref 3.87–5.11)
RDW: 12.4 % (ref 11.5–15.5)
WBC: 8.6 10*3/uL (ref 4.0–10.5)
nRBC: 0 % (ref 0.0–0.2)

## 2019-03-29 LAB — HEMOGLOBIN A1C
Hgb A1c MFr Bld: 10.2 % — ABNORMAL HIGH (ref 4.8–5.6)
Mean Plasma Glucose: 246.04 mg/dL

## 2019-03-29 LAB — GLUCOSE, CAPILLARY: Glucose-Capillary: 365 mg/dL — ABNORMAL HIGH (ref 70–99)

## 2019-03-29 LAB — NOVEL CORONAVIRUS, NAA (HOSP ORDER, SEND-OUT TO REF LAB; TAT 18-24 HRS): SARS-CoV-2, NAA: NOT DETECTED

## 2019-03-29 MED ORDER — ENSURE PRE-SURGERY PO LIQD
296.0000 mL | Freq: Once | ORAL | Status: DC
  Filled 2019-03-29: qty 296

## 2019-03-29 NOTE — Progress Notes (Signed)
Anesthesia Chart Review   Case: [id number] Date/Time: 04/01/19 0715   Procedure: EXCISION RIGHT ARM MASS (Right )   Anesthesia type: General   Pre-op diagnosis: ARM MASS   Location: [facility]   Surgeon: Mickeal Skinner, MD      DISCUSSION:38 y.o. never smoker with h/o PONV, poorly controlled DM II, right arm mass scheduled for above procedure 04/01/2019 with Dr. Gurney Maxin.   Elevated blood glucose 330, A1C 10.2.  Surgeon made aware.   VS: BP (!) 143/76 (BP Location: Left Arm)   Pulse 78   Temp 36.8 C (Oral)   Resp 18   Ht 5\' 7"  (1.702 m)   Wt 123.9 kg   LMP 03/29/2019   SpO2 98%   BMI 42.77 kg/m   PROVIDERS: Antony Contras, MD is PCP    LABS: Labs reviewed: Acceptable for surgery. (all labs ordered are listed, but only abnormal results are displayed)  Labs Reviewed  BASIC METABOLIC PANEL - Abnormal; Notable for the following components:      Result Value   Glucose, Bld 330 (*)    Calcium 8.7 (*)    All other components within normal limits  GLUCOSE, CAPILLARY - Abnormal; Notable for the following components:   Glucose-Capillary 365 (*)    All other components within normal limits  CBC  HEMOGLOBIN A1C     IMAGES:   EKG: 03/29/2019 Rate 78 bpm Normal sinus rhythm Left axis deviation Moderate voltage criteria for LVH, may be normal variant Abnormal ECG  CV: Cardiac Cath 11/24/15  The left ventricular systolic function is normal.  Normal coronary arteries, slow filling noted especially in the left coronary artery, improved with intracoronary nitroglycerin. Suggests microvascular disease.    Rec: Medical therapy with aggressive risk factor reduction.  Past Medical History:  Diagnosis Date  . Back pain   . Carpal tunnel syndrome    bilateral  . Complication of anesthesia    "hard to wake up"  . Diabetes mellitus without complication ([place])   . Dyspnea    with excersion  . Family history of adverse reaction to anesthesia    mom  voniting  . Obesity   . PONV (postoperative nausea and vomiting)   . Tricuspid valve regurgitation    also mitral regurg- trace on both per echo    Past Surgical History:  Procedure Laterality Date  . CARDIAC CATHETERIZATION N/A 11/24/2015   Procedure: Left Heart Cath and Coronary Angiography;  Surgeon: Adrian Prows, MD;  Location: [place] CV LAB;  Service: Cardiovascular;  Laterality: N/A;  . CESAREAN SECTION    . DILATION AND CURETTAGE OF UTERUS     x4  . LUMBAR LAMINECTOMY/DECOMPRESSION MICRODISCECTOMY N/A 12/14/2016   Procedure: LUMBAR DECOMPRESSION MICRODISCECTOMY L5-S1;  Surgeon: Melina Schools, MD;  Location: [place];  Service: Orthopedics;  Laterality: N/A;  120 mins    MEDICATIONS: . albuterol (PROVENTIL HFA;VENTOLIN HFA) 108 (90 Base) MCG/ACT inhaler  . aspirin 81 MG tablet  . atorvastatin (LIPITOR) 10 MG tablet  . Biotin 10 MG CAPS  . Cholecalciferol (DIALYVITE VITAMIN D 5000) 125 MCG (5000 UT) capsule  . Cyanocobalamin (B-12 PO)  . cyclobenzaprine (FLEXERIL) 10 MG tablet  . diphenhydrAMINE (BENADRYL) 25 MG tablet  . Dulaglutide (TRULICITY) 1.5 0000000 SOPN  . fluconazole (DIFLUCAN) 200 MG tablet  . fluticasone (FLONASE) 50 MCG/ACT nasal spray  . gabapentin (NEURONTIN) 300 MG capsule  . HYDROcodone-acetaminophen (NORCO/VICODIN) 5-325 MG tablet  . Ibuprofen-Famotidine (DUEXIS) 800-26.6 MG TABS  .  Insulin Glargine (BASAGLAR KWIKPEN) 100 UNIT/ML SOPN  . linaclotide (LINZESS) 145 MCG CAPS capsule  . losartan (COZAAR) 25 MG tablet  . Melatonin 3 MG CAPS  . metFORMIN (GLUCOPHAGE-XR) 500 MG 24 hr tablet  . methocarbamol (ROBAXIN) 500 MG tablet  . Multiple Vitamins-Minerals (HAIR/SKIN/NAILS) CAPS  . nitroGLYCERIN (NITROSTAT) 0.4 MG SL tablet  . oxybutynin (DITROPAN-XL) 5 MG 24 hr tablet  . polyethylene glycol (MIRALAX / GLYCOLAX) 17 g packet   . [START ON 03/30/2019] feeding supplement (ENSURE PRE-SURGERY) liquid 296 mL     Maia Plan [facility] Pre-Surgical  Testing [phone] 03/29/19  12:52 PM

## 2019-03-31 MED ORDER — DEXTROSE 5 % IV SOLN
3.0000 g | INTRAVENOUS | Status: AC
  Administered 2019-04-01: 3 g via INTRAVENOUS
  Filled 2019-03-31: qty 3

## 2019-04-01 ENCOUNTER — Ambulatory Visit (HOSPITAL_COMMUNITY): Payer: Federal, State, Local not specified - PPO | Admitting: Physician Assistant

## 2019-04-01 ENCOUNTER — Encounter (HOSPITAL_COMMUNITY)
Admission: RE | Disposition: A | Discharge status: Home / Self Care | Payer: Self-pay | Source: Ambulatory Visit | Attending: General Surgery

## 2019-04-01 ENCOUNTER — Ambulatory Visit (HOSPITAL_COMMUNITY)
Admission: RE | Admit: 2019-04-01 | Discharge: 2019-04-01 | Disposition: A | Discharge status: Home / Self Care | Payer: Federal, State, Local not specified - PPO | Source: Ambulatory Visit | Attending: General Surgery | Admitting: General Surgery

## 2019-04-01 ENCOUNTER — Ambulatory Visit (HOSPITAL_COMMUNITY): Payer: Federal, State, Local not specified - PPO | Admitting: Certified Registered Nurse Anesthetist

## 2019-04-01 ENCOUNTER — Encounter (HOSPITAL_COMMUNITY): Payer: Self-pay

## 2019-04-01 DIAGNOSIS — J45909 Unspecified asthma, uncomplicated: Secondary | ICD-10-CM | POA: Diagnosis not present

## 2019-04-01 DIAGNOSIS — Z6841 Body Mass Index (BMI) 40.0 and over, adult: Secondary | ICD-10-CM | POA: Diagnosis not present

## 2019-04-01 DIAGNOSIS — I081 Rheumatic disorders of both mitral and tricuspid valves: Secondary | ICD-10-CM | POA: Diagnosis not present

## 2019-04-01 DIAGNOSIS — Z794 Long term (current) use of insulin: Secondary | ICD-10-CM | POA: Insufficient documentation

## 2019-04-01 DIAGNOSIS — Z79899 Other long term (current) drug therapy: Secondary | ICD-10-CM | POA: Insufficient documentation

## 2019-04-01 DIAGNOSIS — Z7982 Long term (current) use of aspirin: Secondary | ICD-10-CM | POA: Diagnosis not present

## 2019-04-01 DIAGNOSIS — R221 Localized swelling, mass and lump, neck: Secondary | ICD-10-CM | POA: Diagnosis not present

## 2019-04-01 DIAGNOSIS — Z7984 Long term (current) use of oral hypoglycemic drugs: Secondary | ICD-10-CM | POA: Diagnosis not present

## 2019-04-01 DIAGNOSIS — D1721 Benign lipomatous neoplasm of skin and subcutaneous tissue of right arm: Secondary | ICD-10-CM | POA: Diagnosis not present

## 2019-04-01 DIAGNOSIS — E119 Type 2 diabetes mellitus without complications: Secondary | ICD-10-CM | POA: Insufficient documentation

## 2019-04-01 DIAGNOSIS — R2231 Localized swelling, mass and lump, right upper limb: Secondary | ICD-10-CM | POA: Diagnosis not present

## 2019-04-01 DIAGNOSIS — Z91040 Latex allergy status: Secondary | ICD-10-CM | POA: Insufficient documentation

## 2019-04-01 HISTORY — PX: MASS EXCISION: SHX2000

## 2019-04-01 LAB — GLUCOSE, CAPILLARY
Glucose-Capillary: 162 mg/dL — ABNORMAL HIGH (ref 70–99)
Glucose-Capillary: 163 mg/dL — ABNORMAL HIGH (ref 70–99)

## 2019-04-01 LAB — PREGNANCY, URINE: Preg Test, Ur: NEGATIVE

## 2019-04-01 SURGERY — EXCISION MASS
Anesthesia: General | Laterality: Right

## 2019-04-01 MED ORDER — MIDAZOLAM HCL 5 MG/5ML IJ SOLN
INTRAMUSCULAR | Status: DC | PRN
  Administered 2019-04-01: 2 mg via INTRAVENOUS

## 2019-04-01 MED ORDER — ONDANSETRON HCL 4 MG/2ML IJ SOLN
INTRAMUSCULAR | Status: DC | PRN
  Administered 2019-04-01: 4 mg via INTRAVENOUS

## 2019-04-01 MED ORDER — ONDANSETRON HCL 4 MG/2ML IJ SOLN
INTRAMUSCULAR | Status: AC
  Filled 2019-04-01: qty 2

## 2019-04-01 MED ORDER — CHLORHEXIDINE GLUCONATE CLOTH 2 % EX PADS: 6.0000 | MEDICATED_PAD | Freq: Once | CUTANEOUS | Status: DC

## 2019-04-01 MED ORDER — GABAPENTIN 300 MG PO CAPS: 300.0000 mg | ORAL_CAPSULE | ORAL | Status: DC

## 2019-04-01 MED ORDER — LIDOCAINE 2% (20 MG/ML) 5 ML SYRINGE
INTRAMUSCULAR | Status: DC | PRN
  Administered 2019-04-01: 80 mg via INTRAVENOUS

## 2019-04-01 MED ORDER — PROPOFOL 10 MG/ML IV BOLUS
INTRAVENOUS | Status: DC | PRN
  Administered 2019-04-01: 200 mg via INTRAVENOUS

## 2019-04-01 MED ORDER — KETOROLAC TROMETHAMINE 30 MG/ML IJ SOLN
INTRAMUSCULAR | Status: DC | PRN
  Administered 2019-04-01: 30 mg via INTRAVENOUS

## 2019-04-01 MED ORDER — OXYCODONE HCL 5 MG/5ML PO SOLN: 5.0000 mg | Freq: Once | ORAL | Status: DC | PRN

## 2019-04-01 MED ORDER — FENTANYL CITRATE (PF) 100 MCG/2ML IJ SOLN
INTRAMUSCULAR | Status: DC | PRN
  Administered 2019-04-01 (×2): 50 ug via INTRAVENOUS

## 2019-04-01 MED ORDER — PROPOFOL 10 MG/ML IV BOLUS
INTRAVENOUS | Status: AC
  Filled 2019-04-01: qty 20

## 2019-04-01 MED ORDER — KETOROLAC TROMETHAMINE 15 MG/ML IJ SOLN
15.0000 mg | INTRAMUSCULAR | Status: AC
  Filled 2019-04-01: qty 1

## 2019-04-01 MED ORDER — OXYCODONE HCL 5 MG PO TABS: 5.0000 mg | ORAL_TABLET | Freq: Once | ORAL | Status: DC | PRN

## 2019-04-01 MED ORDER — IBUPROFEN 800 MG PO TABS: 800.0000 mg | ORAL_TABLET | Freq: Three times a day (TID) | ORAL | 0 refills | Status: DC | PRN

## 2019-04-01 MED ORDER — LACTATED RINGERS IV SOLN
INTRAVENOUS | Status: DC
  Administered 2019-04-01 (×2): via INTRAVENOUS

## 2019-04-01 MED ORDER — FENTANYL CITRATE (PF) 100 MCG/2ML IJ SOLN
INTRAMUSCULAR | Status: AC
  Filled 2019-04-01: qty 2

## 2019-04-01 MED ORDER — MIDAZOLAM HCL 2 MG/2ML IJ SOLN
INTRAMUSCULAR | Status: AC
  Filled 2019-04-01: qty 2

## 2019-04-01 MED ORDER — LIDOCAINE 2% (20 MG/ML) 5 ML SYRINGE
INTRAMUSCULAR | Status: AC
  Filled 2019-04-01: qty 5

## 2019-04-01 MED ORDER — DEXAMETHASONE SODIUM PHOSPHATE 10 MG/ML IJ SOLN
INTRAMUSCULAR | Status: AC
  Filled 2019-04-01: qty 1

## 2019-04-01 MED ORDER — GABAPENTIN 300 MG PO CAPS
ORAL_CAPSULE | ORAL | Status: AC
  Filled 2019-04-01: qty 1

## 2019-04-01 MED ORDER — 0.9 % SODIUM CHLORIDE (POUR BTL) OPTIME
TOPICAL | Status: DC | PRN
  Administered 2019-04-01: 1000 mL

## 2019-04-01 MED ORDER — HYDROMORPHONE HCL 1 MG/ML IJ SOLN: 0.2500 mg | INTRAMUSCULAR | Status: DC | PRN

## 2019-04-01 MED ORDER — PROMETHAZINE HCL 25 MG/ML IJ SOLN: 6.2500 mg | INTRAMUSCULAR | Status: DC | PRN

## 2019-04-01 MED ORDER — ACETAMINOPHEN 500 MG PO TABS
1000.0000 mg | ORAL_TABLET | ORAL | Status: AC
  Administered 2019-04-01: 1000 mg via ORAL
  Filled 2019-04-01: qty 2

## 2019-04-01 MED ORDER — BUPIVACAINE-EPINEPHRINE 0.25% -1:200000 IJ SOLN
INTRAMUSCULAR | Status: DC | PRN
  Administered 2019-04-01: 20 mL

## 2019-04-01 MED ORDER — DEXAMETHASONE SODIUM PHOSPHATE 10 MG/ML IJ SOLN
INTRAMUSCULAR | Status: DC | PRN
  Administered 2019-04-01: 10 mg via INTRAVENOUS

## 2019-04-01 MED ORDER — MEPERIDINE HCL 50 MG/ML IJ SOLN: 6.2500 mg | INTRAMUSCULAR | Status: DC | PRN

## 2019-04-01 MED ORDER — KETOROLAC TROMETHAMINE 30 MG/ML IJ SOLN
INTRAMUSCULAR | Status: AC
  Administered 2019-04-01: 15 mg
  Filled 2019-04-01: qty 1

## 2019-04-01 MED ORDER — BUPIVACAINE-EPINEPHRINE 0.25% -1:200000 IJ SOLN
INTRAMUSCULAR | Status: AC
  Filled 2019-04-01: qty 1

## 2019-04-01 SURGICAL SUPPLY — 35 items
BENZOIN TINCTURE PRP APPL 2/3 (GAUZE/BANDAGES/DRESSINGS) IMPLANT
BLADE CLIPPER SURG (BLADE) ×1 IMPLANT
CHLORAPREP W/TINT 26 (MISCELLANEOUS) ×1 IMPLANT
COVER SURGICAL LIGHT HANDLE (MISCELLANEOUS) ×2 IMPLANT
COVER WAND RF STERILE (DRAPES) ×1 IMPLANT
DERMABOND ADVANCED (GAUZE/BANDAGES/DRESSINGS) ×1
DERMABOND ADVANCED .7 DNX12 (GAUZE/BANDAGES/DRESSINGS) IMPLANT
DRAPE LAPAROTOMY T 98X78 PEDS (DRAPES) ×2 IMPLANT
DRAPE UTILITY XL STRL (DRAPES) ×1 IMPLANT
DRSG TEGADERM 4X4.75 (GAUZE/BANDAGES/DRESSINGS) IMPLANT
ELECT PENCIL ROCKER SW 15FT (MISCELLANEOUS) ×1 IMPLANT
ELECT REM PT RETURN 15FT ADLT (MISCELLANEOUS) ×2 IMPLANT
GAUZE SPONGE 4X4 12PLY STRL (GAUZE/BANDAGES/DRESSINGS) IMPLANT
GLOVE BIOGEL PI IND STRL 7.5 (GLOVE) ×1 IMPLANT
GLOVE BIOGEL PI INDICATOR 7.5 (GLOVE) ×1
GLOVE SURG SS PI 7.0 STRL IVOR (GLOVE) ×2 IMPLANT
GOWN STRL REUS W/TWL LRG LVL3 (GOWN DISPOSABLE) ×4 IMPLANT
GOWN STRL REUS W/TWL XL LVL3 (GOWN DISPOSABLE) ×2 IMPLANT
KIT BASIN OR (CUSTOM PROCEDURE TRAY) ×2 IMPLANT
KIT TURNOVER KIT A (KITS) IMPLANT
NDL HYPO 25X1 1.5 SAFETY (NEEDLE) ×1 IMPLANT
NEEDLE HYPO 25X1 1.5 SAFETY (NEEDLE) ×2 IMPLANT
PACK BASIC VI WITH GOWN DISP (CUSTOM PROCEDURE TRAY) ×2 IMPLANT
SPONGE LAP 18X18 RF (DISPOSABLE) ×2 IMPLANT
STRIP CLOSURE SKIN 1/2X4 (GAUZE/BANDAGES/DRESSINGS) IMPLANT
SUT MNCRL AB 4-0 PS2 18 (SUTURE) ×2 IMPLANT
SUT SILK 3 0 (SUTURE) ×1
SUT SILK 3-0 18XBRD TIE 12 (SUTURE) IMPLANT
SUT VIC AB 2-0 SH 27 (SUTURE) ×1
SUT VIC AB 2-0 SH 27X BRD (SUTURE) ×1 IMPLANT
SUT VIC AB 3-0 SH 27 (SUTURE) ×1
SUT VIC AB 3-0 SH 27XBRD (SUTURE) ×1 IMPLANT
SYR BULB IRRIGATION 50ML (SYRINGE) ×1 IMPLANT
SYR CONTROL 10ML LL (SYRINGE) ×2 IMPLANT
TOWEL OR 17X26 10 PK STRL BLUE (TOWEL DISPOSABLE) ×2 IMPLANT

## 2019-04-01 NOTE — Anesthesia Postprocedure Evaluation (Signed)
Anesthesia Post Note  Patient: [name]  Procedure(s) Performed: EXCISION RIGHT ARM MASS (Right )     Patient location during evaluation: PACU Anesthesia Type: General Level of consciousness: awake and alert Pain management: pain level controlled Vital Signs Assessment: post-procedure vital signs reviewed and stable Respiratory status: spontaneous breathing, nonlabored ventilation and respiratory function stable Cardiovascular status: blood pressure returned to baseline and stable Postop Assessment: no apparent nausea or vomiting Anesthetic complications: no    Last Vitals:  Vitals:   04/01/19 0900 04/01/19 0915  BP: 133/71 123/84  Pulse: 60 (!) 58  Resp: 16 17  Temp:    SpO2: 100% 99%    Last Pain:  Vitals:   04/01/19 0915  TempSrc:   PainSc: 0-No pain                 Lynda Rainwater

## 2019-04-01 NOTE — Anesthesia Procedure Notes (Signed)
Procedure Name: LMA Insertion Date/Time: 04/01/2019 7:31 AM Performed by: [place] Shaylan Tutton C, CRNA Pre-anesthesia Checklist: Patient identified, Emergency Drugs available, Suction available and Patient being monitored Patient Re-evaluated:Patient Re-evaluated prior to induction Oxygen Delivery Method: Circle system utilized Preoxygenation: Pre-oxygenation with 100% oxygen Induction Type: IV induction Ventilation: Mask ventilation without difficulty LMA: LMA inserted LMA Size: 4.0 Number of attempts: 1 Airway Equipment and Method: Bite block Placement Confirmation: positive ETCO2 Tube secured with: Tape Dental Injury: Teeth and Oropharynx as per pre-operative assessment

## 2019-04-01 NOTE — Discharge Instructions (Signed)
General Anesthesia, Adult, Care After °This sheet gives you information about how to care for yourself after your procedure. Your health care provider may also give you more specific instructions. If you have problems or questions, contact your health care provider. °What can I expect after the procedure? °After the procedure, the following side effects are common: °· Pain or discomfort at the IV site. °· Nausea. °· Vomiting. °· Sore throat. °· Trouble concentrating. °· Feeling cold or chills. °· Weak or tired. °· Sleepiness and fatigue. °· Soreness and body aches. These side effects can affect parts of the body that were not involved in surgery. °Follow these instructions at home: ° °For at least 24 hours after the procedure: °· Have a responsible adult stay with you. It is important to have someone help care for you until you are awake and alert. °· Rest as needed. °· Do not: °? Participate in activities in which you could fall or become injured. °? Drive. °? Use heavy machinery. °? Drink alcohol. °? Take sleeping pills or medicines that cause drowsiness. °? Make important decisions or sign legal documents. °? Take care of children on your own. °Eating and drinking °· Follow any instructions from your health care provider about eating or drinking restrictions. °· When you feel hungry, start by eating small amounts of foods that are soft and easy to digest (bland), such as toast. Gradually return to your regular diet. °· Drink enough fluid to keep your urine pale yellow. °· If you vomit, rehydrate by drinking water, juice, or clear broth. °General instructions °· If you have sleep apnea, surgery and certain medicines can increase your risk for breathing problems. Follow instructions from your health care provider about wearing your sleep device: °? Anytime you are sleeping, including during daytime naps. °? While taking prescription pain medicines, sleeping medicines, or medicines that make you drowsy. °· Return to  your normal activities as told by your health care provider. Ask your health care provider what activities are safe for you. °· Take over-the-counter and prescription medicines only as told by your health care provider. °· If you smoke, do not smoke without supervision. °· Keep all follow-up visits as told by your health care provider. This is important. °Contact a health care provider if: °· You have nausea or vomiting that does not get better with medicine. °· You cannot eat or drink without vomiting. °· You have pain that does not get better with medicine. °· You are unable to pass urine. °· You develop a skin rash. °· You have a fever. °· You have redness around your IV site that gets worse. °Get help right away if: °· You have difficulty breathing. °· You have chest pain. °· You have blood in your urine or stool, or you vomit blood. °Summary °· After the procedure, it is common to have a sore throat or nausea. It is also common to feel tired. °· Have a responsible adult stay with you for the first 24 hours after general anesthesia. It is important to have someone help care for you until you are awake and alert. °· When you feel hungry, start by eating small amounts of foods that are soft and easy to digest (bland), such as toast. Gradually return to your regular diet. °· Drink enough fluid to keep your urine pale yellow. °· Return to your normal activities as told by your health care provider. Ask your health care provider what activities are safe for you. °This information is not   intended to replace advice given to you by your health care provider. Make sure you discuss any questions you have with your health care provider. Document Released: 08/01/2000 Document Revised: 04/28/2017 Document Reviewed: 12/09/2016 Elsevier Patient Education  [place].   Incision and Drainage, Care After This sheet gives you information about how to care for yourself after your procedure. Your health care provider  may also give you more specific instructions. If you have problems or questions, contact your health care provider. What can I expect after the procedure? After the procedure, it is common to have:  Pain or discomfort around the incision site.  Blood, fluid, or pus (drainage) from the incision.  Redness and firm skin around the incision site. Follow these instructions at home: Medicines  Take over-the-counter and prescription medicines only as told by your health care provider.  If you were prescribed an antibiotic medicine, use or take it as told by your health care provider. Do not stop using the antibiotic even if you start to feel better. Wound care Follow instructions from your health care provider about how to take care of your wound. Make sure you:  Wash your hands with soap and water before and after you change your bandage (dressing). If soap and water are not available, use hand sanitizer.  Change your dressing and packing as told by your health care provider. ? If your dressing is dry or stuck when you try to remove it, moisten or wet the dressing with saline or water so that it can be removed without harming your skin or tissues. ? If your wound is packed, leave it in place until your health care provider tells you to remove it. To remove the packing, moisten or wet the packing with saline or water so that it can be removed without harming your skin or tissues.  Leave stitches (sutures), skin glue. These skin closures may need to stay in place for 2 weeks or longer. If adhesive strip edges start to loosen and curl up, you may trim the loose edges. Glue will fall off on its own.   Check your wound every day for signs of infection. Check for:  More redness, swelling, or pain.  More fluid or blood.  Warmth.  Pus or a bad smell. If you were sent home with a drain tube in place, follow instructions from your health care provider about:  How to empty it.  How to care for  it at home.  General instructions  Rest the affected area.  Do not take baths, swim, or use a hot tub until your health care provider approves. Ask your health care provider if you may take showers. You may only be allowed to take sponge baths.  Return to your normal activities as told by your health care provider. Ask your health care provider what activities are safe for you. Your health care provider may put you on activity or lifting restrictions.  The incision will continue to drain. It is normal to have some clear or slightly bloody drainage. The amount of drainage should lessen each day.  Do not apply any creams, ointments, or liquids unless you have been told to by your health care provider.  Keep all follow-up visits as told by your health care provider. This is important. Contact a health care provider if:  Your cyst or abscess returns.  You have a fever or chills.  You have more redness, swelling, or pain around your incision.  You have more  fluid or blood coming from your incision.  Your incision feels warm to the touch.  You have pus or a bad smell coming from your incision.  You have red streaks above or below the incision site. Get help right away if:  You have severe pain or bleeding.  You cannot eat or drink without vomiting.  You have decreased urine output.  You become short of breath.  You have chest pain.  You cough up blood.  The affected area becomes numb or starts to tingle. These symptoms may represent a serious problem that is an emergency. Do not wait to see if the symptoms will go away. Get medical help right away. Call your local emergency services (911 in the [place] Do not drive yourself to the hospital. Summary  After this procedure, it is common to have fluid, blood, or pus coming from the surgery site.  Follow all home care instructions. You will be told how to take care of your incision, how to check for infection, and how to take  medicines.  If you were prescribed an antibiotic medicine, take it as told by your health care provider. Do not stop taking the antibiotic even if you start to feel better.  Contact a health care provider if you have increased redness, swelling, or pain around your incision. Get help right away if you have chest pain, you vomit, you cough up blood, or you have shortness of breath.  Keep all follow-up visits as told by your health care provider. This is important. This information is not intended to replace advice given to you by your health care provider. Make sure you discuss any questions you have with your health care provider. Document Released: 07/18/2011 Document Revised: 03/26/2018 Document Reviewed: 03/26/2018 Elsevier Patient Education  2020 [employer].

## 2019-04-01 NOTE — Anesthesia Preprocedure Evaluation (Signed)
Anesthesia Evaluation  Patient identified by MRN, date of birth, ID band Patient awake    Reviewed: Allergy & Precautions, NPO status , Patient's Chart, lab work & pertinent test results  History of Anesthesia Complications (+) PONVNegative for: history of anesthetic complications  Airway Mallampati: II  TM Distance: >3 FB Neck ROM: Full    Dental no notable dental hx.    Pulmonary asthma ,    Pulmonary exam normal breath sounds clear to auscultation       Cardiovascular negative cardio ROS Normal cardiovascular exam Rhythm:Regular Rate:Normal     Neuro/Psych negative neurological ROS  negative psych ROS   GI/Hepatic negative GI ROS, Neg liver ROS,   Endo/Other  diabetes, Type 2, Oral Hypoglycemic AgentsMorbid obesity  Renal/GU negative Renal ROS  negative genitourinary   Musculoskeletal negative musculoskeletal ROS (+)   Abdominal (+) + obese,   Peds negative pediatric ROS (+)  Hematology negative hematology ROS (+)   Anesthesia Other Findings   Reproductive/Obstetrics negative OB ROS                             Anesthesia Physical  Anesthesia Plan  ASA: III  Anesthesia Plan: General   Post-op Pain Management:    Induction: Intravenous  PONV Risk Score and Plan: 4 or greater and Ondansetron, Dexamethasone, Midazolam, Scopolamine patch - Pre-op and Treatment may vary due to age or medical condition  Airway Management Planned: LMA  Additional Equipment:   Intra-op Plan: Utilization of Controlled Hypotension per surrgeon request  Post-operative Plan: Extubation in OR  Informed Consent: I have reviewed the patients History and Physical, chart, labs and discussed the procedure including the risks, benefits and alternatives for the proposed anesthesia with the patient or authorized representative who has indicated his/her understanding and acceptance.     Dental advisory  given  Plan Discussed with: CRNA and Surgeon  Anesthesia Plan Comments:         Anesthesia Quick Evaluation

## 2019-04-01 NOTE — H&P (Signed)
[name] is an 38 y.o. female.   Chief Complaint: arm mass HPI: 38 yo female with enlarging mass of right antecubital fossa. No history of other masses, no history of trauma or skin infections.  Past Medical History:  Diagnosis Date  . Back pain   . Carpal tunnel syndrome    bilateral  . Complication of anesthesia    "hard to wake up"  . Diabetes mellitus without complication ([place])   . Dyspnea    with excersion  . Family history of adverse reaction to anesthesia    mom voniting  . Obesity   . PONV (postoperative nausea and vomiting)   . Tricuspid valve regurgitation    also mitral regurg- trace on both per echo    Past Surgical History:  Procedure Laterality Date  . CARDIAC CATHETERIZATION N/A 11/24/2015   Procedure: Left Heart Cath and Coronary Angiography;  Surgeon: Adrian Prows, MD;  Location: [facility];  Service: Cardiovascular;  Laterality: N/A;  . CESAREAN SECTION    . DILATION AND CURETTAGE OF UTERUS     x4  . LUMBAR LAMINECTOMY/DECOMPRESSION MICRODISCECTOMY N/A 12/14/2016   Procedure: LUMBAR DECOMPRESSION MICRODISCECTOMY L5-S1;  Surgeon: Melina Schools, MD;  Location: [facility];  Service: Orthopedics;  Laterality: N/A;  120 mins    Family History  Problem Relation Age of Onset  . Cancer Father   . Diabetes Father   . Heart disease Father   . Hypertension Father   . Cancer Maternal Grandmother   . Diabetes Maternal Grandfather   . Cancer Paternal Grandmother   . Diabetes Paternal Grandfather    Social History:  reports that she has never smoked. She has never used smokeless tobacco. She reports that she does not drink alcohol or use drugs.  Allergies:  Allergies  Allergen Reactions  . Latex Rash    Medications Prior to Admission  Medication Sig Dispense Refill  . aspirin 81 MG tablet Take 81 mg by mouth at bedtime.     Marland Kitchen atorvastatin (LIPITOR) 10 MG tablet Take 10 mg by mouth every evening.     . Biotin 10 MG CAPS Take 20 mg by mouth every evening.     . Cholecalciferol (DIALYVITE VITAMIN D 5000) 125 MCG (5000 UT) capsule Take 5,000 Units by mouth every evening.    . Cyanocobalamin (B-12 PO) Take 1 capsule by mouth every evening.    . cyclobenzaprine (FLEXERIL) 10 MG tablet Take 10 mg by mouth daily as needed for muscle spasms.     . diphenhydrAMINE (BENADRYL) 25 MG tablet Take 50 mg by mouth daily as needed for allergies.    . Dulaglutide (TRULICITY) 1.5 0000000 SOPN Inject 1.5 mg into the skin every Sunday.    . fluconazole (DIFLUCAN) 200 MG tablet Take 200 mg by mouth every other day. In the evening  6  . fluticasone (FLONASE) 50 MCG/ACT nasal spray Place 2 sprays into both nostrils daily as needed for allergies.   3  . gabapentin (NEURONTIN) 300 MG capsule Take 1 capsule (300 mg total) by mouth 3 (three) times daily. 90 capsule 0  . HYDROcodone-acetaminophen (NORCO/VICODIN) 5-325 MG tablet Take 1 tablet by mouth daily as needed (back pain).    . Ibuprofen-Famotidine (DUEXIS) 800-26.6 MG TABS Take 1 tablet by mouth 3 (three) times daily.    . Insulin Glargine (BASAGLAR KWIKPEN) 100 UNIT/ML SOPN Inject 100 Units into the skin daily with lunch.     . linaclotide (LINZESS) 145 MCG CAPS capsule Take 1  capsule (145 mcg total) by mouth daily as needed. (Patient taking differently: Take 145 mcg by mouth daily. ) 30 capsule 11  . losartan (COZAAR) 25 MG tablet Take 25 mg by mouth every evening.    . Melatonin 3 MG CAPS Take 3 mg by mouth at bedtime.    . metFORMIN (GLUCOPHAGE-XR) 500 MG 24 hr tablet Take 2,000 mg by mouth daily with supper.    . Multiple Vitamins-Minerals (HAIR/SKIN/NAILS) CAPS Take 3 capsules by mouth every evening.    Marland Kitchen oxybutynin (DITROPAN-XL) 5 MG 24 hr tablet Take 5 mg by mouth daily.    . polyethylene glycol (MIRALAX / GLYCOLAX) 17 g packet Take 17 g by mouth daily.    Marland Kitchen albuterol (PROVENTIL HFA;VENTOLIN HFA) 108 (90 Base) MCG/ACT inhaler Inhale 2 puffs into the lungs every 6 (six) hours as needed for wheezing or shortness of  breath.     . methocarbamol (ROBAXIN) 500 MG tablet Take 1 tablet (500 mg total) by mouth 3 (three) times daily. (Patient taking differently: Take 500 mg by mouth daily as needed (severe muscle spams). ) 30 tablet 0  . nitroGLYCERIN (NITROSTAT) 0.4 MG SL tablet Place 0.4 mg under the tongue every 5 (five) minutes as needed for chest pain.      Results for orders placed or performed during the hospital encounter of 04/01/19 (from the past 48 hour(s))  Pregnancy, urine     Status: None   Collection Time: 04/01/19  5:43 AM  Result Value Ref Range   Preg Test, Ur NEGATIVE NEGATIVE    Comment:        THE SENSITIVITY OF THIS METHODOLOGY IS >20 mIU/mL. Performed at [facility], [place] [address], [place],  [address]   Glucose, capillary     Status: Abnormal   Collection Time: 04/01/19  6:01 AM  Result Value Ref Range   Glucose-Capillary 162 (H) 70 - 99 mg/dL   No results found.  Review of Systems  Constitutional: Negative for chills and fever.  HENT: Negative for hearing loss.   Eyes: Negative for blurred vision and double vision.  Respiratory: Negative for cough and hemoptysis.   Cardiovascular: Negative for chest pain and palpitations.  Gastrointestinal: Negative for abdominal pain, nausea and vomiting.  Genitourinary: Negative for dysuria and urgency.  Musculoskeletal: Negative for myalgias and neck pain.  Skin: Negative for itching and rash.  Neurological: Negative for dizziness, tingling and headaches.  Endo/Heme/Allergies: Does not bruise/bleed easily.  Psychiatric/Behavioral: Negative for depression and suicidal ideas.   Blood pressure (!) 151/82, pulse 76, temperature 98.4 F (36.9 C), temperature source Oral, resp. rate 16, height 5\' 7"  (1.702 m), weight 123.9 kg, last menstrual period 03/27/2019, SpO2 99 %. Physical Exam  Vitals reviewed. Constitutional: She is oriented to person, place, and time. She appears well-developed and well-nourished.   HENT:  Head: Normocephalic and atraumatic.  Eyes: Pupils are equal, round, and reactive to light. Conjunctivae and EOM are normal.  Neck: Normal range of motion. Neck supple.  Cardiovascular: Normal rate and regular rhythm.  Respiratory: Effort normal and breath sounds normal.  GI: Soft. Bowel sounds are normal. She exhibits no distension. There is no abdominal tenderness.  Musculoskeletal: Normal range of motion.  Neurological: She is alert and oriented to person, place, and time.  Skin: Skin is warm and dry.  Antecubital fossa mass, no erythema or opening  Psychiatric: She has a normal mood and affect. Her behavior is normal.    Assessment/Plan 38 yo female with  enlarging right forearm mass -excision of forearm mass -planned outpatient procedure  Mickeal Skinner, MD 04/01/2019, 7:10 AM

## 2019-04-01 NOTE — Op Note (Signed)
Preoperative diagnosis: right antecubital fossa mass  Postoperative diagnosis: same   Procedure: excision of 4 cm right antecubital fossa fatty mass  Surgeon: Gurney Maxin, M.D.  Asst: none  Anesthesia: general  Indications for procedure: [name] is a 37 y.o. year old female with symptoms of enlarging mass of the right arm.  Description of procedure: The patient was brought into the operative suite. Anesthesia was administered with General LMA anesthesia. WHO checklist was applied. The patient was then placed in supine position. The area was prepped and draped in the usual sterile fashion.  Next, marcaine was infused over the area. A transverse incision was made through the skin. Cautery was used to dissect through the subcutaneous tissue. Blunt dissection and cautery was used to dissect the mass free of surrounding attachments. The mass was deep to the confluence of vein in the antecubital fossa. It was able to be manipulated around the veins without injury or ligation. The mass was removed in its entirety. The wound was irrigated. The incision was closed with 3-0 vicryl in interrupted fashion and the skin was closed with 4-0 monocryl in running subcuticular stitch. Dermabond was put in place for dressing. The patient awoke from anesthesia and brought to pacu in stable condition. All counts were correct. The patient tolerated the procedure well.   Findings: 4 cm fatty mass  Specimen: forearm mass  Implant: none   Blood loss: 5 ml  Local anesthesia: 20 ml marcaine   Complications: none  Gurney Maxin, M.D. General, Bariatric, & Minimally Invasive Surgery [facility]

## 2019-04-01 NOTE — Transfer of Care (Signed)
Immediate Anesthesia Transfer of Care Note  Patient: [name]  Procedure(s) Performed: EXCISION RIGHT ARM MASS (Right )  Patient Location: PACU  Anesthesia Type:General  Level of Consciousness: awake, alert  and oriented  Airway & Oxygen Therapy: Patient Spontanous Breathing and Patient connected to face mask oxygen  Post-op Assessment: Report given to RN and Post -op Vital signs reviewed and stable  Post vital signs: Reviewed  Last Vitals:  Vitals Value Taken Time  BP 105/95 04/01/19 0822  Temp    Pulse 69 04/01/19 0825  Resp 16 04/01/19 0825  SpO2 100 % 04/01/19 0825  Vitals shown include unvalidated device data.  Last Pain:  Vitals:   04/01/19 0627  TempSrc:   PainSc: 0-No pain         Complications: No apparent anesthesia complications

## 2019-04-02 LAB — SURGICAL PATHOLOGY

## 2019-04-03 ENCOUNTER — Encounter (HOSPITAL_COMMUNITY): Payer: Self-pay | Admitting: General Surgery

## 2019-04-11 DIAGNOSIS — R030 Elevated blood-pressure reading, without diagnosis of hypertension: Secondary | ICD-10-CM | POA: Diagnosis not present

## 2019-04-11 DIAGNOSIS — K219 Gastro-esophageal reflux disease without esophagitis: Secondary | ICD-10-CM | POA: Diagnosis not present

## 2019-04-11 DIAGNOSIS — M545 Low back pain: Secondary | ICD-10-CM | POA: Diagnosis not present

## 2019-04-11 DIAGNOSIS — E782 Mixed hyperlipidemia: Secondary | ICD-10-CM | POA: Diagnosis not present

## 2019-04-12 DIAGNOSIS — M722 Plantar fascial fibromatosis: Secondary | ICD-10-CM | POA: Diagnosis not present

## 2019-04-12 DIAGNOSIS — G5761 Lesion of plantar nerve, right lower limb: Secondary | ICD-10-CM | POA: Diagnosis not present

## 2019-04-19 DIAGNOSIS — Z20828 Contact with and (suspected) exposure to other viral communicable diseases: Secondary | ICD-10-CM | POA: Diagnosis not present

## 2019-04-26 DIAGNOSIS — M7751 Other enthesopathy of right foot: Secondary | ICD-10-CM | POA: Diagnosis not present

## 2019-04-26 DIAGNOSIS — G5761 Lesion of plantar nerve, right lower limb: Secondary | ICD-10-CM | POA: Diagnosis not present

## 2019-05-13 DIAGNOSIS — M792 Neuralgia and neuritis, unspecified: Secondary | ICD-10-CM | POA: Diagnosis not present

## 2019-05-22 DIAGNOSIS — E538 Deficiency of other specified B group vitamins: Secondary | ICD-10-CM | POA: Diagnosis not present

## 2019-05-22 DIAGNOSIS — Z5181 Encounter for therapeutic drug level monitoring: Secondary | ICD-10-CM | POA: Diagnosis not present

## 2019-05-22 DIAGNOSIS — E1165 Type 2 diabetes mellitus with hyperglycemia: Secondary | ICD-10-CM | POA: Diagnosis not present

## 2019-05-22 DIAGNOSIS — Z794 Long term (current) use of insulin: Secondary | ICD-10-CM | POA: Diagnosis not present

## 2019-06-03 DIAGNOSIS — G5761 Lesion of plantar nerve, right lower limb: Secondary | ICD-10-CM | POA: Diagnosis not present

## 2019-06-03 DIAGNOSIS — M7751 Other enthesopathy of right foot: Secondary | ICD-10-CM | POA: Diagnosis not present

## 2019-07-02 DIAGNOSIS — Z79899 Other long term (current) drug therapy: Secondary | ICD-10-CM | POA: Diagnosis not present

## 2019-07-02 DIAGNOSIS — E1165 Type 2 diabetes mellitus with hyperglycemia: Secondary | ICD-10-CM | POA: Diagnosis not present

## 2019-07-02 DIAGNOSIS — Z794 Long term (current) use of insulin: Secondary | ICD-10-CM | POA: Diagnosis not present

## 2019-08-08 DIAGNOSIS — E1165 Type 2 diabetes mellitus with hyperglycemia: Secondary | ICD-10-CM | POA: Diagnosis not present

## 2019-08-08 DIAGNOSIS — E782 Mixed hyperlipidemia: Secondary | ICD-10-CM | POA: Diagnosis not present

## 2019-08-20 DIAGNOSIS — B349 Viral infection, unspecified: Secondary | ICD-10-CM | POA: Diagnosis not present

## 2019-08-21 DIAGNOSIS — B349 Viral infection, unspecified: Secondary | ICD-10-CM | POA: Diagnosis not present

## 2019-08-27 ENCOUNTER — Ambulatory Visit
Admission: RE | Admit: 2019-08-27 | Discharge: 2019-08-27 | Disposition: A | Discharge status: Home / Self Care | Payer: Federal, State, Local not specified - PPO | Source: Ambulatory Visit | Attending: Family Medicine | Admitting: Family Medicine

## 2019-08-27 ENCOUNTER — Other Ambulatory Visit: Payer: Self-pay | Admitting: Family Medicine

## 2019-08-27 DIAGNOSIS — R059 Cough, unspecified: Secondary | ICD-10-CM

## 2019-08-27 DIAGNOSIS — R05 Cough: Secondary | ICD-10-CM

## 2019-09-11 DIAGNOSIS — M722 Plantar fascial fibromatosis: Secondary | ICD-10-CM | POA: Diagnosis not present

## 2019-09-11 DIAGNOSIS — G5761 Lesion of plantar nerve, right lower limb: Secondary | ICD-10-CM | POA: Diagnosis not present

## 2019-09-27 ENCOUNTER — Ambulatory Visit (INDEPENDENT_AMBULATORY_CARE_PROVIDER_SITE_OTHER): Payer: Federal, State, Local not specified - PPO | Admitting: Pulmonary Disease

## 2019-09-27 ENCOUNTER — Encounter: Payer: Self-pay | Admitting: Pulmonary Disease

## 2019-09-27 ENCOUNTER — Other Ambulatory Visit: Payer: Self-pay

## 2019-09-27 VITALS — BP 128/74 | HR 86 | Temp 97.2°F | Ht 68.0 in | Wt 263.2 lb

## 2019-09-27 DIAGNOSIS — R05 Cough: Secondary | ICD-10-CM

## 2019-09-27 DIAGNOSIS — H9201 Otalgia, right ear: Secondary | ICD-10-CM

## 2019-09-27 DIAGNOSIS — J029 Acute pharyngitis, unspecified: Secondary | ICD-10-CM | POA: Diagnosis not present

## 2019-09-27 DIAGNOSIS — R059 Cough, unspecified: Secondary | ICD-10-CM | POA: Insufficient documentation

## 2019-09-27 MED ORDER — BENZONATATE 200 MG PO CAPS: 200.0000 mg | ORAL_CAPSULE | Freq: Three times a day (TID) | ORAL | 1 refills | Status: DC | PRN

## 2019-09-27 MED ORDER — PREDNISONE 10 MG PO TABS: ORAL_TABLET | ORAL | 0 refills | Status: DC

## 2019-09-27 MED ORDER — AMOXICILLIN-POT CLAVULANATE 875-125 MG PO TABS: 1.0000 | ORAL_TABLET | Freq: Two times a day (BID) | ORAL | 0 refills | Status: DC

## 2019-09-27 NOTE — Assessment & Plan Note (Signed)
Worsened right ear pain Physical exam shows effusion, without infection  Plan: Start daily antihistamine Prednisone taper today Start Flonase Start nasal saline rinses Augmentin course Close follow-up with our office

## 2019-09-27 NOTE — Assessment & Plan Note (Signed)
2019 high-resolution CT chest shows air trapping Pulmonary function testing did not show formal obstruction in 2019 Recurrent cough episodes since April/08/2019 Dry, nonproductive Cough so significant that can cause incontinence as well as emesis Cough seems to be responsive to [employer] Status post 1 Z-Pak treatment Negative Covid test in April/2021 Clear chest x-ray in April/2021  Plan: We will treat course is a complicated version of bronchitis Augmentin today Prednisone taper today Start daily antihistamine which patient can take at night Continue Flonase Start nasal saline rinses Close follow-up with our office to ensure the symptoms are improving

## 2019-09-27 NOTE — Patient Instructions (Addendum)
You were seen today by Lauraine Rinne, NP  for:   1. Cough  - predniSONE (DELTASONE) 10 MG tablet; 4 tabs for 2 days, then 3 tabs for 2 days, 2 tabs for 2 days, then 1 tab for 2 days, then stop  Dispense: 20 tablet; Refill: 0 - amoxicillin-clavulanate (AUGMENTIN) 875-125 MG tablet; Take 1 tablet by mouth 2 (two) times daily.  Dispense: 14 tablet; Refill: 0 - benzonatate (TESSALON) 200 MG capsule; Take 1 capsule (200 mg total) by mouth 3 (three) times daily as needed for cough.  Dispense: 30 capsule; Refill: 1  Start Delsym cough medicine for management of cough  Start taking daily allergy pill such as Zyrtec at night  Start nasal saline rinses twice daily Use distilled water Shake well Get bottle lukewarm like a baby bottle  Use Flonase after nasal saline rinses   We recommend today:   Meds ordered this encounter  Medications  . predniSONE (DELTASONE) 10 MG tablet    Sig: 4 tabs for 2 days, then 3 tabs for 2 days, 2 tabs for 2 days, then 1 tab for 2 days, then stop    Dispense:  20 tablet    Refill:  0  . amoxicillin-clavulanate (AUGMENTIN) 875-125 MG tablet    Sig: Take 1 tablet by mouth 2 (two) times daily.    Dispense:  14 tablet    Refill:  0  . benzonatate (TESSALON) 200 MG capsule    Sig: Take 1 capsule (200 mg total) by mouth 3 (three) times daily as needed for cough.    Dispense:  30 capsule    Refill:  1    Follow Up:    Return in about 4 weeks (around 10/25/2019), or if symptoms worsen or fail to improve, for Follow up with Dr. Purnell Shoemaker.   Please do your part to reduce the spread of COVID-19:      Reduce your risk of any infection  and COVID19 by using the similar precautions used for avoiding the common cold or flu:  Marland Kitchen Wash your hands often with soap and warm water for at least 20 seconds.  If soap and water are not readily available, use an alcohol-based hand sanitizer with at least 60% alcohol.  . If coughing or sneezing, cover your mouth and nose by  coughing or sneezing into the elbow areas of your shirt or coat, into a tissue or into your sleeve (not your hands). Langley Gauss A MASK when in public  . Avoid shaking hands with others and consider head nods or verbal greetings only. . Avoid touching your eyes, nose, or mouth with unwashed hands.  . Avoid close contact with people who are sick. . Avoid places or events with large numbers of people in one location, like concerts or sporting events. . If you have some symptoms but not all symptoms, continue to monitor at home and seek medical attention if your symptoms worsen. . If you are having a medical emergency, call 911.   [place] / e-Visit: [email or web address]         [facility]: [facility]: [id number]                   [facility]: [id number]     It is flu season:   >>> Best ways to protect herself from the flu: Receive the yearly flu vaccine, practice good hand hygiene washing with soap  and also using hand sanitizer when available, eat a nutritious meals, get adequate rest, hydrate appropriately   Please contact the office if your symptoms worsen or you have concerns that you are not improving.   Thank you for choosing [facility] for your healthcare, and for allowing [place] to partner with you on your healthcare journey. I am thankful to be able to provide care to you today.   Wyn Quaker FNP-C

## 2019-09-27 NOTE — Progress Notes (Signed)
@Patient  ID: [name], female    DOB: Sep 14, 1980, 39 y.o.   MRN: [id number]  Chief Complaint  Patient presents with  . Follow-up    Cough    Referring provider: Antony Contras, MD  HPI:  39 year old female never smoker followed in our office for dyspnea on exertion  PMH: Atypical chest pain, status post lumbar disc herniation, obesity Smoker/ Smoking History: Never smoker Maintenance: None Pt of: Dr. Chase Caller  09/27/2019  - Visit   39 year old female never smoker followed by Dr. Chase Caller.  She was last seen in 2019.  At that time she was evaluated for dyspnea on exertion.  It was felt that it was related to weight as well as physical deconditioning status post back surgery.  She was encouraged to follow-up in 1 year.  Patient is reporting to our office today with acute worsening symptoms of cough.  Patient reporting that on 08/11/2019 she started to have a cough.  Is mainly a dry cough.  She is coughing spells so significant that induces vomiting.  Is also causing incontinence of bowels as well as urine.  Patient has had follow-up with primary care regarding this.  She was treated with a Z-Pak.  She was also tested for Covid and found to be negative on 08/16/2019 this is seen in the chart.  Patient had a chest x-ray in late April/2021 that was clear.  Patient has tried promethazine cough syrup which she had on hand which did allow her to sleep but did not suppress the cough.  She finds the [employer] have been the most helpful.  She needs a refill this today.  She also reports that she has had increased ear pain as well as wheezing over the last 2 weeks.  She is not currently taking a daily antihistamine.  She is currently taking Flonase.  She has nasal saline rinses but she has not been using.  Patient's spouse also has a cough.  She cannot think of any other sick contacts.  No recent medication changes.  Patient completed a virtual visit with her primary care provider today.  She  reports that they sent in a prescription of amoxicillin.  She has not picked that up yet.  Questionaires / Pulmonary Flowsheets:   MMRC: mMRC Dyspnea Scale mMRC Score  09/27/2019 2    Tests:   08/27/2019-chest x-ray-no active cardiopulmonary disease  09/08/2017-CT chest high-res-no evidence of ILD, mild air trapping indicative of small airways disease, hepatic stenosis  10/31/2017-pulmonary function test-FVC 3.05 (72% predicted), postbronchodilator ratio 82, postbronchodilator FEV1 2.51 (72% predicted), no bronchodilator response, DLCO 28.40 (95% predicted) ERV low-0.74 (42% retention)  FENO:  Lab Results  Component Value Date   NITRICOXIDE <5 08/31/2017    PFT: PFT Results Latest Ref Rng & Units 10/31/2017 09/26/2014  FVC-Pre L 3.05 2.91  FVC-Predicted Pre % 72 68  FVC-Post L 3.06 3.03  FVC-Predicted Post % 72 71  Pre FEV1/FVC % % 82 83  Post FEV1/FCV % % 82 82  FEV1-Pre L 2.49 2.41  FEV1-Predicted Pre % 72 68  FEV1-Post L 2.51 2.49  DLCO UNC% % 95 103  DLCO COR %Predicted % 125 141  TLC L 4.53 4.47  TLC % Predicted % 80 79  RV % Predicted % 82 91    WALK:  SIX MIN WALK 08/31/2017  Tech Comments: Walked at a normal pace and tolerated well. Denies SOB.     Imaging: No results found.  Lab Results:  CBC  Component Value Date/Time   WBC 8.6 03/29/2019 0905   RBC 4.72 03/29/2019 0905   HGB 14.1 03/29/2019 0905   HCT 42.5 03/29/2019 0905   PLT 320 03/29/2019 0905   MCV 90.0 03/29/2019 0905   MCV 87.3 12/07/2016 1427   MCH 29.9 03/29/2019 0905   MCHC 33.2 03/29/2019 0905   RDW 12.4 03/29/2019 0905   LYMPHSABS 1.1 04/19/2010 1309   MONOABS 0.2 04/19/2010 1309   EOSABS 0.0 04/19/2010 1309   BASOSABS 0.0 04/19/2010 1309    BMET    Component Value Date/Time   NA 136 03/29/2019 0905   K 4.0 03/29/2019 0905   CL 99 03/29/2019 0905   CO2 26 03/29/2019 0905   GLUCOSE 330 (H) 03/29/2019 0905   BUN 7 03/29/2019 0905   CREATININE 0.54 03/29/2019 0905   CALCIUM  8.7 (L) 03/29/2019 0905   GFRNONAA >60 03/29/2019 0905   GFRAA >60 03/29/2019 0905    BNP No results found for: BNP  ProBNP No results found for: PROBNP  Specialty Problems      Pulmonary Problems   Cough      Allergies  Allergen Reactions  . Latex Rash    Immunization History  Administered Date(s) Administered  . Influenza Split 02/03/2014  . Influenza,inj,Quad PF,6-35 Mos 03/10/2019  . Influenza-Unspecified 02/18/2013  . Pneumococcal Polysaccharide-23 11/27/2012    Past Medical History:  Diagnosis Date  . Back pain   . Carpal tunnel syndrome    bilateral  . Complication of anesthesia    "hard to wake up"  . Diabetes mellitus without complication ([place])   . Dyspnea    with excersion  . Family history of adverse reaction to anesthesia    mom voniting  . Obesity   . PONV (postoperative nausea and vomiting)   . Tricuspid valve regurgitation    also mitral regurg- trace on both per echo    Tobacco History: Social History   Tobacco Use  Smoking Status Never Smoker  Smokeless Tobacco Never Used   Counseling given: Yes   Continue to not smoke  Outpatient Encounter Medications as of 09/27/2019  Medication Sig  . albuterol (PROVENTIL HFA;VENTOLIN HFA) 108 (90 Base) MCG/ACT inhaler Inhale 2 puffs into the lungs every 6 (six) hours as needed for wheezing or shortness of breath.   Marland Kitchen aspirin 81 MG tablet Take 81 mg by mouth at bedtime.   Marland Kitchen atorvastatin (LIPITOR) 10 MG tablet Take 10 mg by mouth every evening.   . Biotin 10 MG CAPS Take 20 mg by mouth every evening.  . Cholecalciferol (DIALYVITE VITAMIN D 5000) 125 MCG (5000 UT) capsule Take 5,000 Units by mouth every evening.  . Cyanocobalamin (B-12 PO) Take 1 capsule by mouth every evening.  . cyclobenzaprine (FLEXERIL) 10 MG tablet Take 10 mg by mouth daily as needed for muscle spasms.   . diphenhydrAMINE (BENADRYL) 25 MG tablet Take 50 mg by mouth daily as needed for allergies.  . Dulaglutide (TRULICITY)  1.5 0000000 SOPN Inject 1.5 mg into the skin every Sunday.  . fluconazole (DIFLUCAN) 200 MG tablet Take 200 mg by mouth every other day. In the evening  . fluticasone (FLONASE) 50 MCG/ACT nasal spray Place 2 sprays into both nostrils daily as needed for allergies.   Marland Kitchen gabapentin (NEURONTIN) 300 MG capsule Take 1 capsule (300 mg total) by mouth 3 (three) times daily.  Marland Kitchen HYDROcodone-acetaminophen (NORCO/VICODIN) 5-325 MG tablet Take 1 tablet by mouth daily as needed (back pain).  Marland Kitchen ibuprofen (ADVIL) 800  MG tablet Take 1 tablet (800 mg total) by mouth every 8 (eight) hours as needed.  . Ibuprofen-Famotidine (DUEXIS) 800-26.6 MG TABS Take 1 tablet by mouth 3 (three) times daily.  . Insulin Glargine (BASAGLAR KWIKPEN) 100 UNIT/ML SOPN Inject 100 Units into the skin daily with lunch.   . linaclotide (LINZESS) 145 MCG CAPS capsule Take 1 capsule (145 mcg total) by mouth daily as needed. (Patient taking differently: Take 145 mcg by mouth daily. )  . losartan (COZAAR) 25 MG tablet Take 25 mg by mouth every evening.  . Melatonin 3 MG CAPS Take 3 mg by mouth at bedtime.  . metFORMIN (GLUCOPHAGE-XR) 500 MG 24 hr tablet Take 2,000 mg by mouth daily with supper.  . methocarbamol (ROBAXIN) 500 MG tablet Take 1 tablet (500 mg total) by mouth 3 (three) times daily. (Patient taking differently: Take 500 mg by mouth daily as needed (severe muscle spams). )  . Multiple Vitamins-Minerals (HAIR/SKIN/NAILS) CAPS Take 3 capsules by mouth every evening.  . nitroGLYCERIN (NITROSTAT) 0.4 MG SL tablet Place 0.4 mg under the tongue every 5 (five) minutes as needed for chest pain.  Marland Kitchen oxybutynin (DITROPAN-XL) 5 MG 24 hr tablet Take 5 mg by mouth daily.  . polyethylene glycol (MIRALAX / GLYCOLAX) 17 g packet Take 17 g by mouth daily.  Marland Kitchen amoxicillin-clavulanate (AUGMENTIN) 875-125 MG tablet Take 1 tablet by mouth 2 (two) times daily.  . benzonatate (TESSALON) 200 MG capsule Take 1 capsule (200 mg total) by mouth 3 (three) times  daily as needed for cough.  . predniSONE (DELTASONE) 10 MG tablet 4 tabs for 2 days, then 3 tabs for 2 days, 2 tabs for 2 days, then 1 tab for 2 days, then stop   No facility-administered encounter medications on file as of 09/27/2019.     Review of Systems  Review of Systems  Constitutional: Positive for fatigue. Negative for activity change and fever.  HENT: Positive for congestion and ear pain. Negative for sinus pressure, sinus pain and sore throat.   Respiratory: Positive for cough, shortness of breath and wheezing.   Cardiovascular: Negative for chest pain and palpitations.  Gastrointestinal: Negative for diarrhea, nausea and vomiting.  Musculoskeletal: Negative for arthralgias.  Neurological: Negative for dizziness.  Psychiatric/Behavioral: Positive for sleep disturbance. The patient is not nervous/anxious.      Physical Exam  BP 128/74 (BP Location: Left Arm, Cuff Size: Normal)   Pulse 86   Temp (!) 97.2 F (36.2 C) (Temporal)   Ht 5\' 8"  (1.727 m)   Wt 263 lb 3.2 oz (119.4 kg)   SpO2 98% Comment: RA  BMI 40.02 kg/m   Wt Readings from Last 5 Encounters:  09/27/19 263 lb 3.2 oz (119.4 kg)  04/01/19 273 lb 1.6 oz (123.9 kg)  03/29/19 273 lb 1.6 oz (123.9 kg)  03/14/18 271 lb (122.9 kg)  02/12/18 269 lb 2 oz (122.1 kg)    BMI Readings from Last 5 Encounters:  09/27/19 40.02 kg/m  04/01/19 42.77 kg/m  03/29/19 42.77 kg/m  03/14/18 41.21 kg/m  02/12/18 40.92 kg/m     Physical Exam Vitals and nursing note reviewed.  Constitutional:      General: She is not in acute distress.    Appearance: Normal appearance. She is obese.  HENT:     Head: Normocephalic and atraumatic.     Right Ear: Tympanic membrane, ear canal and external ear normal. There is no impacted cerumen.     Left Ear: Tympanic membrane, ear canal and  external ear normal. There is no impacted cerumen.     Ears:     Comments: Right ear with effusion, clear, no signs of infection    Nose:  Rhinorrhea present. No congestion.     Mouth/Throat:     Mouth: Mucous membranes are moist.     Pharynx: Oropharynx is clear.  Eyes:     Pupils: Pupils are equal, round, and reactive to light.  Cardiovascular:     Rate and Rhythm: Normal rate and regular rhythm.     Pulses: Normal pulses.     Heart sounds: Normal heart sounds. No murmur.  Pulmonary:     Effort: Pulmonary effort is normal. No respiratory distress.     Breath sounds: No decreased air movement. No decreased breath sounds, wheezing or rales.     Comments: Multiple episodes of cough during physical exam Musculoskeletal:     Cervical back: Normal range of motion.  Skin:    General: Skin is warm and dry.     Capillary Refill: Capillary refill takes less than 2 seconds.  Neurological:     General: No focal deficit present.     Mental Status: She is alert and oriented to person, place, and time. Mental status is at baseline.     Gait: Gait normal.  Psychiatric:        Mood and Affect: Mood normal.        Behavior: Behavior normal.        Thought Content: Thought content normal.        Judgment: Judgment normal.       Assessment & Plan:   Cough 2019 high-resolution CT chest shows air trapping Pulmonary function testing did not show formal obstruction in 2019 Recurrent cough episodes since April/08/2019 Dry, nonproductive Cough so significant that can cause incontinence as well as emesis Cough seems to be responsive to [employer] Status post 1 Z-Pak treatment Negative Covid test in April/2021 Clear chest x-ray in April/2021  Plan: We will treat course is a complicated version of bronchitis Augmentin today Prednisone taper today Start daily antihistamine which patient can take at night Continue Flonase Start nasal saline rinses Close follow-up with our office to ensure the symptoms are improving  Right ear pain Worsened right ear pain Physical exam shows effusion, without infection  Plan: Start  daily antihistamine Prednisone taper today Start Flonase Start nasal saline rinses Augmentin course Close follow-up with our office    Return in about 4 weeks (around 10/25/2019), or if symptoms worsen or fail to improve, for Follow up with Dr. Purnell Shoemaker.   Lauraine Rinne, NP 09/27/2019   This appointment required 33 minutes of patient care (this includes precharting, chart review, review of results, face-to-face care, etc.).

## 2019-10-01 DIAGNOSIS — R8290 Unspecified abnormal findings in urine: Secondary | ICD-10-CM | POA: Diagnosis not present

## 2019-10-01 DIAGNOSIS — Z113 Encounter for screening for infections with a predominantly sexual mode of transmission: Secondary | ICD-10-CM | POA: Diagnosis not present

## 2019-10-04 DIAGNOSIS — Z794 Long term (current) use of insulin: Secondary | ICD-10-CM | POA: Diagnosis not present

## 2019-10-04 DIAGNOSIS — E1165 Type 2 diabetes mellitus with hyperglycemia: Secondary | ICD-10-CM | POA: Diagnosis not present

## 2019-10-30 NOTE — Progress Notes (Signed)
@Patient  ID: [name], female    DOB: 01/07/1981, 39 y.o.   MRN: [id number]  Chief Complaint  Patient presents with  . Follow-up    cough improved, but still having nonproductive hacking cough, still having sore throat and ear pain    Referring provider: Antony Contras, MD  HPI:  39 year old female never smoker followed in our office for dyspnea on exertion  PMH: Atypical chest pain, status post lumbar disc herniation, obesity Smoker/ Smoking History: Never smoker Maintenance: None Pt of: Dr. Chase Caller  10/31/2019  - Visit   39 year old female never smoker followed in our office for dyspnea on exertion. She is followed by Dr. Chase Caller. She was last seen in our office in May/2021. Patient also had a suspected ear infection at that office visit. Patient was treated with Augmentin as well as prednisone. Patient is scheduled to have a 4-week follow-up with our office at that time. Patient presenting today for further review.  Patient presenting today as a follow-up. She reports that her cough is improved but still having a nonproductive hacking cough. Still having sore throat as well as ear pain.  Patient reports that the cough is not nearly as severe and she is no longer having ear pressure.  Ears still feel full.  She reports that she does have history of seasonal allergies.  She is started to take a daily antihistamine.  She has Flonase prescribed to her but she only uses this when she is "feeling really bad".  She does not use this regularly.  She is not using any nasal saline rinses.  Questionaires / Pulmonary Flowsheets:   ACT:  No flowsheet data found.  MMRC: mMRC Dyspnea Scale mMRC Score  09/27/2019 2    Epworth:  No flowsheet data found.  Tests:   08/27/2019-chest x-ray-no active cardiopulmonary disease  09/08/2017-CT chest high-res-no evidence of ILD, mild air trapping indicative of small airways disease, hepatic stenosis  10/31/2017-pulmonary function test-FVC  3.05 (72% predicted), postbronchodilator ratio 82, postbronchodilator FEV1 2.51 (72% predicted), no bronchodilator response, DLCO 28.40 (95% predicted) ERV low-0.74 (42% retention)   FENO:  Lab Results  Component Value Date   NITRICOXIDE <5 08/31/2017    PFT: PFT Results Latest Ref Rng & Units 10/31/2017 09/26/2014  FVC-Pre L 3.05 2.91  FVC-Predicted Pre % 72 68  FVC-Post L 3.06 3.03  FVC-Predicted Post % 72 71  Pre FEV1/FVC % % 82 83  Post FEV1/FCV % % 82 82  FEV1-Pre L 2.49 2.41  FEV1-Predicted Pre % 72 68  FEV1-Post L 2.51 2.49  DLCO UNC% % 95 103  DLCO COR %Predicted % 125 141  TLC L 4.53 4.47  TLC % Predicted % 80 79  RV % Predicted % 82 91    WALK:  SIX MIN WALK 08/31/2017  Tech Comments: Walked at a normal pace and tolerated well. Denies SOB.     Imaging: No results found.  Lab Results:  CBC    Component Value Date/Time   WBC 8.6 03/29/2019 0905   RBC 4.72 03/29/2019 0905   HGB 14.1 03/29/2019 0905   HCT 42.5 03/29/2019 0905   PLT 320 03/29/2019 0905   MCV 90.0 03/29/2019 0905   MCV 87.3 12/07/2016 1427   MCH 29.9 03/29/2019 0905   MCHC 33.2 03/29/2019 0905   RDW 12.4 03/29/2019 0905   LYMPHSABS 1.1 04/19/2010 1309   MONOABS 0.2 04/19/2010 1309   EOSABS 0.0 04/19/2010 1309   BASOSABS 0.0 04/19/2010 1309    BMET  Component Value Date/Time   NA 136 03/29/2019 0905   K 4.0 03/29/2019 0905   CL 99 03/29/2019 0905   CO2 26 03/29/2019 0905   GLUCOSE 330 (H) 03/29/2019 0905   BUN 7 03/29/2019 0905   CREATININE 0.54 03/29/2019 0905   CALCIUM 8.7 (L) 03/29/2019 0905   GFRNONAA >60 03/29/2019 0905   GFRAA >60 03/29/2019 0905    BNP No results found for: BNP  ProBNP No results found for: PROBNP  Specialty Problems      Pulmonary Problems   Cough   Allergic rhinitis      Allergies  Allergen Reactions  . Latex Rash    Immunization History  Administered Date(s) Administered  . Influenza Split 02/03/2014  . Influenza,inj,Quad PF,6+  Mos 02/17/2019  . Influenza,inj,Quad PF,6-35 Mos 03/10/2019  . Influenza-Unspecified 02/18/2013  . Pneumococcal Polysaccharide-23 11/27/2012    Past Medical History:  Diagnosis Date  . Back pain   . Carpal tunnel syndrome    bilateral  . Complication of anesthesia    "hard to wake up"  . Diabetes mellitus without complication ([place])   . Dyspnea    with excersion  . Family history of adverse reaction to anesthesia    mom voniting  . Obesity   . PONV (postoperative nausea and vomiting)   . Tricuspid valve regurgitation    also mitral regurg- trace on both per echo    Tobacco History: Social History   Tobacco Use  Smoking Status Never Smoker  Smokeless Tobacco Never Used   Counseling given: Not Answered   Continue to not smoke  Outpatient Encounter Medications as of 10/31/2019  Medication Sig  . albuterol (PROVENTIL HFA;VENTOLIN HFA) 108 (90 Base) MCG/ACT inhaler Inhale 2 puffs into the lungs every 6 (six) hours as needed for wheezing or shortness of breath.   Marland Kitchen aspirin 81 MG tablet Take 81 mg by mouth at bedtime.   Marland Kitchen atorvastatin (LIPITOR) 10 MG tablet Take 10 mg by mouth every evening.   . benzonatate (TESSALON) 200 MG capsule Take 1 capsule (200 mg total) by mouth 3 (three) times daily as needed for cough.  . Biotin 10 MG CAPS Take 20 mg by mouth every evening.  . Cholecalciferol (DIALYVITE VITAMIN D 5000) 125 MCG (5000 UT) capsule Take 5,000 Units by mouth every evening.  . Cyanocobalamin (B-12 PO) Take 1 capsule by mouth every evening.  . cyclobenzaprine (FLEXERIL) 10 MG tablet Take 10 mg by mouth daily as needed for muscle spasms.   . diphenhydrAMINE (BENADRYL) 25 MG tablet Take 50 mg by mouth daily as needed for allergies.  . Dulaglutide (TRULICITY) 1.5 YS/0.6TK SOPN Inject 1.5 mg into the skin every Sunday.  . fluconazole (DIFLUCAN) 200 MG tablet Take 200 mg by mouth every other day. In the evening  . fluticasone (FLONASE) 50 MCG/ACT nasal spray Place 2 sprays into  both nostrils daily as needed for allergies.   Marland Kitchen gabapentin (NEURONTIN) 300 MG capsule Take 1 capsule (300 mg total) by mouth 3 (three) times daily.  Marland Kitchen HYDROcodone-acetaminophen (NORCO/VICODIN) 5-325 MG tablet Take 1 tablet by mouth daily as needed (back pain).  Marland Kitchen ibuprofen (ADVIL) 800 MG tablet Take 1 tablet (800 mg total) by mouth every 8 (eight) hours as needed.  . Ibuprofen-Famotidine (DUEXIS) 800-26.6 MG TABS Take 1 tablet by mouth 3 (three) times daily.  . Insulin Glargine (BASAGLAR KWIKPEN) 100 UNIT/ML SOPN Inject 100 Units into the skin daily with lunch.   . linaclotide (LINZESS) 145 MCG CAPS capsule Take  1 capsule (145 mcg total) by mouth daily as needed. (Patient taking differently: Take 145 mcg by mouth daily. )  . losartan (COZAAR) 25 MG tablet Take 25 mg by mouth every evening.  . Melatonin 3 MG CAPS Take 3 mg by mouth at bedtime.  . metFORMIN (GLUCOPHAGE-XR) 500 MG 24 hr tablet Take 2,000 mg by mouth daily with supper.  . methocarbamol (ROBAXIN) 500 MG tablet Take 1 tablet (500 mg total) by mouth 3 (three) times daily. (Patient taking differently: Take 500 mg by mouth daily as needed (severe muscle spams). )  . Multiple Vitamins-Minerals (HAIR/SKIN/NAILS) CAPS Take 3 capsules by mouth every evening.  . nitroGLYCERIN (NITROSTAT) 0.4 MG SL tablet Place 0.4 mg under the tongue every 5 (five) minutes as needed for chest pain.  Marland Kitchen NOVOLOG 100 UNIT/ML injection Inject 4 Units into the skin. With each meal  . oxybutynin (DITROPAN-XL) 5 MG 24 hr tablet Take 5 mg by mouth daily.  . polyethylene glycol (MIRALAX / GLYCOLAX) 17 g packet Take 17 g by mouth daily.  . [DISCONTINUED] amoxicillin-clavulanate (AUGMENTIN) 875-125 MG tablet Take 1 tablet by mouth 2 (two) times daily.  . [DISCONTINUED] predniSONE (DELTASONE) 10 MG tablet 4 tabs for 2 days, then 3 tabs for 2 days, 2 tabs for 2 days, then 1 tab for 2 days, then stop   No facility-administered encounter medications on file as of 10/31/2019.       Review of Systems  Review of Systems  Constitutional: Negative for activity change, fatigue and fever.  HENT: Positive for congestion, postnasal drip, rhinorrhea and sore throat. Negative for sinus pressure and sinus pain.   Respiratory: Positive for cough. Negative for shortness of breath and wheezing.   Cardiovascular: Negative for chest pain and palpitations.  Gastrointestinal: Negative for diarrhea, nausea and vomiting.  Musculoskeletal: Negative for arthralgias.  Neurological: Negative for dizziness.  Psychiatric/Behavioral: Negative for sleep disturbance. The patient is not nervous/anxious.      Physical Exam  BP 124/66 (BP Location: Right Arm, Cuff Size: Large)   Pulse 68   Temp 98.3 F (36.8 C) (Oral)   Ht 5\' 8"  (1.727 m)   Wt 263 lb 12.8 oz (119.7 kg)   SpO2 98% Comment: RA  BMI 40.11 kg/m   Wt Readings from Last 5 Encounters:  10/31/19 263 lb 12.8 oz (119.7 kg)  09/27/19 263 lb 3.2 oz (119.4 kg)  04/01/19 273 lb 1.6 oz (123.9 kg)  03/29/19 273 lb 1.6 oz (123.9 kg)  03/14/18 271 lb (122.9 kg)    BMI Readings from Last 5 Encounters:  10/31/19 40.11 kg/m  09/27/19 40.02 kg/m  04/01/19 42.77 kg/m  03/29/19 42.77 kg/m  03/14/18 41.21 kg/m     Physical Exam Vitals and nursing note reviewed.  Constitutional:      General: She is not in acute distress.    Appearance: Normal appearance. She is obese.  HENT:     Head: Normocephalic and atraumatic.     Right Ear: Tympanic membrane, ear canal and external ear normal. There is no impacted cerumen.     Left Ear: Tympanic membrane, ear canal and external ear normal. There is no impacted cerumen.     Ears:     Comments: TMs with effusion bilaterally, without infection, left greater than right    Nose: Rhinorrhea present. No congestion.     Mouth/Throat:     Mouth: Mucous membranes are moist.     Pharynx: Oropharynx is clear.     Comments: Postnasal drip  Eyes:     Pupils: Pupils are equal, round, and  reactive to light.  Cardiovascular:     Rate and Rhythm: Normal rate and regular rhythm.     Pulses: Normal pulses.     Heart sounds: Normal heart sounds. No murmur heard.   Pulmonary:     Effort: Pulmonary effort is normal. No respiratory distress.     Breath sounds: Normal breath sounds. No decreased air movement. No decreased breath sounds, wheezing or rales.  Musculoskeletal:     Cervical back: Normal range of motion.  Skin:    General: Skin is warm and dry.     Capillary Refill: Capillary refill takes less than 2 seconds.  Neurological:     General: No focal deficit present.     Mental Status: She is alert and oriented to person, place, and time. Mental status is at baseline.     Gait: Gait normal.  Psychiatric:        Mood and Affect: Mood normal.        Behavior: Behavior normal.        Thought Content: Thought content normal.        Judgment: Judgment normal.       Assessment & Plan:   Allergic rhinitis Plan: Continue daily allergy pill Resume Flonase Start nasal saline rinses If symptoms persist could consider referral to ear nose and throat for further evaluation  Cough Appears to be an allergic rhinitis flare  Plan: Continue daily and histamine Can start nasal saline rinses Resume Flonase use  Chest pain with high risk for cardiac etiology Patient with history of chronic atypical chest pain  Patient reporting to our office today she does not currently have any chest pain but occasionally over the past couple weeks has had some chest tightness and pain.  CV exam today stable  Plan: Encourage patient to schedule follow-up with primary care to further discuss and evaluate Could consider referral back to cardiology    Return in about 6 months (around 05/01/2020), or if symptoms worsen or fail to improve, for Follow up with Dr. Purnell Shoemaker.   Lauraine Rinne, NP 10/31/2019   This appointment required 26 minutes of patient care (this includes  precharting, chart review, review of results, face-to-face care, etc.).

## 2019-10-31 ENCOUNTER — Other Ambulatory Visit: Payer: Self-pay

## 2019-10-31 ENCOUNTER — Ambulatory Visit (INDEPENDENT_AMBULATORY_CARE_PROVIDER_SITE_OTHER): Payer: Federal, State, Local not specified - PPO | Admitting: Pulmonary Disease

## 2019-10-31 ENCOUNTER — Encounter: Payer: Self-pay | Admitting: Pulmonary Disease

## 2019-10-31 VITALS — BP 124/66 | HR 68 | Temp 98.3°F | Ht 68.0 in | Wt 263.8 lb

## 2019-10-31 DIAGNOSIS — R059 Cough, unspecified: Secondary | ICD-10-CM

## 2019-10-31 DIAGNOSIS — R079 Chest pain, unspecified: Secondary | ICD-10-CM | POA: Diagnosis not present

## 2019-10-31 DIAGNOSIS — J309 Allergic rhinitis, unspecified: Secondary | ICD-10-CM | POA: Diagnosis not present

## 2019-10-31 DIAGNOSIS — R05 Cough: Secondary | ICD-10-CM

## 2019-10-31 NOTE — Assessment & Plan Note (Signed)
Plan: Continue daily allergy pill Resume Flonase Start nasal saline rinses If symptoms persist could consider referral to ear nose and throat for further evaluation

## 2019-10-31 NOTE — Patient Instructions (Addendum)
You were seen today by Lauraine Rinne, NP  for:   1. Cough 2. Allergic rhinitis, unspecified seasonality, unspecified trigger  Continue daily allergy pill  Start nasal saline rinses twice daily Use distilled water Shake well Get bottle lukewarm like a baby bottle  Sample provided today  Use Flonase 1 spray each nostril after each saline rinse  If symptoms persist or worsen despite these interventions please let our office know and we can refer you to ear nose and throat     For the occasional atypical chest pain that you are having but not currently please follow-up with primary care >>>If symptoms worsen in the meantime please present to an urgent care or the emergency room for further interventions  Follow Up:    Return in about 6 months (around 05/01/2020), or if symptoms worsen or fail to improve, for Follow up with Dr. Purnell Shoemaker.   Please do your part to reduce the spread of COVID-19:      Reduce your risk of any infection  and COVID19 by using the similar precautions used for avoiding the common cold or flu:  Marland Kitchen Wash your hands often with soap and warm water for at least 20 seconds.  If soap and water are not readily available, use an alcohol-based hand sanitizer with at least 60% alcohol.  . If coughing or sneezing, cover your mouth and nose by coughing or sneezing into the elbow areas of your shirt or coat, into a tissue or into your sleeve (not your hands). Langley Gauss A MASK when in public  . Avoid shaking hands with others and consider head nods or verbal greetings only. . Avoid touching your eyes, nose, or mouth with unwashed hands.  . Avoid close contact with people who are sick. . Avoid places or events with large numbers of people in one location, like concerts or sporting events. . If you have some symptoms but not all symptoms, continue to monitor at home and seek medical attention if your symptoms worsen. . If you are having a medical emergency, call  911.   [place] / e-Visit: [email or web address]         [facility]: [facility]: [phone]                   [facility]: [phone]     It is flu season:   >>> Best ways to protect herself from the flu: Receive the yearly flu vaccine, practice good hand hygiene washing with soap and also using hand sanitizer when available, eat a nutritious meals, get adequate rest, hydrate appropriately   Please contact the office if your symptoms worsen or you have concerns that you are not improving.   Thank you for choosing [facility] for your healthcare, and for allowing [place] to partner with you on your healthcare journey. I am thankful to be able to provide care to you today.   Wyn Quaker FNP-C

## 2019-10-31 NOTE — Assessment & Plan Note (Signed)
Appears to be an allergic rhinitis flare  Plan: Continue daily and histamine Can start nasal saline rinses Resume Flonase use

## 2019-10-31 NOTE — Assessment & Plan Note (Signed)
Patient with history of chronic atypical chest pain  Patient reporting to our office today she does not currently have any chest pain but occasionally over the past couple weeks has had some chest tightness and pain.  CV exam today stable  Plan: Encourage patient to schedule follow-up with primary care to further discuss and evaluate Could consider referral back to cardiology

## 2019-11-04 DIAGNOSIS — Z794 Long term (current) use of insulin: Secondary | ICD-10-CM | POA: Diagnosis not present

## 2019-11-04 DIAGNOSIS — E1165 Type 2 diabetes mellitus with hyperglycemia: Secondary | ICD-10-CM | POA: Diagnosis not present

## 2019-12-12 DIAGNOSIS — G5761 Lesion of plantar nerve, right lower limb: Secondary | ICD-10-CM | POA: Diagnosis not present

## 2019-12-12 DIAGNOSIS — M7751 Other enthesopathy of right foot: Secondary | ICD-10-CM | POA: Diagnosis not present

## 2019-12-20 DIAGNOSIS — E1165 Type 2 diabetes mellitus with hyperglycemia: Secondary | ICD-10-CM | POA: Diagnosis not present

## 2019-12-20 DIAGNOSIS — Z794 Long term (current) use of insulin: Secondary | ICD-10-CM | POA: Diagnosis not present

## 2019-12-26 DIAGNOSIS — M7751 Other enthesopathy of right foot: Secondary | ICD-10-CM | POA: Diagnosis not present

## 2019-12-26 DIAGNOSIS — G5761 Lesion of plantar nerve, right lower limb: Secondary | ICD-10-CM | POA: Diagnosis not present

## 2019-12-27 ENCOUNTER — Other Ambulatory Visit: Payer: Self-pay

## 2019-12-27 ENCOUNTER — Encounter (HOSPITAL_COMMUNITY): Payer: Self-pay

## 2019-12-27 ENCOUNTER — Ambulatory Visit (HOSPITAL_COMMUNITY)
Admission: EM | Admit: 2019-12-27 | Discharge: 2019-12-27 | Disposition: A | Discharge status: Home / Self Care | Payer: Federal, State, Local not specified - PPO | Attending: Family Medicine | Admitting: Family Medicine

## 2019-12-27 DIAGNOSIS — Z20822 Contact with and (suspected) exposure to covid-19: Secondary | ICD-10-CM | POA: Insufficient documentation

## 2019-12-27 DIAGNOSIS — J069 Acute upper respiratory infection, unspecified: Secondary | ICD-10-CM | POA: Diagnosis not present

## 2019-12-27 MED ORDER — BENZONATATE 200 MG PO CAPS: 200.0000 mg | ORAL_CAPSULE | Freq: Three times a day (TID) | ORAL | 0 refills | Status: DC | PRN

## 2019-12-27 NOTE — ED Provider Notes (Signed)
[facility]    CSN: [id number] Arrival date & time: 12/27/19  1737      History   Chief Complaint Chief Complaint  Patient presents with  . Cough  . Sore Throat    HPI [name] is a 39 y.o. female.   HPI  Patient [place]  There are a lot of people in closed in places.  Not everybody wear masks..  Not everybody distances properly.  No known exposure to Covid.  States her husband is sick as well.  She has had a sore throat nonproductive cough congestion headache body ache runny nose since Tuesday, 3 days ago.  No loss of taste or smell.  No nausea or vomiting.  States she is moderately short of breath He has not had Covid vaccinations  Past Medical History:  Diagnosis Date  . Back pain   . Carpal tunnel syndrome    bilateral  . Complication of anesthesia    "hard to wake up"  . Diabetes mellitus without complication ([place])   . Dyspnea    with excersion  . Family history of adverse reaction to anesthesia    mom voniting  . Obesity   . PONV (postoperative nausea and vomiting)   . Tricuspid valve regurgitation    also mitral regurg- trace on both per echo    Patient Active Problem List   Diagnosis Date Noted  . Allergic rhinitis 10/31/2019  . Cough 09/27/2019  . Right ear pain 09/27/2019  . 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  . Chest pain with high risk for cardiac etiology 11/22/2015    Past Surgical History:  Procedure Laterality Date  . CARDIAC CATHETERIZATION N/A 11/24/2015   Procedure: Left Heart Cath and Coronary Angiography;  Surgeon: Adrian Prows, MD;  Location: [facility];  Service: Cardiovascular;  Laterality: N/A;  . CESAREAN SECTION    . DILATION AND CURETTAGE OF UTERUS     x4  . LUMBAR LAMINECTOMY/DECOMPRESSION MICRODISCECTOMY N/A 12/14/2016   Procedure: LUMBAR DECOMPRESSION MICRODISCECTOMY L5-S1;  Surgeon: Melina Schools, MD;  Location: [facility];  Service: Orthopedics;  Laterality: N/A;  120 mins  . MASS EXCISION  Right 04/01/2019   Procedure: EXCISION RIGHT ARM MASS;  Surgeon: Kinsinger, Arta Bruce, MD;  Location: [facility];  Service: General;  Laterality: Right;    OB History   No obstetric history on file.      Home Medications    Prior to Admission medications   Medication Sig Start Date End Date Taking? Authorizing Provider  albuterol (PROVENTIL HFA;VENTOLIN HFA) 108 (90 Base) MCG/ACT inhaler Inhale 2 puffs into the lungs every 6 (six) hours as needed for wheezing or shortness of breath.    Yes [provider]  aspirin 81 MG tablet Take 81 mg by mouth at bedtime.    Yes [provider]  atorvastatin (LIPITOR) 10 MG tablet Take 10 mg by mouth every evening.    Yes [provider]  Dulaglutide (TRULICITY) 1.5 HX/5.0VW SOPN Inject 1.5 mg into the skin every Sunday.   Yes [provider]  gabapentin (NEURONTIN) 300 MG capsule Take 1 capsule (300 mg total) by mouth 3 (three) times daily. 03/14/18  Yes Tomi Likens, Adam R, DO  Insulin Glargine (BASAGLAR KWIKPEN) 100 UNIT/ML SOPN Inject 100 Units into the skin daily with lunch.    Yes [provider]  linaclotide (LINZESS) 145 MCG CAPS capsule Take 1 capsule (145 mcg total) by mouth daily as needed. Patient taking differently:  Take 145 mcg by mouth daily.  02/12/18  Yes Ladene Artist, MD  losartan (COZAAR) 25 MG tablet Take 25 mg by mouth every evening.   Yes [provider]  metFORMIN (GLUCOPHAGE-XR) 500 MG 24 hr tablet Take 2,000 mg by mouth daily with supper.   Yes [provider]  NOVOLOG 100 UNIT/ML injection Inject 4 Units into the skin. With each meal 10/09/19  Yes [provider]  oxybutynin (DITROPAN-XL) 5 MG 24 hr tablet Take 5 mg by mouth daily.   Yes [provider]  benzonatate (TESSALON) 200 MG capsule Take 1 capsule (200 mg total) by mouth 3 (three) times daily as needed for cough. 12/27/19   Raylene Everts, MD  Biotin 10 MG CAPS Take 20 mg by mouth every evening.     [provider]  Cholecalciferol (DIALYVITE VITAMIN D 5000) 125 MCG (5000 UT) capsule Take 5,000 Units by mouth every evening.    [provider]  Cyanocobalamin (B-12 PO) Take 1 capsule by mouth every evening.    [provider]  cyclobenzaprine (FLEXERIL) 10 MG tablet Take 10 mg by mouth daily as needed for muscle spasms.  10/05/16   [provider]  diphenhydrAMINE (BENADRYL) 25 MG tablet Take 50 mg by mouth daily as needed for allergies.    [provider]  fluconazole (DIFLUCAN) 200 MG tablet Take 200 mg by mouth every other day. In the evening 10/11/16   [provider]  fluticasone (FLONASE) 50 MCG/ACT nasal spray Place 2 sprays into both nostrils daily as needed for allergies.  11/23/16   [provider]  HYDROcodone-acetaminophen (NORCO/VICODIN) 5-325 MG tablet Take 1 tablet by mouth daily as needed (back pain).    [provider]  ibuprofen (ADVIL) 800 MG tablet Take 1 tablet (800 mg total) by mouth every 8 (eight) hours as needed. 04/01/19   Kinsinger, Arta Bruce, MD  Ibuprofen-Famotidine (DUEXIS) 800-26.6 MG TABS Take 1 tablet by mouth 3 (three) times daily.    [provider]  Melatonin 3 MG CAPS Take 3 mg by mouth at bedtime.    [provider]  methocarbamol (ROBAXIN) 500 MG tablet Take 1 tablet (500 mg total) by mouth 3 (three) times daily. Patient taking differently: Take 500 mg by mouth daily as needed (severe muscle spams).  12/15/16   Mayo, Darla Lesches, PA-C  Multiple Vitamins-Minerals (HAIR/SKIN/NAILS) CAPS Take 3 capsules by mouth every evening.    [provider]  nitroGLYCERIN (NITROSTAT) 0.4 MG SL tablet Place 0.4 mg under the tongue every 5 (five) minutes as needed for chest pain.    [provider]  polyethylene glycol (MIRALAX / GLYCOLAX) 17 g packet Take 17 g by mouth daily.    [provider]    Family History Family History  Problem Relation Age of  Onset  . Cancer Father   . Diabetes Father   . Heart disease Father   . Hypertension Father   . Cancer Maternal Grandmother   . Diabetes Maternal Grandfather   . Cancer Paternal Grandmother   . Diabetes Paternal Grandfather     Social History Social History   Tobacco Use  . Smoking status: Never Smoker  . Smokeless tobacco: Never Used  Vaping Use  . Vaping Use: Never used  Substance Use Topics  . Alcohol use: No  . Drug use: No     Allergies   Latex   Review of Systems Review of Systems See HPI  Physical Exam Triage  Vital Signs ED Triage Vitals  Enc Vitals Group     BP 12/27/19 1908 130/72     Pulse Rate 12/27/19 1908 (!) 101     Resp 12/27/19 1908 18     Temp 12/27/19 1908 99.1 F (37.3 C)     Temp Source 12/27/19 1908 Oral     SpO2 12/27/19 1908 98 %     Weight --      Height --      Head Circumference --      Peak Flow --      Pain Score 12/27/19 1905 5     Pain Loc --      Pain Edu? --      Excl. in [place]? --    No data found.  Updated Vital Signs BP 130/72 (BP Location: Right Arm)   Pulse (!) 101   Temp 99.1 F (37.3 C) (Oral)   Resp 18   LMP 12/15/2019   SpO2 98%      Physical Exam Constitutional:      General: She is not in acute distress.    Appearance: She is well-developed. She is obese.     Comments: Appears moderately ill  HENT:     Head: Normocephalic and atraumatic.     Nose: Rhinorrhea present.     Mouth/Throat:     Pharynx: Posterior oropharyngeal erythema present.  Eyes:     Conjunctiva/sclera: Conjunctivae normal.     Pupils: Pupils are equal, round, and reactive to light.  Cardiovascular:     Rate and Rhythm: Tachycardia present.  Pulmonary:     Effort: Pulmonary effort is normal. No respiratory distress.     Breath sounds: Normal breath sounds.     Comments: Lungs are clear Musculoskeletal:        General: Normal range of motion.     Cervical back: Normal range of motion.  Skin:    General: Skin is warm and dry.    Neurological:     Mental Status: She is alert.  Psychiatric:        Mood and Affect: Mood normal.        Behavior: Behavior normal.      UC Treatments / Results  Labs (all labs ordered are listed, but only abnormal results are displayed) Labs Reviewed  SARS CORONAVIRUS 2 (TAT 6-24 HRS)    EKG   Radiology No results found.  Procedures Procedures (including critical care time)  Medications Ordered in UC Medications - No data to display  Initial Impression / Assessment and Plan / UC Course  I have reviewed the triage vital signs and the nursing notes.  Pertinent labs & imaging results that were available during my care of the patient were reviewed by me and considered in my medical decision making (see chart for details).     Reviewed importance of quarantine until test results are available.  Reviewed home care Final Clinical Impressions(s) / UC Diagnoses   Final diagnoses:  Viral URI with cough  Suspected COVID-19 virus infection     Discharge Instructions     Go home to rest Drink plenty of fluids Take Tylenol for pain or fever You may take over-the-counter cough and cold medicines as needed Tessalon as needed cough May add DM if needed You must quarantine at home until your test result is available You can check for your test result in MyChart    ED Prescriptions    Medication Sig Dispense Auth. Provider   benzonatate (TESSALON)  200 MG capsule Take 1 capsule (200 mg total) by mouth 3 (three) times daily as needed for cough. 21 capsule Raylene Everts, MD     PDMP not reviewed this encounter.   Raylene Everts, MD 12/27/19 2217

## 2019-12-27 NOTE — Discharge Instructions (Signed)
Go home to rest Drink plenty of fluids Take Tylenol for pain or fever You may take over-the-counter cough and cold medicines as needed Tessalon as needed cough May add DM if needed You must quarantine at home until your test result is available You can check for your test result in MyChart

## 2019-12-27 NOTE — ED Triage Notes (Signed)
Pt c/o sore throat, non-productive cough, congestion, HA, runny nose, body aches, since Tuesday. Denies fever, chills, n/v, abdominal pain.

## 2019-12-28 LAB — SARS CORONAVIRUS 2 (TAT 6-24 HRS): SARS Coronavirus 2: NEGATIVE

## 2020-01-09 DIAGNOSIS — Z1329 Encounter for screening for other suspected endocrine disorder: Secondary | ICD-10-CM | POA: Diagnosis not present

## 2020-01-09 DIAGNOSIS — N760 Acute vaginitis: Secondary | ICD-10-CM | POA: Diagnosis not present

## 2020-01-09 DIAGNOSIS — E109 Type 1 diabetes mellitus without complications: Secondary | ICD-10-CM | POA: Diagnosis not present

## 2020-01-09 DIAGNOSIS — N926 Irregular menstruation, unspecified: Secondary | ICD-10-CM | POA: Diagnosis not present

## 2020-01-22 DIAGNOSIS — Z113 Encounter for screening for infections with a predominantly sexual mode of transmission: Secondary | ICD-10-CM | POA: Diagnosis not present

## 2020-01-22 DIAGNOSIS — Z01419 Encounter for gynecological examination (general) (routine) without abnormal findings: Secondary | ICD-10-CM | POA: Diagnosis not present

## 2020-01-22 DIAGNOSIS — N390 Urinary tract infection, site not specified: Secondary | ICD-10-CM | POA: Diagnosis not present

## 2020-01-23 DIAGNOSIS — Z01419 Encounter for gynecological examination (general) (routine) without abnormal findings: Secondary | ICD-10-CM | POA: Diagnosis not present

## 2020-03-06 DIAGNOSIS — R309 Painful micturition, unspecified: Secondary | ICD-10-CM | POA: Diagnosis not present

## 2020-03-06 DIAGNOSIS — N898 Other specified noninflammatory disorders of vagina: Secondary | ICD-10-CM | POA: Diagnosis not present

## 2020-03-06 DIAGNOSIS — B373 Candidiasis of vulva and vagina: Secondary | ICD-10-CM | POA: Diagnosis not present

## 2020-03-06 DIAGNOSIS — N390 Urinary tract infection, site not specified: Secondary | ICD-10-CM | POA: Diagnosis not present

## 2020-03-26 ENCOUNTER — Other Ambulatory Visit: Payer: Self-pay | Admitting: Family Medicine

## 2020-03-26 DIAGNOSIS — Z1231 Encounter for screening mammogram for malignant neoplasm of breast: Secondary | ICD-10-CM

## 2020-04-08 ENCOUNTER — Ambulatory Visit
Admission: RE | Admit: 2020-04-08 | Discharge: 2020-04-08 | Disposition: A | Discharge status: Home / Self Care | Payer: Federal, State, Local not specified - PPO | Source: Ambulatory Visit | Attending: Family Medicine | Admitting: Family Medicine

## 2020-04-08 ENCOUNTER — Other Ambulatory Visit: Payer: Self-pay

## 2020-04-08 DIAGNOSIS — N898 Other specified noninflammatory disorders of vagina: Secondary | ICD-10-CM | POA: Diagnosis not present

## 2020-04-08 DIAGNOSIS — Z1231 Encounter for screening mammogram for malignant neoplasm of breast: Secondary | ICD-10-CM

## 2020-04-08 DIAGNOSIS — Z113 Encounter for screening for infections with a predominantly sexual mode of transmission: Secondary | ICD-10-CM | POA: Diagnosis not present

## 2020-04-08 DIAGNOSIS — R102 Pelvic and perineal pain: Secondary | ICD-10-CM | POA: Diagnosis not present

## 2020-04-08 DIAGNOSIS — N760 Acute vaginitis: Secondary | ICD-10-CM | POA: Diagnosis not present

## 2020-04-23 DIAGNOSIS — M792 Neuralgia and neuritis, unspecified: Secondary | ICD-10-CM | POA: Diagnosis not present

## 2020-04-23 DIAGNOSIS — H40013 Open angle with borderline findings, low risk, bilateral: Secondary | ICD-10-CM | POA: Diagnosis not present

## 2020-05-11 DIAGNOSIS — F321 Major depressive disorder, single episode, moderate: Secondary | ICD-10-CM | POA: Diagnosis not present

## 2020-05-20 DIAGNOSIS — Z1152 Encounter for screening for COVID-19: Secondary | ICD-10-CM | POA: Diagnosis not present

## 2020-05-28 DIAGNOSIS — U071 COVID-19: Secondary | ICD-10-CM | POA: Diagnosis not present

## 2020-05-28 DIAGNOSIS — F321 Major depressive disorder, single episode, moderate: Secondary | ICD-10-CM | POA: Diagnosis not present

## 2020-05-28 DIAGNOSIS — R059 Cough, unspecified: Secondary | ICD-10-CM | POA: Diagnosis not present

## 2020-06-18 DIAGNOSIS — M7751 Other enthesopathy of right foot: Secondary | ICD-10-CM | POA: Diagnosis not present

## 2020-06-18 DIAGNOSIS — G5761 Lesion of plantar nerve, right lower limb: Secondary | ICD-10-CM | POA: Diagnosis not present

## 2020-06-22 DIAGNOSIS — K529 Noninfective gastroenteritis and colitis, unspecified: Secondary | ICD-10-CM | POA: Diagnosis not present

## 2020-06-29 DIAGNOSIS — R2232 Localized swelling, mass and lump, left upper limb: Secondary | ICD-10-CM | POA: Diagnosis not present

## 2020-06-29 DIAGNOSIS — S0101XA Laceration without foreign body of scalp, initial encounter: Secondary | ICD-10-CM | POA: Diagnosis not present

## 2020-06-29 DIAGNOSIS — S60222A Contusion of left hand, initial encounter: Secondary | ICD-10-CM | POA: Diagnosis not present

## 2020-06-29 DIAGNOSIS — S300XXA Contusion of lower back and pelvis, initial encounter: Secondary | ICD-10-CM | POA: Diagnosis not present

## 2020-07-14 ENCOUNTER — Ambulatory Visit: Payer: Federal, State, Local not specified - PPO | Admitting: Neurology

## 2020-07-23 DIAGNOSIS — Z113 Encounter for screening for infections with a predominantly sexual mode of transmission: Secondary | ICD-10-CM | POA: Diagnosis not present

## 2020-07-23 DIAGNOSIS — N9089 Other specified noninflammatory disorders of vulva and perineum: Secondary | ICD-10-CM | POA: Diagnosis not present

## 2020-07-23 DIAGNOSIS — N760 Acute vaginitis: Secondary | ICD-10-CM | POA: Diagnosis not present

## 2020-08-11 DIAGNOSIS — F321 Major depressive disorder, single episode, moderate: Secondary | ICD-10-CM | POA: Diagnosis not present

## 2020-08-17 DIAGNOSIS — M79641 Pain in right hand: Secondary | ICD-10-CM | POA: Diagnosis not present

## 2020-08-17 DIAGNOSIS — G5603 Carpal tunnel syndrome, bilateral upper limbs: Secondary | ICD-10-CM | POA: Diagnosis not present

## 2020-08-17 DIAGNOSIS — M79642 Pain in left hand: Secondary | ICD-10-CM | POA: Diagnosis not present

## 2020-08-18 ENCOUNTER — Encounter: Payer: Self-pay | Admitting: Neurology

## 2020-08-18 ENCOUNTER — Other Ambulatory Visit: Payer: Self-pay

## 2020-08-18 DIAGNOSIS — R202 Paresthesia of skin: Secondary | ICD-10-CM

## 2020-08-18 DIAGNOSIS — G5751 Tarsal tunnel syndrome, right lower limb: Secondary | ICD-10-CM | POA: Diagnosis not present

## 2020-08-18 DIAGNOSIS — M25571 Pain in right ankle and joints of right foot: Secondary | ICD-10-CM | POA: Diagnosis not present

## 2020-08-18 DIAGNOSIS — M792 Neuralgia and neuritis, unspecified: Secondary | ICD-10-CM | POA: Diagnosis not present

## 2020-08-25 DIAGNOSIS — G5751 Tarsal tunnel syndrome, right lower limb: Secondary | ICD-10-CM | POA: Diagnosis not present

## 2020-08-26 ENCOUNTER — Other Ambulatory Visit: Payer: Self-pay

## 2020-08-26 ENCOUNTER — Ambulatory Visit: Payer: Federal, State, Local not specified - PPO | Admitting: Neurology

## 2020-08-26 DIAGNOSIS — R202 Paresthesia of skin: Secondary | ICD-10-CM

## 2020-08-26 DIAGNOSIS — G5603 Carpal tunnel syndrome, bilateral upper limbs: Secondary | ICD-10-CM

## 2020-08-26 NOTE — Procedures (Signed)
[facility]  [address]  [place], [place] [address] Tel: [phone] Fax:  [phone] Test Date:  08/26/2020  Patient: [name] DOB: [birth year] Physician: Narda Amber, DO  Sex: Female Height: 5\' 8"  Ref Phys: Raynaldo Opitz  ID#: [id number]   Technician:    Patient Complaints: This is a 40 year old female with type 2 diabetes referred for evaluation of bilateral hand numbness and tingling.  NCV & EMG Findings: Extensive electrodiagnostic testing of the right upper extremity and additional studies of the left shows:  1. Right median sensory response shows prolonged latency (3.6 ms) and reduced amplitude (15.2 V).  Left median sensory response shows prolonged latency (3.5 ms).  Bilateral ulnar sensory responses are within normal limits. 2. Right median motor response shows prolonged latency (4.0 ms).  Left median motor and bilateral ulnar motor responses are within normal limits.  Incidentally, there is evidence of a bilateral Martin-Gruber anastomosis, a normal anatomical variant.   3. There is no evidence of active or chronic motor axonal loss changes affecting any of the tested muscles.  Motor unit configuration and recruitment pattern is within normal limits.  Impression: 1. 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   2. Left median neuropathy at or distal to the wrist (mild), consistent with a clinical diagnosis of carpal tunnel syndrome.   3. There is no evidence of a sensorimotor polyneuropathy affecting the upper extremities.   ___________________________ Narda Amber, DO    Nerve Conduction Studies Anti Sensory Summary Table   Stim Site NR Peak (ms) Norm Peak (ms) P-T Amp (V) Norm P-T Amp  Left Median Anti Sensory (2nd Digit)  34C  Wrist    3.5 <3.4 23.1 >20  Right Median Anti Sensory (2nd Digit)  34C  Wrist    3.6 <3.4 15.2 >20  Left Ulnar Anti Sensory  (5th Digit)  34C  Wrist    2.6 <3.1 28.3 >12  Right Ulnar Anti Sensory (5th Digit)  34C  Wrist    2.4 <3.1 23.4 >12   Motor Summary Table   Stim Site NR Onset (ms) Norm Onset (ms) O-P Amp (mV) Norm O-P Amp Site1 Site2 Delta-0 (ms) Dist (cm) Vel (m/s) Norm Vel (m/s)  Left Median Motor (Abd Poll Brev)  34C  Wrist    3.4 <3.9 6.9 >6 Elbow Wrist 5.7 29.0 51 >50  Elbow    9.1  6.1  Ulnar-wrist crossover Elbow 4.7 0.0    Ulnar-wrist crossover    4.4  3.4         Right Median Motor (Abd Poll Brev)  34C  Wrist    4.0 <3.9 7.3 >6 Elbow Wrist 5.0 28.0 56 >50  Elbow    9.0  6.0  Ulnar-wrist crossover Elbow 4.9 0.0    Ulnar-wrist crossover    4.1  3.2         Left Ulnar Motor (Abd Dig Minimi)  34C  Wrist    2.3 <3.1 9.6 >7 B Elbow Wrist 4.0 22.0 55 >50  B Elbow    6.3  8.8  A Elbow B Elbow 1.9 10.0 53 >50  A Elbow    8.2  8.6         Right Ulnar Motor (Abd Dig Minimi)  34C  Wrist    2.7 <3.1 8.0 >7 B Elbow Wrist 4.2 22.0 52 >50  B Elbow    6.9  7.9  A  Elbow B Elbow 1.7 10.0 59 >50  A Elbow    8.6  7.8          EMG   Side Muscle Ins Act Fibs Psw Fasc Number Recrt Dur Dur. Amp Amp. Poly Poly. Comment  Right 1stDorInt Nml Nml Nml Nml Nml Nml Nml Nml Nml Nml Nml Nml N/A  Right Abd Poll Brev Nml Nml Nml Nml Nml Nml Nml Nml Nml Nml Nml Nml N/A  Right PronatorTeres Nml Nml Nml Nml Nml Nml Nml Nml Nml Nml Nml Nml N/A  Right Biceps Nml Nml Nml Nml Nml Nml Nml Nml Nml Nml Nml Nml N/A  Right Triceps Nml Nml Nml Nml Nml Nml Nml Nml Nml Nml Nml Nml N/A  Right Deltoid Nml Nml Nml Nml Nml Nml Nml Nml Nml Nml Nml Nml N/A  Left 1stDorInt Nml Nml Nml Nml Nml Nml Nml Nml Nml Nml Nml Nml N/A  Left Abd Poll Brev Nml Nml Nml Nml Nml Nml Nml Nml Nml Nml Nml Nml N/A  Left PronatorTeres Nml Nml Nml Nml Nml Nml Nml Nml Nml Nml Nml Nml N/A  Left Biceps Nml Nml Nml Nml Nml Nml Nml Nml Nml Nml Nml Nml N/A  Left Triceps Nml Nml Nml Nml Nml Nml Nml Nml Nml Nml Nml Nml N/A  Left Deltoid Nml Nml Nml Nml Nml Nml Nml  Nml Nml Nml Nml Nml N/A      Waveforms:

## 2020-08-28 ENCOUNTER — Telehealth: Payer: Self-pay | Admitting: Neurology

## 2020-08-28 NOTE — Telephone Encounter (Signed)
Referring office left a message wanting the notes from the patient's EMG from 08/26/20. Fax # [phone]

## 2020-08-28 NOTE — Telephone Encounter (Signed)
Results have been faxed as requested.

## 2020-09-11 ENCOUNTER — Other Ambulatory Visit: Payer: Self-pay | Admitting: Orthopedic Surgery

## 2020-09-11 DIAGNOSIS — G5603 Carpal tunnel syndrome, bilateral upper limbs: Secondary | ICD-10-CM | POA: Diagnosis not present

## 2020-09-11 DIAGNOSIS — G5751 Tarsal tunnel syndrome, right lower limb: Secondary | ICD-10-CM | POA: Diagnosis not present

## 2020-09-14 DIAGNOSIS — B349 Viral infection, unspecified: Secondary | ICD-10-CM | POA: Diagnosis not present

## 2020-09-14 DIAGNOSIS — E1165 Type 2 diabetes mellitus with hyperglycemia: Secondary | ICD-10-CM | POA: Diagnosis not present

## 2020-09-14 DIAGNOSIS — Z20822 Contact with and (suspected) exposure to covid-19: Secondary | ICD-10-CM | POA: Diagnosis not present

## 2020-09-21 DIAGNOSIS — Z794 Long term (current) use of insulin: Secondary | ICD-10-CM | POA: Diagnosis not present

## 2020-09-21 DIAGNOSIS — E669 Obesity, unspecified: Secondary | ICD-10-CM | POA: Diagnosis not present

## 2020-09-21 DIAGNOSIS — E1165 Type 2 diabetes mellitus with hyperglycemia: Secondary | ICD-10-CM | POA: Diagnosis not present

## 2020-10-29 DIAGNOSIS — G5601 Carpal tunnel syndrome, right upper limb: Secondary | ICD-10-CM | POA: Diagnosis not present

## 2020-11-04 DIAGNOSIS — Z23 Encounter for immunization: Secondary | ICD-10-CM | POA: Diagnosis not present

## 2020-11-04 DIAGNOSIS — T148XXA Other injury of unspecified body region, initial encounter: Secondary | ICD-10-CM | POA: Diagnosis not present

## 2020-11-05 ENCOUNTER — Ambulatory Visit (HOSPITAL_BASED_OUTPATIENT_CLINIC_OR_DEPARTMENT_OTHER): Admit: 2020-11-05 | Payer: Federal, State, Local not specified - PPO | Admitting: Orthopedic Surgery

## 2020-11-05 ENCOUNTER — Encounter (HOSPITAL_BASED_OUTPATIENT_CLINIC_OR_DEPARTMENT_OTHER): Payer: Self-pay

## 2020-11-05 SURGERY — CARPAL TUNNEL RELEASE
Anesthesia: Choice | Laterality: Right

## 2020-11-16 DIAGNOSIS — M79642 Pain in left hand: Secondary | ICD-10-CM | POA: Diagnosis not present

## 2020-11-17 ENCOUNTER — Ambulatory Visit
Admission: RE | Admit: 2020-11-17 | Discharge: 2020-11-17 | Disposition: A | Discharge status: Home / Self Care | Payer: Federal, State, Local not specified - PPO | Source: Ambulatory Visit | Attending: Family Medicine | Admitting: Family Medicine

## 2020-11-17 ENCOUNTER — Other Ambulatory Visit: Payer: Self-pay | Admitting: Family Medicine

## 2020-11-17 ENCOUNTER — Other Ambulatory Visit: Payer: Self-pay

## 2020-11-17 DIAGNOSIS — M79642 Pain in left hand: Secondary | ICD-10-CM | POA: Diagnosis not present

## 2020-11-21 ENCOUNTER — Encounter: Payer: Self-pay | Admitting: Emergency Medicine

## 2020-11-21 ENCOUNTER — Ambulatory Visit
Admission: EM | Admit: 2020-11-21 | Discharge: 2020-11-21 | Disposition: A | Discharge status: Home / Self Care | Payer: Federal, State, Local not specified - PPO | Attending: Family Medicine | Admitting: Family Medicine

## 2020-11-21 ENCOUNTER — Other Ambulatory Visit: Payer: Self-pay

## 2020-11-21 DIAGNOSIS — Z202 Contact with and (suspected) exposure to infections with a predominantly sexual mode of transmission: Secondary | ICD-10-CM | POA: Diagnosis not present

## 2020-11-21 DIAGNOSIS — B009 Herpesviral infection, unspecified: Secondary | ICD-10-CM | POA: Diagnosis not present

## 2020-11-21 MED ORDER — VALACYCLOVIR HCL 1 G PO TABS: 1000.0000 mg | ORAL_TABLET | Freq: Three times a day (TID) | ORAL | 0 refills | Status: AC

## 2020-11-21 MED ORDER — DOXYCYCLINE HYCLATE 100 MG PO CAPS: 100.0000 mg | ORAL_CAPSULE | Freq: Two times a day (BID) | ORAL | 0 refills | Status: AC

## 2020-11-21 NOTE — ED Provider Notes (Signed)
[facility]    CSN: [id number] Arrival date & time: 11/21/20  1422      History   Chief Complaint Chief Complaint  Patient presents with   Exposure to STD    HPI [name] is a 40 y.o. female.   HPI Patient presents today for treatment of chlamydia following an exposure from her spouse who presents with her during visit today. She reports no treatment following his diagnosis and feels discomfort from over productive vaginal discharge. 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 Denies dysuria or any other concerns Past Medical History:  Diagnosis Date   Back pain    Carpal tunnel syndrome    bilateral   Complication of anesthesia    "hard to wake up"   Diabetes mellitus without complication (HCC)    Dyspnea    with excersion   Family history of adverse reaction to anesthesia    mom voniting   Obesity    PONV (postoperative nausea and vomiting)    Tricuspid valve regurgitation    also mitral regurg- trace on both per echo    Patient Active Problem List   Diagnosis Date Noted   Allergic rhinitis 10/31/2019   Cough 09/27/2019   Right ear pain 09/27/2019   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   Chest pain with high risk for cardiac etiology 11/22/2015    Past Surgical History:  Procedure Laterality Date   CARDIAC CATHETERIZATION N/A 11/24/2015   Procedure: Left Heart Cath and Coronary Angiography;  Surgeon: Adrian Prows, MD;  Location: [facility];  Service: Cardiovascular;  Laterality: N/A;   CESAREAN SECTION     DILATION AND CURETTAGE OF UTERUS     x4   LUMBAR LAMINECTOMY/DECOMPRESSION MICRODISCECTOMY N/A 12/14/2016   Procedure: LUMBAR DECOMPRESSION MICRODISCECTOMY L5-S1;  Surgeon: Melina Schools, MD;  Location: [facility];  Service: Orthopedics;  Laterality: N/A;  120 mins   MASS EXCISION Right 04/01/2019   Procedure: EXCISION RIGHT ARM MASS;  Surgeon: Kinsinger, Arta Bruce, MD;  Location: [facility];  Service: General;   Laterality: Right;    OB History   No obstetric history on file.      Home Medications    Prior to Admission medications   Medication Sig Start Date End Date Taking? Authorizing Provider  aspirin 81 MG tablet Take 81 mg by mouth at bedtime.    Yes [provider]  atorvastatin (LIPITOR) 10 MG tablet Take 10 mg by mouth every evening.    Yes [provider]  Biotin 10 MG CAPS Take 20 mg by mouth every evening.   Yes [provider]  Cholecalciferol (DIALYVITE VITAMIN D 5000) 125 MCG (5000 UT) capsule Take 5,000 Units by mouth every evening.   Yes [provider]  cyclobenzaprine (FLEXERIL) 10 MG tablet Take 10 mg by mouth daily as needed for muscle spasms.  10/05/16  Yes [provider]  doxycycline (VIBRAMYCIN) 100 MG capsule Take 1 capsule (100 mg total) by mouth 2 (two) times daily for 7 days. 11/21/20 11/28/20 Yes Scot Jun, FNP  Dulaglutide (TRULICITY) 1.5 TM/5.4YT SOPN Inject 1.5 mg into the skin every Sunday.   Yes [provider]  fluconazole (DIFLUCAN) 200 MG tablet Take 200 mg by mouth every other day. In the evening 10/11/16  Yes [provider]  gabapentin (NEURONTIN) 300 MG capsule Take 1 capsule (300 mg total) by mouth 3 (three) times daily. 03/14/18  Yes Pieter Partridge, DO  Insulin Glargine (BASAGLAR KWIKPEN) 100 UNIT/ML SOPN Inject 100 Units into the skin daily with lunch.    Yes [provider]  linaclotide (LINZESS) 145 MCG CAPS capsule Take 1 capsule (145 mcg total) by mouth daily as needed. Patient taking differently: Take 145 mcg by mouth daily. 02/12/18  Yes Ladene Artist, MD  losartan (COZAAR) 25 MG tablet Take 25 mg by mouth every evening.   Yes [provider]  metFORMIN (GLUCOPHAGE-XR) 500 MG 24 hr tablet Take 2,000 mg by mouth daily with supper.   Yes [provider]  NOVOLOG 100 UNIT/ML injection Inject 4 Units into the skin. With each meal 10/09/19  Yes [provider]  valACYclovir (VALTREX) 1000 MG tablet Take 1 tablet (1,000 mg total) by mouth 3 (three) times daily. 11/21/20  Yes Scot Jun, FNP  albuterol (PROVENTIL HFA;VENTOLIN HFA) 108 (90 Base) MCG/ACT inhaler Inhale 2 puffs into the lungs every 6 (six) hours as needed for wheezing or shortness of breath.     [provider]  benzonatate (TESSALON) 200 MG capsule Take 1 capsule (200 mg total) by mouth 3 (three) times daily as needed for cough. 12/27/19   Raylene Everts, MD  Cyanocobalamin (B-12 PO) Take 1 capsule by mouth every evening.    [provider]  diphenhydrAMINE (BENADRYL) 25 MG tablet Take 50 mg by mouth daily as needed for allergies.    [provider]  fluticasone (FLONASE) 50 MCG/ACT nasal spray Place 2 sprays into both nostrils daily as needed for allergies.  11/23/16   [provider]  HYDROcodone-acetaminophen (NORCO/VICODIN) 5-325 MG tablet Take 1 tablet by mouth daily as needed (back pain).    [provider]  ibuprofen (ADVIL) 800 MG tablet Take 1 tablet (800 mg total) by mouth every 8 (eight) hours as needed. 04/01/19   Kinsinger, Arta Bruce, MD  Ibuprofen-Famotidine (DUEXIS) 800-26.6 MG TABS Take 1 tablet by mouth 3 (three) times daily.    [provider]  Melatonin 3 MG CAPS Take 3 mg by mouth at bedtime.    [provider]  methocarbamol (ROBAXIN) 500 MG tablet Take 1 tablet (500 mg total) by mouth 3 (three) times daily. Patient taking differently: Take 500 mg by mouth daily as needed (severe muscle spams).  12/15/16   Mayo, Darla Lesches, PA-C  Multiple Vitamins-Minerals (HAIR/SKIN/NAILS) CAPS Take 3 capsules by mouth every evening.    [provider]  nitroGLYCERIN (NITROSTAT) 0.4 MG SL tablet Place 0.4 mg under the tongue every 5 (five) minutes as needed for chest pain.    [provider]  oxybutynin (DITROPAN-XL) 5 MG 24 hr tablet Take 5 mg by mouth daily.    [provider]  polyethylene glycol (MIRALAX / GLYCOLAX) 17 g packet Take 17 g by mouth daily.    [provider]    Family History Family History  Problem Relation Age of Onset   Cancer Father    Diabetes Father    Heart disease Father    Hypertension Father    Cancer Maternal Grandmother    Diabetes Maternal Grandfather    Cancer Paternal Grandmother    Diabetes Paternal Grandfather     Social History Social History   Tobacco Use   Smoking status: Never   Smokeless tobacco: Never  Vaping Use   Vaping Use: Never used  Substance Use Topics   Alcohol use: No   Drug use: No     Allergies   Latex  Review of Systems Review of Systems Pertinent negatives listed in HPI  Physical Exam Triage Vital Signs ED Triage Vitals  Enc Vitals Group     BP 11/21/20 1553 122/79     Pulse Rate 11/21/20 1553 77     Resp 11/21/20 1553 16     Temp 11/21/20 1553 98 F (36.7 C)     Temp Source 11/21/20 1553 Oral     SpO2 11/21/20 1553 98 %     Weight --      Height --      Head Circumference --      Peak Flow --      Pain Score 11/21/20 1550 2     Pain Loc --      Pain Edu? --      Excl. in [place]? --    No data found.  Updated Vital Signs BP 122/79   Pulse 77   Temp 98 F (36.7 C) (Oral)   Resp 16   LMP 11/12/2020   SpO2 98%   Visual Acuity Right Eye Distance:   Left Eye Distance:   Bilateral Distance:    Right Eye Near:   Left Eye Near:    Bilateral Near:     Physical Exam General appearance: Alert, obese, cooperative , no distress Head: Normocephalic, without obvious abnormality, atraumatic Respiratory: Respirations even and unlabored, normal respiratory rate Heart: Rate and rhythm normal. Skin: Skin color, texture, turgor normal. No rashes seen  Psych: Appropriate mood and affect. Neurologic: GCS 15,  normal coordination, normal gait  UC Treatments / Results  Labs (all labs ordered are listed, but only abnormal results are displayed) Labs  Reviewed  CERVICOVAGINAL ANCILLARY ONLY - Abnormal; Notable for the following components:      Result Value   Bacterial Vaginitis (gardnerella) Positive (*)    Candida Glabrata Positive (*)    All other components within normal limits    EKG   Radiology No results found.  Procedures Procedures (including critical care time)  Medications Ordered in UC Medications - No data to display  Initial Impression / Assessment and Plan / UC Course  I have reviewed the triage vital signs and the nursing notes.  Pertinent labs & imaging results that were available during my care of the patient were reviewed by me and considered in my medical decision making (see chart for details).      Vaginal cytology pending. Given known exposure to Chlamydia, treating with Doxycyline. Refilled Valtrex for HSV exacerbation. Follow-up with PCP as needed. Final Clinical Impressions(s) / UC Diagnoses   Final diagnoses:  Exposure to sexually transmitted disease (STD)  Herpes infection   Discharge Instructions   None    ED Prescriptions     Medication Sig Dispense Auth. Provider   valACYclovir (VALTREX) 1000 MG tablet Take 1 tablet (1,000 mg total) by mouth 3 (three) times daily. 60 tablet Scot Jun, FNP   doxycycline (VIBRAMYCIN) 100 MG capsule Take 1 capsule (100 mg total) by mouth 2 (two) times daily for 7 days. 14 capsule Scot Jun, FNP      PDMP not reviewed this encounter.   Scot Jun, FNP 11/24/20 [phone]

## 2020-11-21 NOTE — ED Triage Notes (Signed)
PT reports vaginal discharge and odor

## 2020-11-21 NOTE — ED Triage Notes (Signed)
Requests STD testing, partner was treated for chlamydia and they had sex the following day.   Believes she is about to break out with HSV, would like treatment. Has a previous diagnosis.

## 2020-11-23 LAB — CERVICOVAGINAL ANCILLARY ONLY
Bacterial Vaginitis (gardnerella): POSITIVE — AB
Candida Glabrata: POSITIVE — AB
Candida Vaginitis: NEGATIVE
Chlamydia: NEGATIVE
Comment: NEGATIVE
Comment: NEGATIVE
Comment: NEGATIVE
Comment: NEGATIVE
Comment: NEGATIVE
Comment: NORMAL
Neisseria Gonorrhea: NEGATIVE
Trichomonas: NEGATIVE

## 2020-11-24 ENCOUNTER — Telehealth: Payer: Self-pay | Admitting: *Deleted

## 2020-11-24 MED ORDER — METRONIDAZOLE 500 MG PO TABS: 500.0000 mg | ORAL_TABLET | Freq: Two times a day (BID) | ORAL | 0 refills | Status: AC

## 2020-11-24 MED ORDER — FLUCONAZOLE 150 MG PO TABS: 150.0000 mg | ORAL_TABLET | Freq: Every day | ORAL | 0 refills | Status: AC

## 2020-11-24 NOTE — Telephone Encounter (Signed)
STD negative, but had positive exposure.    Bacterial vaginosis is positive. Flagyl 500 mg BID x 7 days #14 no refills sent to patients pharmacy of choice.     Test for candida (yeast) was positive.  Prescription for fluconazole 150mg  po now, repeat dose in 3d if needed, #2 no refills, sent to the pharmacy of record.  Recheck or followup with PCP for further evaluation if symptoms arenot improving.

## 2020-11-30 DIAGNOSIS — M792 Neuralgia and neuritis, unspecified: Secondary | ICD-10-CM | POA: Diagnosis not present

## 2020-11-30 DIAGNOSIS — G5761 Lesion of plantar nerve, right lower limb: Secondary | ICD-10-CM | POA: Diagnosis not present

## 2020-12-02 DIAGNOSIS — S60012A Contusion of left thumb without damage to nail, initial encounter: Secondary | ICD-10-CM | POA: Diagnosis not present

## 2020-12-02 DIAGNOSIS — L905 Scar conditions and fibrosis of skin: Secondary | ICD-10-CM | POA: Diagnosis not present

## 2020-12-02 DIAGNOSIS — Z9889 Other specified postprocedural states: Secondary | ICD-10-CM | POA: Diagnosis not present

## 2020-12-02 DIAGNOSIS — R234 Changes in skin texture: Secondary | ICD-10-CM | POA: Diagnosis not present

## 2020-12-11 DIAGNOSIS — L723 Sebaceous cyst: Secondary | ICD-10-CM | POA: Diagnosis not present

## 2020-12-11 DIAGNOSIS — Z113 Encounter for screening for infections with a predominantly sexual mode of transmission: Secondary | ICD-10-CM | POA: Diagnosis not present

## 2020-12-11 DIAGNOSIS — N760 Acute vaginitis: Secondary | ICD-10-CM | POA: Diagnosis not present

## 2020-12-22 DIAGNOSIS — Z794 Long term (current) use of insulin: Secondary | ICD-10-CM | POA: Diagnosis not present

## 2020-12-22 DIAGNOSIS — E1165 Type 2 diabetes mellitus with hyperglycemia: Secondary | ICD-10-CM | POA: Diagnosis not present

## 2020-12-22 DIAGNOSIS — E669 Obesity, unspecified: Secondary | ICD-10-CM | POA: Diagnosis not present

## 2020-12-28 ENCOUNTER — Ambulatory Visit
Admission: EM | Admit: 2020-12-28 | Discharge: 2020-12-28 | Disposition: A | Discharge status: Home / Self Care | Payer: Federal, State, Local not specified - PPO

## 2020-12-28 ENCOUNTER — Encounter: Payer: Self-pay | Admitting: Emergency Medicine

## 2020-12-28 ENCOUNTER — Other Ambulatory Visit: Payer: Self-pay

## 2020-12-28 ENCOUNTER — Ambulatory Visit: Payer: Self-pay

## 2020-12-28 DIAGNOSIS — J029 Acute pharyngitis, unspecified: Secondary | ICD-10-CM

## 2020-12-28 DIAGNOSIS — H6981 Other specified disorders of Eustachian tube, right ear: Secondary | ICD-10-CM | POA: Diagnosis not present

## 2020-12-28 NOTE — ED Provider Notes (Signed)
[facility]    CSN: [id number] Arrival date & time: 12/28/20  1440      History   Chief Complaint Chief Complaint  Patient presents with  . Appointment    1500  . Otalgia    HPI [name] is a 40 y.o. female.   Patient presents with right ear pain and sore throat painful swallowing beginning this morning.  Tolerating food and liquids.  Denies fever, chills, body aches, headaches, nasal congestion, rhinorrhea, cough, shortness of breath, wheezing, chest pain or tightness, abdominal pain, nausea, vomiting, diarrhea.  Has not attempted treatment.   Past Medical History:  Diagnosis Date  . Back pain   . Carpal tunnel syndrome    bilateral  . Complication of anesthesia    "hard to wake up"  . Diabetes mellitus without complication ([place])   . Dyspnea    with excersion  . Family history of adverse reaction to anesthesia    mom voniting  . Obesity   . PONV (postoperative nausea and vomiting)   . Tricuspid valve regurgitation    also mitral regurg- trace on both per echo    Patient Active Problem List   Diagnosis Date Noted  . Allergic rhinitis 10/31/2019  . Cough 09/27/2019  . Right ear pain 09/27/2019  . 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  . Chest pain with high risk for cardiac etiology 11/22/2015    Past Surgical History:  Procedure Laterality Date  . CARDIAC CATHETERIZATION N/A 11/24/2015   Procedure: Left Heart Cath and Coronary Angiography;  Surgeon: Adrian Prows, MD;  Location: [facility];  Service: Cardiovascular;  Laterality: N/A;  . CESAREAN SECTION    . DILATION AND CURETTAGE OF UTERUS     x4  . LUMBAR LAMINECTOMY/DECOMPRESSION MICRODISCECTOMY N/A 12/14/2016   Procedure: LUMBAR DECOMPRESSION MICRODISCECTOMY L5-S1;  Surgeon: Melina Schools, MD;  Location: [facility];  Service: Orthopedics;  Laterality: N/A;  120 mins  . MASS EXCISION Right 04/01/2019   Procedure: EXCISION RIGHT ARM MASS;  Surgeon: Kinsinger, Arta Bruce, MD;  Location: [facility];  Service: General;  Laterality: Right;    OB History   No obstetric history on file.      Home Medications    Prior to Admission medications   Medication Sig Start Date End Date Taking? Authorizing Provider  albuterol (PROVENTIL HFA;VENTOLIN HFA) 108 (90 Base) MCG/ACT inhaler Inhale 2 puffs into the lungs every 6 (six) hours as needed for wheezing or shortness of breath.     [provider]  aspirin 81 MG tablet Take 81 mg by mouth at bedtime.     [provider]  atorvastatin (LIPITOR) 10 MG tablet Take 10 mg by mouth every evening.     [provider]  benzonatate (TESSALON) 200 MG capsule Take 1 capsule (200 mg total) by mouth 3 (three) times daily as needed for cough. Patient not taking: Reported on 12/28/2020 12/27/19   Raylene Everts, MD  Biotin 10 MG CAPS Take 20 mg by mouth every evening.    [provider]  Cholecalciferol (DIALYVITE VITAMIN D 5000) 125 MCG (5000 UT) capsule Take 5,000 Units by mouth every evening.    [provider]  Cyanocobalamin (B-12 PO) Take 1 capsule by mouth every evening.    [provider]  cyclobenzaprine (FLEXERIL) 10 MG tablet Take 10 mg by mouth daily as needed for muscle spasms.  10/05/16   [provider]  diphenhydrAMINE (BENADRYL) 25 MG tablet Take 50 mg by  mouth daily as needed for allergies.    [provider]  Dulaglutide (TRULICITY) 1.5 0000000 SOPN Inject 1.5 mg into the skin every Sunday.    [provider]  fluconazole (DIFLUCAN) 200 MG tablet Take 200 mg by mouth every other day. In the evening 10/11/16   [provider]  fluticasone (FLONASE) 50 MCG/ACT nasal spray Place 2 sprays into both nostrils daily as needed for allergies.  11/23/16   [provider]  gabapentin (NEURONTIN) 300 MG capsule Take 1 capsule (300 mg total) by mouth 3 (three) times daily. 03/14/18   Pieter Partridge, DO  HYDROcodone-acetaminophen (NORCO/VICODIN) 5-325 MG  tablet Take 1 tablet by mouth daily as needed (back pain).    [provider]  ibuprofen (ADVIL) 800 MG tablet Take 1 tablet (800 mg total) by mouth every 8 (eight) hours as needed. 04/01/19   Kinsinger, Arta Bruce, MD  Ibuprofen-Famotidine (DUEXIS) 800-26.6 MG TABS Take 1 tablet by mouth 3 (three) times daily.    [provider]  Insulin Glargine (BASAGLAR KWIKPEN) 100 UNIT/ML SOPN Inject 100 Units into the skin daily with lunch.     [provider]  linaclotide (LINZESS) 145 MCG CAPS capsule Take 1 capsule (145 mcg total) by mouth daily as needed. Patient taking differently: Take 145 mcg by mouth daily. 02/12/18   Ladene Artist, MD  losartan (COZAAR) 25 MG tablet Take 25 mg by mouth every evening.    [provider]  Melatonin 3 MG CAPS Take 3 mg by mouth at bedtime.    [provider]  metFORMIN (GLUCOPHAGE-XR) 500 MG 24 hr tablet Take 2,000 mg by mouth daily with supper.    [provider]  methocarbamol (ROBAXIN) 500 MG tablet Take 1 tablet (500 mg total) by mouth 3 (three) times daily. Patient taking differently: Take 500 mg by mouth daily as needed (severe muscle spams).  12/15/16   Mayo, Darla Lesches, PA-C  Multiple Vitamins-Minerals (HAIR/SKIN/NAILS) CAPS Take 3 capsules by mouth every evening.    [provider]  nitroGLYCERIN (NITROSTAT) 0.4 MG SL tablet Place 0.4 mg under the tongue every 5 (five) minutes as needed for chest pain.    [provider]  NOVOLOG 100 UNIT/ML injection Inject 4 Units into the skin. With each meal 10/09/19   [provider]  oxybutynin (DITROPAN-XL) 5 MG 24 hr tablet Take 5 mg by mouth daily.    [provider]  polyethylene glycol (MIRALAX / GLYCOLAX) 17 g packet Take 17 g by mouth daily.    [provider]  valACYclovir (VALTREX) 1000 MG tablet Take 1 tablet (1,000 mg total) by mouth 3 (three) times daily. 11/21/20   Scot Jun, FNP    Family  History Family History  Problem Relation Age of Onset  . Cancer Father   . Diabetes Father   . Heart disease Father   . Hypertension Father   . Cancer Maternal Grandmother   . Diabetes Maternal Grandfather   . Cancer Paternal Grandmother   . Diabetes Paternal Grandfather     Social History Social History   Tobacco Use  . Smoking status: Never  . Smokeless tobacco: Never  Vaping Use  . Vaping Use: Never used  Substance Use Topics  . Alcohol use: No  . Drug use: No     Allergies   Latex   Review of Systems Review of Systems Defer to HPI    Physical Exam Triage Vital Signs ED Triage Vitals  Enc  Vitals Group     BP 12/28/20 1451 128/81     Pulse Rate 12/28/20 1451 84     Resp 12/28/20 1451 18     Temp 12/28/20 1451 98.7 F (37.1 C)     Temp Source 12/28/20 1451 Oral     SpO2 12/28/20 1451 96 %     Weight --      Height --      Head Circumference --      Peak Flow --      Pain Score 12/28/20 1452 3     Pain Loc --      Pain Edu? --      Excl. in [place]? --    No data found.  Updated Vital Signs BP 128/81 (BP Location: Left Arm)   Pulse 84   Temp 98.7 F (37.1 C) (Oral)   Resp 18   SpO2 96%   Visual Acuity Right Eye Distance:   Left Eye Distance:   Bilateral Distance:    Right Eye Near:   Left Eye Near:    Bilateral Near:     Physical Exam Constitutional:      Appearance: Normal appearance. She is normal weight.  HENT:     Head: Normocephalic.     Right Ear: Hearing, ear canal and external ear normal. A middle ear effusion is present.     Left Ear: Ear canal and external ear normal. A middle ear effusion is present.     Nose: Nose normal.     Mouth/Throat:     Mouth: Mucous membranes are moist.     Pharynx: Oropharynx is clear.  Eyes:     Extraocular Movements: Extraocular movements intact.  Cardiovascular:     Rate and Rhythm: Normal rate and regular rhythm.     Pulses: Normal pulses.     Heart sounds: Normal heart sounds.  Pulmonary:      Effort: Pulmonary effort is normal.  Musculoskeletal:     Cervical back: Normal range of motion.  Lymphadenopathy:     Cervical: Cervical adenopathy present.  Skin:    General: Skin is warm and dry.  Neurological:     Mental Status: She is alert and oriented to person, place, and time. Mental status is at baseline.  Psychiatric:        Mood and Affect: Mood normal.        Behavior: Behavior normal.     UC Treatments / Results  Labs (all labs ordered are listed, but only abnormal results are displayed) Labs Reviewed - No data to display  EKG   Radiology No results found.  Procedures Procedures (including critical care time)  Medications Ordered in UC Medications - No data to display  Initial Impression / Assessment and Plan / UC Course  I have reviewed the triage vital signs and the nursing notes.  Pertinent labs & imaging results that were available during my care of the patient were reviewed by me and considered in my medical decision making (see chart for details).  Right eustachian tube dysfunction Sore throat  1.  Advised patient to restart use of Flonase and antihistamine medication to help treat symptoms 2.  Given follow-up precautions for worsening symptoms for reevaluation in urgent care at any point Final Clinical Impressions(s) / UC Diagnoses   Final diagnoses:  Eustachian tube dysfunction, right  Sore throat     Discharge Instructions      Exam today did not show signs of infection however there is some  fluid sitting behind your ears  Start using your Flonase daily as prescribed to help clear some of the fluid behind your ears, if you have been allergy medication that she already use please restart  If you do not have allergy medicine already in use, you may use over-the-counter Claritin or Zyrtec daily before bed to help provide some relief  If your symptoms begin to worsen you may follow-up in urgent care for reevaluation   ED  Prescriptions   None    PDMP not reviewed this encounter.   Hans Eden, NP 12/28/20 1510

## 2020-12-28 NOTE — Discharge Instructions (Addendum)
Exam today did not show signs of infection however there is some fluid sitting behind your ears  Start using your Flonase daily as prescribed to help clear some of the fluid behind your ears, if you have been allergy medication that she already use please restart  If you do not have allergy medicine already in use, you may use over-the-counter Claritin or Zyrtec daily before bed to help provide some relief  If your symptoms begin to worsen you may follow-up in urgent care for reevaluation

## 2020-12-28 NOTE — ED Triage Notes (Signed)
Pt sts right ear pain and sore throat starting this am

## 2021-01-22 DIAGNOSIS — E669 Obesity, unspecified: Secondary | ICD-10-CM | POA: Diagnosis not present

## 2021-01-22 DIAGNOSIS — E1165 Type 2 diabetes mellitus with hyperglycemia: Secondary | ICD-10-CM | POA: Diagnosis not present

## 2021-01-22 DIAGNOSIS — Z794 Long term (current) use of insulin: Secondary | ICD-10-CM | POA: Diagnosis not present

## 2021-02-04 DIAGNOSIS — A609 Anogenital herpesviral infection, unspecified: Secondary | ICD-10-CM | POA: Diagnosis not present

## 2021-02-04 DIAGNOSIS — Z01419 Encounter for gynecological examination (general) (routine) without abnormal findings: Secondary | ICD-10-CM | POA: Diagnosis not present

## 2021-02-04 DIAGNOSIS — Z113 Encounter for screening for infections with a predominantly sexual mode of transmission: Secondary | ICD-10-CM | POA: Diagnosis not present

## 2021-02-04 DIAGNOSIS — B373 Candidiasis of vulva and vagina: Secondary | ICD-10-CM | POA: Diagnosis not present

## 2021-02-10 DIAGNOSIS — Z1231 Encounter for screening mammogram for malignant neoplasm of breast: Secondary | ICD-10-CM | POA: Diagnosis not present

## 2021-02-10 DIAGNOSIS — Z7689 Persons encountering health services in other specified circumstances: Secondary | ICD-10-CM | POA: Diagnosis not present

## 2021-02-18 DIAGNOSIS — N979 Female infertility, unspecified: Secondary | ICD-10-CM | POA: Diagnosis not present

## 2021-02-18 DIAGNOSIS — Z319 Encounter for procreative management, unspecified: Secondary | ICD-10-CM | POA: Diagnosis not present

## 2021-02-19 DIAGNOSIS — G5761 Lesion of plantar nerve, right lower limb: Secondary | ICD-10-CM | POA: Diagnosis not present

## 2021-02-19 DIAGNOSIS — M79671 Pain in right foot: Secondary | ICD-10-CM | POA: Diagnosis not present

## 2021-03-11 DIAGNOSIS — M79671 Pain in right foot: Secondary | ICD-10-CM | POA: Diagnosis not present

## 2021-03-19 DIAGNOSIS — G5761 Lesion of plantar nerve, right lower limb: Secondary | ICD-10-CM | POA: Diagnosis not present

## 2021-03-24 DIAGNOSIS — H6691 Otitis media, unspecified, right ear: Secondary | ICD-10-CM | POA: Diagnosis not present

## 2021-04-07 DIAGNOSIS — Z01818 Encounter for other preprocedural examination: Secondary | ICD-10-CM | POA: Diagnosis not present

## 2021-04-07 DIAGNOSIS — R9431 Abnormal electrocardiogram [ECG] [EKG]: Secondary | ICD-10-CM | POA: Diagnosis not present

## 2021-04-08 ENCOUNTER — Other Ambulatory Visit: Payer: Self-pay | Admitting: Otolaryngology

## 2021-04-08 DIAGNOSIS — G5761 Lesion of plantar nerve, right lower limb: Secondary | ICD-10-CM | POA: Diagnosis not present

## 2021-04-12 DIAGNOSIS — E113293 Type 2 diabetes mellitus with mild nonproliferative diabetic retinopathy without macular edema, bilateral: Secondary | ICD-10-CM | POA: Diagnosis not present

## 2021-06-01 ENCOUNTER — Ambulatory Visit
Admission: RE | Admit: 2021-06-01 | Discharge: 2021-06-01 | Disposition: A | Discharge status: Home / Self Care | Payer: Federal, State, Local not specified - PPO | Source: Ambulatory Visit | Attending: Internal Medicine | Admitting: Internal Medicine

## 2021-06-01 ENCOUNTER — Other Ambulatory Visit: Payer: Self-pay

## 2021-06-01 VITALS — BP 155/89 | HR 90 | Temp 98.4°F | Resp 18

## 2021-06-01 DIAGNOSIS — N898 Other specified noninflammatory disorders of vagina: Secondary | ICD-10-CM | POA: Diagnosis not present

## 2021-06-01 DIAGNOSIS — Z113 Encounter for screening for infections with a predominantly sexual mode of transmission: Secondary | ICD-10-CM | POA: Diagnosis not present

## 2021-06-01 LAB — POCT URINALYSIS DIP (MANUAL ENTRY)
Bilirubin, UA: NEGATIVE
Blood, UA: NEGATIVE
Glucose, UA: 500 mg/dL — AB
Leukocytes, UA: NEGATIVE
Nitrite, UA: NEGATIVE
Protein Ur, POC: NEGATIVE mg/dL
Spec Grav, UA: 1.01 (ref 1.010–1.025)
Urobilinogen, UA: 0.2 E.U./dL
pH, UA: 6.5 (ref 5.0–8.0)

## 2021-06-01 LAB — POCT URINE PREGNANCY: Preg Test, Ur: NEGATIVE

## 2021-06-01 MED ORDER — FLUCONAZOLE 150 MG PO TABS: 150.0000 mg | ORAL_TABLET | ORAL | 0 refills | Status: DC

## 2021-06-01 MED ORDER — FLUCONAZOLE 150 MG PO TABS
150.0000 mg | ORAL_TABLET | ORAL | 0 refills | Status: DC
Start: 2021-06-01 — End: 2022-11-08

## 2021-06-01 NOTE — ED Triage Notes (Signed)
Pt here for dysuria and vaginal discharge x 2 days

## 2021-06-01 NOTE — ED Provider Notes (Signed)
[facility]    CSN: [id number] Arrival date & time: 06/01/21  1800      History   Chief Complaint Chief Complaint  Patient presents with   Appointment    1800   Dysuria   Vaginal Discharge    HPI Louisiana Guarino is a 41 y.o. female.   Patient presents with mild urinary burning and yellow to white vaginal discharge that has been present for 2 days.  Denies urinary frequency, pelvic pain, abdominal pain, fever, back pain, hematuria, irregular vaginal bleeding.  Last menstrual cycle was 05/18/2021.  Denies any known exposure to STD but has had unprotected sexual intercourse recently.  She does want vaginal swab STD testing.   Dysuria Vaginal Discharge  Past Medical History:  Diagnosis Date   Diabetes mellitus without complication ([place])     There are no problems to display for this patient.   History reviewed. No pertinent surgical history.  OB History   No obstetric history on file.      Home Medications    Prior to Admission medications   Medication Sig Start Date End Date Taking? Authorizing Provider  insulin regular (NOVOLIN R) 100 units/mL injection Inject 130 Units into the skin 3 (three) times daily before meals.   Yes [provider]  fluconazole (DIFLUCAN) 150 MG tablet Take 1 tablet (150 mg total) by mouth every 3 (three) days. Take first pill today.  May take second pill in 3 days if no resolution of symptoms with first pill. 06/01/21   Teodora Medici, FNP    Family History History reviewed. No pertinent family history.  Social History Social History   Tobacco Use   Smoking status: Never   Smokeless tobacco: Never  Substance Use Topics   Alcohol use: Never   Drug use: Never     Allergies   Latex   Review of Systems Review of Systems Per HPI  Physical Exam Triage Vital Signs ED Triage Vitals  Enc Vitals Group     BP 06/01/21 1821 (!) 155/89     Pulse Rate 06/01/21 1821 90     Resp 06/01/21 1821 18     Temp 06/01/21  1821 98.4 F (36.9 C)     Temp Source 06/01/21 1821 Oral     SpO2 06/01/21 1821 97 %     Weight --      Height --      Head Circumference --      Peak Flow --      Pain Score 06/01/21 1822 4     Pain Loc --      Pain Edu? --      Excl. in [place]? --    No data found.  Updated Vital Signs BP (!) 155/89 (BP Location: Left Arm)    Pulse 90    Temp 98.4 F (36.9 C) (Oral)    Resp 18    SpO2 97%   Visual Acuity Right Eye Distance:   Left Eye Distance:   Bilateral Distance:    Right Eye Near:   Left Eye Near:    Bilateral Near:     Physical Exam Constitutional:      General: She is not in acute distress.    Appearance: Normal appearance. She is not toxic-appearing or diaphoretic.  HENT:     Head: Normocephalic and atraumatic.  Eyes:     Extraocular Movements: Extraocular movements intact.     Conjunctiva/sclera: Conjunctivae normal.  Pulmonary:     Effort: Pulmonary  effort is normal.  Genitourinary:    Comments: Deferred with shared decision making.  Self swab performed. Neurological:     General: No focal deficit present.     Mental Status: She is alert and oriented to person, place, and time. Mental status is at baseline.  Psychiatric:        Mood and Affect: Mood normal.        Behavior: Behavior normal.        Thought Content: Thought content normal.        Judgment: Judgment normal.     UC Treatments / Results  Labs (all labs ordered are listed, but only abnormal results are displayed) Labs Reviewed  POCT URINALYSIS DIP (MANUAL ENTRY) - Abnormal; Notable for the following components:      Result Value   Glucose, UA =500 (*)    Ketones, POC UA small (15) (*)    All other components within normal limits  POCT URINE PREGNANCY  CERVICOVAGINAL ANCILLARY ONLY    EKG   Radiology No results found.  Procedures Procedures (including critical care time)  Medications Ordered in UC Medications - No data to display  Initial Impression / Assessment and Plan /  UC Course  I have reviewed the triage vital signs and the nursing notes.  Pertinent labs & imaging results that were available during my care of the patient were reviewed by me and considered in my medical decision making (see chart for details).     There is high suspicion for vaginal yeast infection given the patient has type 2 diabetes and associated vaginal discharge.  Will treat with Diflucan.  Patient has glucose on urinalysis.  She reports that her last known blood sugar was in the 200s.  She has uncontrolled blood sugar and average ranges in the 400s.  She currently takes insulin for this.  Offered blood glucose test today but patient declined stating that she would take it when she gets home.  Vaginal swab pending.  Urinalysis does not indicate urinary tract infection.  Do not think that urine culture is needed.  Discussed return precautions.  Patient verbalized understanding and was agreeable with plan. Final Clinical Impressions(s) / UC Diagnoses   Final diagnoses:  Vaginal discharge  Screening examination for venereal disease     Discharge Instructions      Highly suspicious of yeast infection.  You are being treated with Diflucan.  Vaginal swab is pending.  We will call if it is positive and treat as appropriate.  Please refrain from sexual activity until test results and treatment are complete.    ED Prescriptions     Medication Sig Dispense Auth. Provider   fluconazole (DIFLUCAN) 150 MG tablet  (Status: Discontinued) Take 1 tablet (150 mg total) by mouth every 3 (three) days. Take first pill today.  May take second pill in 3 days if no resolution of symptoms with first pill. 2 tablet [place], Hildred Alamin E, [place]   fluconazole (DIFLUCAN) 150 MG tablet  (Status: Discontinued) Take 1 tablet (150 mg total) by mouth every 3 (three) days. Take first pill today.  May take second pill in 3 days if no resolution of symptoms with first pill. 2 tablet [place], [place] E, [place]   fluconazole  (DIFLUCAN) 150 MG tablet Take 1 tablet (150 mg total) by mouth every 3 (three) days. Take first pill today.  May take second pill in 3 days if no resolution of symptoms with first pill. 2 tablet [place], Michele Rockers, [place]  PDMP not reviewed this encounter.   Teodora Medici, [place] 06/01/21 [id number]

## 2021-06-01 NOTE — Discharge Instructions (Signed)
Highly suspicious of yeast infection.  You are being treated with Diflucan.  Vaginal swab is pending.  We will call if it is positive and treat as appropriate.  Please refrain from sexual activity until test results and treatment are complete.

## 2021-06-02 ENCOUNTER — Encounter: Payer: Self-pay | Admitting: Emergency Medicine

## 2021-06-02 LAB — CERVICOVAGINAL ANCILLARY ONLY
Bacterial Vaginitis (gardnerella): NEGATIVE
Candida Glabrata: POSITIVE — AB
Candida Vaginitis: NEGATIVE
Chlamydia: NEGATIVE
Comment: NEGATIVE
Comment: NEGATIVE
Comment: NEGATIVE
Comment: NEGATIVE
Comment: NEGATIVE
Comment: NORMAL
Neisseria Gonorrhea: NEGATIVE
Trichomonas: NEGATIVE

## 2021-06-25 ENCOUNTER — Ambulatory Visit: Payer: Self-pay

## 2021-07-12 ENCOUNTER — Ambulatory Visit (HOSPITAL_COMMUNITY)
Admission: RE | Admit: 2021-07-12 | Discharge: 2021-07-12 | Disposition: A | Discharge status: Home / Self Care | Payer: Federal, State, Local not specified - PPO | Source: Ambulatory Visit | Attending: Orthopaedic Surgery | Admitting: Orthopaedic Surgery

## 2021-07-12 ENCOUNTER — Other Ambulatory Visit (HOSPITAL_COMMUNITY): Payer: Self-pay | Admitting: Orthopaedic Surgery

## 2021-07-12 ENCOUNTER — Other Ambulatory Visit: Payer: Self-pay

## 2021-07-12 DIAGNOSIS — R609 Edema, unspecified: Secondary | ICD-10-CM

## 2021-07-12 DIAGNOSIS — M545 Low back pain, unspecified: Secondary | ICD-10-CM | POA: Diagnosis not present

## 2021-07-26 DIAGNOSIS — M79671 Pain in right foot: Secondary | ICD-10-CM | POA: Diagnosis not present

## 2021-08-10 DIAGNOSIS — L600 Ingrowing nail: Secondary | ICD-10-CM | POA: Diagnosis not present

## 2021-08-24 DIAGNOSIS — L97521 Non-pressure chronic ulcer of other part of left foot limited to breakdown of skin: Secondary | ICD-10-CM | POA: Diagnosis not present

## 2021-09-16 DIAGNOSIS — R309 Painful micturition, unspecified: Secondary | ICD-10-CM | POA: Diagnosis not present

## 2021-09-16 DIAGNOSIS — Z113 Encounter for screening for infections with a predominantly sexual mode of transmission: Secondary | ICD-10-CM | POA: Diagnosis not present

## 2021-09-16 DIAGNOSIS — N760 Acute vaginitis: Secondary | ICD-10-CM | POA: Diagnosis not present

## 2021-09-16 DIAGNOSIS — N898 Other specified noninflammatory disorders of vagina: Secondary | ICD-10-CM | POA: Diagnosis not present

## 2021-10-07 DIAGNOSIS — Z87828 Personal history of other (healed) physical injury and trauma: Secondary | ICD-10-CM | POA: Diagnosis not present

## 2021-10-19 ENCOUNTER — Ambulatory Visit: Payer: Self-pay

## 2021-10-19 DIAGNOSIS — N760 Acute vaginitis: Secondary | ICD-10-CM | POA: Diagnosis not present

## 2021-10-19 DIAGNOSIS — Z113 Encounter for screening for infections with a predominantly sexual mode of transmission: Secondary | ICD-10-CM | POA: Diagnosis not present

## 2021-10-19 DIAGNOSIS — Z3202 Encounter for pregnancy test, result negative: Secondary | ICD-10-CM | POA: Diagnosis not present

## 2021-10-19 DIAGNOSIS — Z309 Encounter for contraceptive management, unspecified: Secondary | ICD-10-CM | POA: Diagnosis not present

## 2021-10-20 DIAGNOSIS — A5400 Gonococcal infection of lower genitourinary tract, unspecified: Secondary | ICD-10-CM | POA: Diagnosis not present

## 2021-10-28 ENCOUNTER — Ambulatory Visit: Payer: Self-pay

## 2021-10-28 ENCOUNTER — Ambulatory Visit: Payer: Federal, State, Local not specified - PPO | Admitting: Orthopedic Surgery

## 2021-10-28 ENCOUNTER — Ambulatory Visit (INDEPENDENT_AMBULATORY_CARE_PROVIDER_SITE_OTHER): Payer: Federal, State, Local not specified - PPO

## 2021-10-28 DIAGNOSIS — M6701 Short Achilles tendon (acquired), right ankle: Secondary | ICD-10-CM

## 2021-10-28 DIAGNOSIS — M6702 Short Achilles tendon (acquired), left ankle: Secondary | ICD-10-CM | POA: Diagnosis not present

## 2021-10-28 DIAGNOSIS — M79672 Pain in left foot: Secondary | ICD-10-CM | POA: Diagnosis not present

## 2021-10-28 DIAGNOSIS — M79671 Pain in right foot: Secondary | ICD-10-CM

## 2021-10-29 ENCOUNTER — Encounter: Payer: Self-pay | Admitting: Orthopedic Surgery

## 2021-11-25 DIAGNOSIS — A609 Anogenital herpesviral infection, unspecified: Secondary | ICD-10-CM | POA: Diagnosis not present

## 2021-11-25 DIAGNOSIS — Z113 Encounter for screening for infections with a predominantly sexual mode of transmission: Secondary | ICD-10-CM | POA: Diagnosis not present

## 2021-12-13 DIAGNOSIS — L308 Other specified dermatitis: Secondary | ICD-10-CM | POA: Diagnosis not present

## 2022-01-07 DIAGNOSIS — F321 Major depressive disorder, single episode, moderate: Secondary | ICD-10-CM | POA: Diagnosis not present

## 2022-01-07 DIAGNOSIS — L989 Disorder of the skin and subcutaneous tissue, unspecified: Secondary | ICD-10-CM | POA: Diagnosis not present

## 2022-01-07 DIAGNOSIS — E782 Mixed hyperlipidemia: Secondary | ICD-10-CM | POA: Diagnosis not present

## 2022-01-07 DIAGNOSIS — M545 Low back pain, unspecified: Secondary | ICD-10-CM | POA: Diagnosis not present

## 2022-01-13 DIAGNOSIS — D485 Neoplasm of uncertain behavior of skin: Secondary | ICD-10-CM | POA: Diagnosis not present

## 2022-01-26 DIAGNOSIS — Z794 Long term (current) use of insulin: Secondary | ICD-10-CM | POA: Diagnosis not present

## 2022-01-26 DIAGNOSIS — E669 Obesity, unspecified: Secondary | ICD-10-CM | POA: Diagnosis not present

## 2022-01-26 DIAGNOSIS — E1165 Type 2 diabetes mellitus with hyperglycemia: Secondary | ICD-10-CM | POA: Diagnosis not present

## 2022-02-05 ENCOUNTER — Ambulatory Visit
Admission: RE | Admit: 2022-02-05 | Discharge: 2022-02-05 | Disposition: A | Discharge status: Home / Self Care | Payer: Federal, State, Local not specified - PPO | Source: Ambulatory Visit | Attending: Physician Assistant | Admitting: Physician Assistant

## 2022-02-05 VITALS — BP 131/84 | HR 80 | Temp 98.2°F | Resp 18

## 2022-02-05 DIAGNOSIS — M5441 Lumbago with sciatica, right side: Secondary | ICD-10-CM

## 2022-02-05 MED ORDER — CYCLOBENZAPRINE HCL 10 MG PO TABS: 10.0000 mg | ORAL_TABLET | Freq: Two times a day (BID) | ORAL | 0 refills | Status: DC | PRN

## 2022-02-05 MED ORDER — NAPROXEN 500 MG PO TABS: 500.0000 mg | ORAL_TABLET | Freq: Two times a day (BID) | ORAL | 0 refills | Status: AC

## 2022-02-05 NOTE — ED Triage Notes (Signed)
Pt presents with back pain X 3 days after doing a lot of lifting & moving at work.

## 2022-02-05 NOTE — ED Provider Notes (Signed)
[facility]    CSN: [id number] Arrival date & time: 02/05/22  1256      History   Chief Complaint Chief Complaint  Patient presents with   Back Pain    Hurt @ work - Entered by patient    HPI [name] is a 41 y.o. female.   Patient here today for evaluation of back pain she has had for 3 days.  She reports that she was at work and did a lot of lifting that she is not used to.  She states that since that time she has developed pain in her right low back that does radiate down her leg at times.  She has known neuropathy due to diabetes but states that she has had more numbness and tingling in her right foot.  She denies any issues with bowel or bladder function.  She has been taking pain medicine she had leftover from surgery with minimal relief.  Movement makes pain worse.  The history is provided by the patient.  Back Pain Associated symptoms: numbness   Associated symptoms: no abdominal pain and no fever     Past Medical History:  Diagnosis Date   Back pain    Carpal tunnel syndrome    bilateral   Complication of anesthesia    "hard to wake up"   Diabetes mellitus without complication (HCC)    Dyspnea    with excersion   Family history of adverse reaction to anesthesia    mom voniting   Obesity    PONV (postoperative nausea and vomiting)    Tricuspid valve regurgitation    also mitral regurg- trace on both per echo    Patient Active Problem List   Diagnosis Date Noted   Allergic rhinitis 10/31/2019   Cough 09/27/2019   Right ear pain 09/27/2019   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   Chest pain with high risk for cardiac etiology 11/22/2015    Past Surgical History:  Procedure Laterality Date   CARDIAC CATHETERIZATION N/A 11/24/2015   Procedure: Left Heart Cath and Coronary Angiography;  Surgeon: Adrian Prows, MD;  Location: [facility];  Service: Cardiovascular;  Laterality: N/A;   CESAREAN SECTION     DILATION AND CURETTAGE OF  UTERUS     x4   LUMBAR LAMINECTOMY/DECOMPRESSION MICRODISCECTOMY N/A 12/14/2016   Procedure: LUMBAR DECOMPRESSION MICRODISCECTOMY L5-S1;  Surgeon: Melina Schools, MD;  Location: [facility];  Service: Orthopedics;  Laterality: N/A;  120 mins   MASS EXCISION Right 04/01/2019   Procedure: EXCISION RIGHT ARM MASS;  Surgeon: Kinsinger, Arta Bruce, MD;  Location: [facility];  Service: General;  Laterality: Right;    OB History   No obstetric history on file.      Home Medications    Prior to Admission medications   Medication Sig Start Date End Date Taking? Authorizing Provider  cyclobenzaprine (FLEXERIL) 10 MG tablet Take 1 tablet (10 mg total) by mouth 2 (two) times daily as needed for muscle spasms. 02/05/22  Yes Francene Finders, PA-C  naproxen (NAPROSYN) 500 MG tablet Take 1 tablet (500 mg total) by mouth 2 (two) times daily. 02/05/22  Yes Francene Finders, PA-C  albuterol (PROVENTIL HFA;VENTOLIN HFA) 108 (90 Base) MCG/ACT inhaler Inhale 2 puffs into the lungs every 6 (six) hours as needed for wheezing or shortness of breath.     [provider]  aspirin 81 MG tablet Take 81 mg by mouth at bedtime.     [provider]  atorvastatin (LIPITOR) 10 MG tablet Take 10 mg by mouth every evening.     [provider]  benzonatate (TESSALON) 200 MG capsule Take 1 capsule (200 mg total) by mouth 3 (three) times daily as needed for cough. Patient not taking: Reported on 12/28/2020 12/27/19   Raylene Everts, MD  Biotin 10 MG CAPS Take 20 mg by mouth every evening.    [provider]  Cholecalciferol (DIALYVITE VITAMIN D 5000) 125 MCG (5000 UT) capsule Take 5,000 Units by mouth every evening.    [provider]  Cyanocobalamin (B-12 PO) Take 1 capsule by mouth every evening.    [provider]  diphenhydrAMINE (BENADRYL) 25 MG tablet Take 50 mg by mouth daily as needed for allergies.    [provider]  Dulaglutide (TRULICITY) 1.5 BM/8.4XL SOPN Inject  1.5 mg into the skin every Sunday.    [provider]  fluconazole (DIFLUCAN) 150 MG tablet Take 1 tablet (150 mg total) by mouth every 3 (three) days. Take first pill today.  May take second pill in 3 days if no resolution of symptoms with first pill. 06/01/21   Teodora Medici, FNP  fluconazole (DIFLUCAN) 200 MG tablet Take 200 mg by mouth every other day. In the evening 10/11/16   [provider]  fluticasone (FLONASE) 50 MCG/ACT nasal spray Place 2 sprays into both nostrils daily as needed for allergies.  11/23/16   [provider]  gabapentin (NEURONTIN) 300 MG capsule Take 1 capsule (300 mg total) by mouth 3 (three) times daily. 03/14/18   Pieter Partridge, DO  HYDROcodone-acetaminophen (NORCO/VICODIN) 5-325 MG tablet Take 1 tablet by mouth daily as needed (back pain).    [provider]  ibuprofen (ADVIL) 800 MG tablet Take 1 tablet (800 mg total) by mouth every 8 (eight) hours as needed. 04/01/19   Kinsinger, Arta Bruce, MD  Insulin Glargine (BASAGLAR KWIKPEN) 100 UNIT/ML SOPN Inject 100 Units into the skin daily with lunch.     [provider]  insulin regular (NOVOLIN R) 100 units/mL injection Inject 130 Units into the skin 3 (three) times daily before meals.    [provider]  linaclotide (LINZESS) 145 MCG CAPS capsule Take 1 capsule (145 mcg total) by mouth daily as needed. Patient taking differently: Take 145 mcg by mouth daily. 02/12/18   Ladene Artist, MD  losartan (COZAAR) 25 MG tablet Take 25 mg by mouth every evening.    [provider]  Melatonin 3 MG CAPS Take 3 mg by mouth at bedtime.    [provider]  metFORMIN (GLUCOPHAGE-XR) 500 MG 24 hr tablet Take 2,000 mg by mouth daily with supper.    [provider]  methocarbamol (ROBAXIN) 500 MG tablet Take 1 tablet (500 mg total) by mouth 3 (three) times daily. Patient taking differently: Take 500 mg by mouth daily as needed (severe muscle spams).  12/15/16    Mayo, Darla Lesches, PA-C  Multiple Vitamins-Minerals (HAIR/SKIN/NAILS) CAPS Take 3 capsules by mouth every evening.    [provider]  nitroGLYCERIN (NITROSTAT) 0.4 MG SL tablet Place 0.4 mg under the tongue every 5 (five) minutes as needed for chest pain.    [provider]  NOVOLOG 100 UNIT/ML injection Inject 4 Units into the skin. With each meal 10/09/19   [provider]  oxybutynin (DITROPAN-XL) 5 MG 24 hr tablet Take 5 mg by mouth daily.    [provider]  polyethylene glycol (MIRALAX / GLYCOLAX) 17  g packet Take 17 g by mouth daily.    [provider]  valACYclovir (VALTREX) 1000 MG tablet Take 1 tablet (1,000 mg total) by mouth 3 (three) times daily. 11/21/20   Scot Jun, FNP    Family History Family History  Problem Relation Age of Onset   Cancer Father    Diabetes Father    Heart disease Father    Hypertension Father    Cancer Maternal Grandmother    Diabetes Maternal Grandfather    Cancer Paternal Grandmother    Diabetes Paternal Grandfather     Social History Social History   Tobacco Use   Smoking status: Never   Smokeless tobacco: Never  Vaping Use   Vaping Use: Never used  Substance Use Topics   Alcohol use: Never   Drug use: Never     Allergies   Latex and Latex   Review of Systems Review of Systems  Constitutional:  Negative for chills and fever.  Eyes:  Negative for discharge and redness.  Gastrointestinal:  Negative for abdominal pain, nausea and vomiting.  Musculoskeletal:  Positive for back pain.  Neurological:  Positive for numbness.     Physical Exam Triage Vital Signs ED Triage Vitals  Enc Vitals Group     BP 02/05/22 1306 131/84     Pulse Rate 02/05/22 1306 80     Resp 02/05/22 1306 18     Temp 02/05/22 1306 98.2 F (36.8 C)     Temp Source 02/05/22 1306 Oral     SpO2 02/05/22 1306 100 %     Weight --      Height --      Head Circumference --      Peak Flow --      Pain  Score 02/05/22 1305 9     Pain Loc --      Pain Edu? --      Excl. in [place]? --    No data found.  Updated Vital Signs BP 131/84 (BP Location: Left Arm)   Pulse 80   Temp 98.2 F (36.8 C) (Oral)   Resp 18   SpO2 100%      Physical Exam Vitals and nursing note reviewed.  Constitutional:      General: She is not in acute distress.    Appearance: Normal appearance. She is not ill-appearing.  HENT:     Head: Normocephalic and atraumatic.  Eyes:     Conjunctiva/sclera: Conjunctivae normal.  Cardiovascular:     Rate and Rhythm: Normal rate.  Pulmonary:     Effort: Pulmonary effort is normal.  Musculoskeletal:     Comments: No tenderness palpation of midline thoracic or lumbar spine.  Tenderness palpation noted to right lower back diffusely.  Neurological:     Mental Status: She is alert.  Psychiatric:        Mood and Affect: Mood normal.        Behavior: Behavior normal.        Thought Content: Thought content normal.      UC Treatments / Results  Labs (all labs ordered are listed, but only abnormal results are displayed) Labs Reviewed - No data to display  EKG   Radiology No results found.  Procedures Procedures (including critical care time)  Medications Ordered in UC Medications - No data to display  Initial Impression / Assessment and Plan / UC Course  I have reviewed the triage vital signs and the nursing notes.  Pertinent labs & imaging results  that were available during my care of the patient were reviewed by me and considered in my medical decision making (see chart for details).    Flexeril prescribed as well as naproxen for suspected muscular strain.  Recommended slow controlled stretches.  Encouraged follow-up with any further concerns.  Final Clinical Impressions(s) / UC Diagnoses   Final diagnoses:  Acute right-sided low back pain with right-sided sciatica   Discharge Instructions   None    ED Prescriptions     Medication Sig Dispense  Auth. Provider   cyclobenzaprine (FLEXERIL) 10 MG tablet Take 1 tablet (10 mg total) by mouth 2 (two) times daily as needed for muscle spasms. 20 tablet Ewell Poe F, PA-C   naproxen (NAPROSYN) 500 MG tablet Take 1 tablet (500 mg total) by mouth 2 (two) times daily. 30 tablet Francene Finders, PA-C      PDMP not reviewed this encounter.   Francene Finders, PA-C 02/05/22 1334

## 2022-02-24 DIAGNOSIS — L308 Other specified dermatitis: Secondary | ICD-10-CM | POA: Diagnosis not present

## 2022-02-24 DIAGNOSIS — Z08 Encounter for follow-up examination after completed treatment for malignant neoplasm: Secondary | ICD-10-CM | POA: Diagnosis not present

## 2022-02-24 DIAGNOSIS — Z85828 Personal history of other malignant neoplasm of skin: Secondary | ICD-10-CM | POA: Diagnosis not present

## 2022-02-25 DIAGNOSIS — E1165 Type 2 diabetes mellitus with hyperglycemia: Secondary | ICD-10-CM | POA: Diagnosis not present

## 2022-03-10 DIAGNOSIS — Z3202 Encounter for pregnancy test, result negative: Secondary | ICD-10-CM | POA: Diagnosis not present

## 2022-03-10 DIAGNOSIS — Z124 Encounter for screening for malignant neoplasm of cervix: Secondary | ICD-10-CM | POA: Diagnosis not present

## 2022-03-10 DIAGNOSIS — Z01419 Encounter for gynecological examination (general) (routine) without abnormal findings: Secondary | ICD-10-CM | POA: Diagnosis not present

## 2022-03-10 DIAGNOSIS — Z1231 Encounter for screening mammogram for malignant neoplasm of breast: Secondary | ICD-10-CM | POA: Diagnosis not present

## 2022-03-10 DIAGNOSIS — Z113 Encounter for screening for infections with a predominantly sexual mode of transmission: Secondary | ICD-10-CM | POA: Diagnosis not present

## 2022-04-11 ENCOUNTER — Ambulatory Visit: Payer: Self-pay

## 2022-04-19 DIAGNOSIS — M7989 Other specified soft tissue disorders: Secondary | ICD-10-CM | POA: Diagnosis not present

## 2022-04-20 ENCOUNTER — Ambulatory Visit
Admission: RE | Admit: 2022-04-20 | Discharge: 2022-04-20 | Disposition: A | Discharge status: Home / Self Care | Payer: Federal, State, Local not specified - PPO | Source: Ambulatory Visit | Attending: Family Medicine | Admitting: Family Medicine

## 2022-04-20 ENCOUNTER — Other Ambulatory Visit: Payer: Self-pay | Admitting: Family Medicine

## 2022-04-20 DIAGNOSIS — M7989 Other specified soft tissue disorders: Secondary | ICD-10-CM | POA: Diagnosis not present

## 2022-05-27 DIAGNOSIS — L281 Prurigo nodularis: Secondary | ICD-10-CM | POA: Diagnosis not present

## 2022-05-27 DIAGNOSIS — D485 Neoplasm of uncertain behavior of skin: Secondary | ICD-10-CM | POA: Diagnosis not present

## 2022-07-01 DIAGNOSIS — G629 Polyneuropathy, unspecified: Secondary | ICD-10-CM | POA: Diagnosis not present

## 2022-07-01 DIAGNOSIS — E1165 Type 2 diabetes mellitus with hyperglycemia: Secondary | ICD-10-CM | POA: Diagnosis not present

## 2022-07-05 ENCOUNTER — Other Ambulatory Visit: Payer: Federal, State, Local not specified - PPO

## 2022-07-05 ENCOUNTER — Encounter: Payer: Self-pay | Admitting: Neurology

## 2022-07-05 ENCOUNTER — Ambulatory Visit: Payer: Federal, State, Local not specified - PPO | Admitting: Neurology

## 2022-07-05 VITALS — BP 139/76 | HR 73 | Ht 68.0 in | Wt 255.0 lb

## 2022-07-05 DIAGNOSIS — R202 Paresthesia of skin: Secondary | ICD-10-CM

## 2022-07-05 DIAGNOSIS — R200 Anesthesia of skin: Secondary | ICD-10-CM | POA: Diagnosis not present

## 2022-07-05 MED ORDER — DULOXETINE HCL 60 MG PO CPEP: 60.0000 mg | ORAL_CAPSULE | Freq: Every day | ORAL | 5 refills | Status: AC

## 2022-07-05 MED ORDER — GABAPENTIN 300 MG PO CAPS: 300.0000 mg | ORAL_CAPSULE | Freq: Two times a day (BID) | ORAL | 0 refills | Status: DC

## 2022-07-05 NOTE — Patient Instructions (Signed)
Take Cymbalta '30mg'$  in the morning and start '60mg'$  at bedtime  Reduce gabapentin '600mg'$  at bedtime  Nerve testing of the legs  ELECTROMYOGRAM AND NERVE CONDUCTION STUDIES (EMG/NCS) INSTRUCTIONS  How to Prepare The neurologist conducting the EMG will need to know if you have certain medical conditions. Tell the neurologist and other EMG lab personnel if you: Have a pacemaker or any other electrical medical device Take blood-thinning medications Have hemophilia, a blood-clotting disorder that causes prolonged bleeding Bathing Take a shower or bath shortly before your exam in order to remove oils from your skin. Don't apply lotions or creams before the exam.  What to Expect You'll likely be asked to change into a hospital gown for the procedure and lie down on an examination table. The following explanations can help you understand what will happen during the exam.  Electrodes. The neurologist or a technician places surface electrodes at various locations on your skin depending on where you're experiencing symptoms. Or the neurologist may insert needle electrodes at different sites depending on your symptoms.  Sensations. The electrodes will at times transmit a tiny electrical current that you may feel as a twinge or spasm. The needle electrode may cause discomfort or pain that usually ends shortly after the needle is removed. If you are concerned about discomfort or pain, you may want to talk to the neurologist about taking a short break during the exam.  Instructions. During the needle EMG, the neurologist will assess whether there is any spontaneous electrical activity when the muscle is at rest - activity that isn't present in healthy muscle tissue - and the degree of activity when you slightly contract the muscle.  He or she will give you instructions on resting and contracting a muscle at appropriate times. Depending on what muscles and nerves the neurologist is examining, he or she may ask you  to change positions during the exam.  After your EMG You may experience some temporary, minor bruising where the needle electrode was inserted into your muscle. This bruising should fade within several days. If it persists, contact your primary care doctor.

## 2022-07-05 NOTE — Progress Notes (Signed)
[facility] Clinic Note - Initial Visit   Date: 07/05/2022   [name] MRN: [id number] DOB: 1980/08/07   Dear Dr. Moreen Fowler:  Thank you for your kind referral of [name] for consultation of bilateral feet pain. Although her history is well known to you, please allow [place] to reiterate it for the purpose of our medical record. The patient was accompanied to the clinic by self.    [name] is a 42 y.o. right-handed female with poorly controlled diabetes mellitus (HbA1c 13), s/p L5-S1 decompression microdisectomy (2018), and obesity presenting for evaluation of bilateral leg numbness/tingling.   IMPRESSION/PLAN: Bilateral leg pain and feet paresthesias most suggestive of diabetic neuropathy.  Given her history of lumbar canal stenosis s/p decompression, it is possible she may have overlapping adjacent disease.  To further evlauation, she will return for electrodiagnostic testing of the legs.  Labs to exclude other causes for her neuropathy will also be checked to be complete.  Discussed the management of neuropathy which is focused at treating the underlying condition, diabetes.    - NCS/EMG of bilateral legs  - Check ESR, CRP, vitamin B12, folate, copper, SPEP with IFE  - Increase Cymbalta to '90mg'$ /d and reduce gabapentin to '600mg'$  at bedtime   - Follow-up with endocrinology to optimize diabetes management  Further recommendations pending results.   ------------------------------------------------------------- History of present illness: She has chronic right leg shooting pain which has been ongoing even prior to her back surgery in 2018. She underwent L5-S1 microdiscectomy and decompression in 2018 for large disc extrusion at L5-S1. She is seeing Dr. Nelva Bush and has ESI which provides transient pain relief.  She also has numbness in the feet for a number of years.  More recently, she began having shooting pain and tingling in the feet.   She complains of cramping and swelling in the legs.   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  She is going through a separation and reports being under a lot of stress.  Out-side paper records, electronic medical record, and images have been reviewed where available and summarized as:  NCS/EMG of the arms 08/26/2020: Right median neuropathy at or distal to the wrist (moderate), consistent with a clinical diagnosis of carpal tunnel syndrome.   Left median neuropathy at or distal to the wrist (mild), consistent with a clinical diagnosis of carpal tunnel syndrome.   There is no evidence of a sensorimotor polyneuropathy affecting the upper extremities.  MRI of lumbar spine with and without contrast from 06/26/17 demonstrated "1. Postsurgical changes at L5-S1 without residual disc herniation.  Enhancing granulation tissue encasing descending right S1 nerve roots with mild mass effect and displacement of descending right S1 nerve roots without central stenosis.  2.  Mild L3-4 spinal canal stenosis with contact of descending right L4 nerve roots without displacement, likely not significantly changed.  3.  Mild L4-5 spinal canal stenosis, not significantly changed."   Lab Results  Component Value Date   HGBA1C 10.2 (H) 03/29/2019   No results found for: "VITAMINB12" No results found for: "TSH" No results found for: "ESRSEDRATE", "POCTSEDRATE"  Past Medical History:  Diagnosis Date   Back pain    Carpal tunnel syndrome    bilateral   Complication of anesthesia    "hard to wake up"   Diabetes mellitus without complication ([place])    Dyspnea    with excersion   Family history of adverse reaction to anesthesia  mom voniting   Obesity    PONV (postoperative nausea and vomiting)    Tricuspid valve regurgitation    also mitral regurg- trace on both per echo    Past Surgical History:  Procedure Laterality Date   CARDIAC CATHETERIZATION N/A 11/24/2015   Procedure: Left  Heart Cath and Coronary Angiography;  Surgeon: Adrian Prows, MD;  Location: [facility];  Service: Cardiovascular;  Laterality: N/A;   CESAREAN SECTION     DILATION AND CURETTAGE OF UTERUS     x4   LUMBAR LAMINECTOMY/DECOMPRESSION MICRODISCECTOMY N/A 12/14/2016   Procedure: LUMBAR DECOMPRESSION MICRODISCECTOMY L5-S1;  Surgeon: Melina Schools, MD;  Location: [facility];  Service: Orthopedics;  Laterality: N/A;  120 mins   MASS EXCISION Right 04/01/2019   Procedure: EXCISION RIGHT ARM MASS;  Surgeon: Mickeal Skinner, MD;  Location: [facility];  Service: General;  Laterality: Right;     Medications:  Outpatient Encounter Medications as of 07/05/2022  Medication Sig Note   albuterol (PROVENTIL HFA;VENTOLIN HFA) 108 (90 Base) MCG/ACT inhaler Inhale 2 puffs into the lungs every 6 (six) hours as needed for wheezing or shortness of breath.     aspirin 81 MG tablet Take 81 mg by mouth at bedtime.     atorvastatin (LIPITOR) 10 MG tablet Take 10 mg by mouth every evening.     benzonatate (TESSALON) 200 MG capsule Take 1 capsule (200 mg total) by mouth 3 (three) times daily as needed for cough. (Patient not taking: Reported on 12/28/2020)    Biotin 10 MG CAPS Take 20 mg by mouth every evening.    Cholecalciferol (DIALYVITE VITAMIN D 5000) 125 MCG (5000 UT) capsule Take 5,000 Units by mouth every evening.    Cyanocobalamin (B-12 PO) Take 1 capsule by mouth every evening.    cyclobenzaprine (FLEXERIL) 10 MG tablet Take 1 tablet (10 mg total) by mouth 2 (two) times daily as needed for muscle spasms.    diphenhydrAMINE (BENADRYL) 25 MG tablet Take 50 mg by mouth daily as needed for allergies.    Dulaglutide (TRULICITY) 1.5 0000000 SOPN Inject 1.5 mg into the skin every Sunday.    fluconazole (DIFLUCAN) 150 MG tablet Take 1 tablet (150 mg total) by mouth every 3 (three) days. Take first pill today.  May take second pill in 3 days if no resolution of symptoms with first pill.    fluconazole (DIFLUCAN) 200 MG  tablet Take 200 mg by mouth every other day. In the evening    fluticasone (FLONASE) 50 MCG/ACT nasal spray Place 2 sprays into both nostrils daily as needed for allergies.     gabapentin (NEURONTIN) 300 MG capsule Take 1 capsule (300 mg total) by mouth 3 (three) times daily.    HYDROcodone-acetaminophen (NORCO/VICODIN) 5-325 MG tablet Take 1 tablet by mouth daily as needed (back pain).    ibuprofen (ADVIL) 800 MG tablet Take 1 tablet (800 mg total) by mouth every 8 (eight) hours as needed.    Insulin Glargine (BASAGLAR KWIKPEN) 100 UNIT/ML SOPN Inject 100 Units into the skin daily with lunch.  04/01/2019: 50 units, 03/31/19, 1330   insulin regular (NOVOLIN R) 100 units/mL injection Inject 130 Units into the skin 3 (three) times daily before meals.    linaclotide (LINZESS) 145 MCG CAPS capsule Take 1 capsule (145 mcg total) by mouth daily as needed. (Patient taking differently: Take 145 mcg by mouth daily.)    losartan (COZAAR) 25 MG tablet Take 25 mg by mouth every evening.    Melatonin  3 MG CAPS Take 3 mg by mouth at bedtime.    metFORMIN (GLUCOPHAGE-XR) 500 MG 24 hr tablet Take 2,000 mg by mouth daily with supper.    methocarbamol (ROBAXIN) 500 MG tablet Take 1 tablet (500 mg total) by mouth 3 (three) times daily. (Patient taking differently: Take 500 mg by mouth daily as needed (severe muscle spams). )    Multiple Vitamins-Minerals (HAIR/SKIN/NAILS) CAPS Take 3 capsules by mouth every evening.    naproxen (NAPROSYN) 500 MG tablet Take 1 tablet (500 mg total) by mouth 2 (two) times daily.    nitroGLYCERIN (NITROSTAT) 0.4 MG SL tablet Place 0.4 mg under the tongue every 5 (five) minutes as needed for chest pain.    NOVOLOG 100 UNIT/ML injection Inject 4 Units into the skin. With each meal    oxybutynin (DITROPAN-XL) 5 MG 24 hr tablet Take 5 mg by mouth daily.    polyethylene glycol (MIRALAX / GLYCOLAX) 17 g packet Take 17 g by mouth daily.    valACYclovir (VALTREX) 1000 MG tablet Take 1 tablet  (1,000 mg total) by mouth 3 (three) times daily.    No facility-administered encounter medications on file as of 07/05/2022.    Allergies:  Allergies  Allergen Reactions   Latex    Latex Rash    Family History: Family History  Problem Relation Age of Onset   Cancer Father    Diabetes Father    Heart disease Father    Hypertension Father    Cancer Maternal Grandmother    Diabetes Maternal Grandfather    Cancer Paternal Grandmother    Diabetes Paternal Grandfather     Social History: Social History   Tobacco Use   Smoking status: Never   Smokeless tobacco: Never  Vaping Use   Vaping Use: Never used  Substance Use Topics   Alcohol use: Never   Drug use: Never   Social History   Social History Narrative   ** Merged History Encounter **    Are you right handed or left handed? right   Are you currently employed ? 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   Who lives with you? daughter   What type of home do you live in: 1 story or 2 story? two   Caffeine 2 time a week     Vital Signs:  BP 139/76   Pulse 73   Ht '5\' 8"'$  (1.727 m)   Wt 255 lb (115.7 kg)   SpO2 97%   BMI 38.77 kg/m    Neurological Exam: MENTAL STATUS including orientation to time, place, person, recent and remote memory, attention span and concentration, language, and fund of knowledge is normal.  Speech is not dysarthric.  CRANIAL NERVES: II:  No visual field defects.     III-IV-VI: Pupils equal round and reactive to light.  Normal conjugate, extra-ocular eye movements in all directions of gaze.  No nystagmus.  No ptosis.   V:  Normal facial sensation.    VII:  Normal facial symmetry and movements.   VIII:  Normal hearing and vestibular function.   IX-X:  Normal palatal movement.   XI:  Normal shoulder shrug and head rotation.   XII:  Normal tongue strength and range of motion, no deviation or fasciculation.  MOTOR:  No atrophy, fasciculations or  abnormal movements.  No pronator drift.   Upper Extremity:  Right  Left  Deltoid  5/5   5/5   Biceps  5/5   5/5   Triceps  5/5   5/5   Wrist extensors  5/5   5/5   Wrist flexors  5/5   5/5   Finger extensors  5/5   5/5   Finger flexors  5/5   5/5   Dorsal interossei  5/5   5/5   Abductor pollicis  5/5   5/5   Tone (Ashworth scale)  0  0   Lower Extremity:  Right  Left  Hip flexors  5/5   5/5   Knee flexors  5/5   5/5   Knee extensors  5/5   5/5   Dorsiflexors  5/5   5/5   Plantarflexors  5/5   5/5   Toe extensors  5-/5   5-/5   Toe flexors  5-/5   5-/5   Tone (Ashworth scale)  0  0   MSRs:                                           Right        Left brachioradialis 2+  2+  biceps 2+  2+  triceps 2+  2+  patellar 1+  1+  ankle jerk 0  0  Hoffman no  no  plantar response down  down   SENSORY:  Vibration and temperature reduced from mid-calf into the feet. Sensation intact in the hands.   Romberg's sign absent.   COORDINATION/GAIT: Normal finger-to- nose-finger.  Intact rapid alternating movements bilaterally. Gait mildly wide-based and stable. Stressed gait intact.  She is unable to perform tandem gait.   Total time spent reviewing records, interview, history/exam, documentation, and coordination of care on day of encounter:  45 min    Thank you for allowing me to participate in patient's care.  If I can answer any additional questions, I would be pleased to do so.    Sincerely,    Sandra Tellefsen K. Posey Pronto, DO

## 2022-07-15 DIAGNOSIS — F321 Major depressive disorder, single episode, moderate: Secondary | ICD-10-CM | POA: Diagnosis not present

## 2022-07-15 DIAGNOSIS — E1169 Type 2 diabetes mellitus with other specified complication: Secondary | ICD-10-CM | POA: Diagnosis not present

## 2022-07-15 DIAGNOSIS — M545 Low back pain, unspecified: Secondary | ICD-10-CM | POA: Diagnosis not present

## 2022-07-15 DIAGNOSIS — E782 Mixed hyperlipidemia: Secondary | ICD-10-CM | POA: Diagnosis not present

## 2022-07-16 LAB — COPPER, SERUM: Copper: 150 ug/dL (ref 70–175)

## 2022-07-16 LAB — PROTEIN ELECTROPHORESIS, SERUM
Albumin ELP: 4.3 g/dL (ref 3.8–4.8)
Alpha 1: 0.3 g/dL (ref 0.2–0.3)
Alpha 2: 0.9 g/dL (ref 0.5–0.9)
Beta 2: 0.5 g/dL (ref 0.2–0.5)
Beta Globulin: 0.6 g/dL (ref 0.4–0.6)
Gamma Globulin: 1 g/dL (ref 0.8–1.7)
Total Protein: 7.6 g/dL (ref 6.1–8.1)

## 2022-07-16 LAB — C-REACTIVE PROTEIN: CRP: 11.4 mg/L — ABNORMAL HIGH (ref <8.0)

## 2022-07-16 LAB — IMMUNOFIXATION ELECTROPHORESIS
IgG (Immunoglobin G), Serum: 1230 mg/dL (ref 600–1640)
IgM, Serum: 61 mg/dL (ref 50–300)
Immunoglobulin A: 277 mg/dL (ref 47–310)

## 2022-07-16 LAB — SEDIMENTATION RATE: Sed Rate: 19 mm/h (ref 0–20)

## 2022-07-16 LAB — B12 AND FOLATE PANEL
Folate: 23.3 ng/mL
Vitamin B-12: 422 pg/mL (ref 200–1100)

## 2022-07-22 ENCOUNTER — Ambulatory Visit (INDEPENDENT_AMBULATORY_CARE_PROVIDER_SITE_OTHER): Payer: Federal, State, Local not specified - PPO | Admitting: Neurology

## 2022-07-22 DIAGNOSIS — J069 Acute upper respiratory infection, unspecified: Secondary | ICD-10-CM | POA: Diagnosis not present

## 2022-07-22 DIAGNOSIS — R202 Paresthesia of skin: Secondary | ICD-10-CM

## 2022-07-22 DIAGNOSIS — Z03818 Encounter for observation for suspected exposure to other biological agents ruled out: Secondary | ICD-10-CM | POA: Diagnosis not present

## 2022-07-22 DIAGNOSIS — M5417 Radiculopathy, lumbosacral region: Secondary | ICD-10-CM

## 2022-07-22 DIAGNOSIS — R200 Anesthesia of skin: Secondary | ICD-10-CM

## 2022-07-22 NOTE — Procedures (Signed)
[facility]  [address]  [place], [place] [address] Tel: [phone] Fax: [phone] Test Date:  07/22/2022  Patient: [name] DOB: January 10, 1981 Physician: Narda Amber, DO  Sex: Female Height: 5\' 8"  Ref Phys: Narda Amber, DO  ID#: [id number]   Technician:    History: This is a 42 year old female referred for evaluation of bilateral leg paresthesias and pain, worse on the right.  NCV & EMG Findings: Extensive electrodiagnostic testing of the right lower extremity and additional studies of the left shows:  Right superficial peroneal sensory response is absent, and most likely due to proximal dorsal root ganglion.  Left superficial peroneal and bilateral sural sensory responses are within normal limits. Right peroneal (EDB) and tibial motor responses show reduced amplitudes.  Right peroneal motor response at the tibialis anterior is within normal limits.  Left peroneal and tibial motor responses are within normal limits. Right tibial H reflex study is absent.  Left tibial H reflex is mildly prolonged. Chronic motor axon loss changes are seen affecting the right L5-S1 myotome, without accompanying active denervation.  These findings are not present in the left lower extremity.  Impression: Chronic L5-S1 radiculopathy affecting the right lower extremity, moderate. There is no evidence of a large fiber sensorimotor polyneuropathy affecting the lower extremities.   ___________________________ Narda Amber, DO    Nerve Conduction Studies   Stim Site NR Peak (ms) Norm Peak (ms) O-P Amp (V) Norm O-P Amp  Left Sup Peroneal Anti Sensory (Ant Lat Mall)  32 C  12 cm    2.3 <4.5 7.9 >5  Right Sup Peroneal Anti Sensory (Ant Lat Mall)  32 C  12 cm *NR  <4.5  >5  Left Sural Anti Sensory (Lat Mall)  32 C  Calf    3.0 <4.5 7.9 >5  Right Sural Anti Sensory (Lat Mall)  32 C  Calf    2.4 <4.5 7.9 >5     Stim Site NR Onset (ms) Norm Onset (ms) O-P Amp (mV)  Norm O-P Amp Site1 Site2 Delta-0 (ms) Dist (cm) Vel (m/s) Norm Vel (m/s)  Left Peroneal Motor (Ext Dig Brev)  32 C  Ankle    4.1 <5.5 4.0 >3 B Fib Ankle 9.6 36.0 *38 >40  B Fib    13.7  2.7  Poplt B Fib 1.7 8.0 47 >40  Poplt    15.4  2.7         Right Peroneal Motor (Ext Dig Brev)  32 C  Ankle    3.8 <5.5 *0.8 >3 B Fib Ankle 9.3 37.0 40 >40  B Fib    13.1  0.5  Poplt B Fib 2.0 8.0 40 >40  Poplt    15.1  0.5         Left Peroneal TA Motor (Tib Ant)  32 C  Fib Head    2.6 <4.0 5.0 >4 Poplit Fib Head 1.5 8.0 53 >40  Poplit    4.1 <5.7 4.8         Right Peroneal TA Motor (Tib Ant)  32 C  Fib Head    3.3 <4.0 4.4 >4 Poplit Fib Head 1.4 8.0 57 >40  Poplit    4.7 <5.7 4.0         Left Tibial Motor (Abd Hall Brev)  32 C  Ankle    4.4 <6.0 10.7 >8 Knee Ankle 10.4 42.0 40 >40  Knee    14.8  8.4  Right Tibial Motor (Abd Hall Brev)  32 C  Ankle    3.7 <6.0 *1.6 >8 Knee Ankle 10.4 42.0 40 >40  Knee    14.1  1.4          Electromyography   Side Muscle Ins.Act Fibs Fasc Recrt Amp Dur Poly Activation Comment  Right AntTibialis Nml Nml Nml *1- *1+ *1+ *1+ Nml N/A  Right Gastroc Nml Nml Nml *1- *1+ *1+ *1+ Nml N/A  Right Flex Dig Long Nml Nml Nml *1- *1+ *1+ *1+ Nml N/A  Right RectFemoris Nml Nml Nml Nml Nml Nml Nml Nml N/A  Right BicepsFemS Nml Nml Nml *1- *1+ *1+ *1+ Nml N/A  Right GluteusMed Nml Nml Nml Nml Nml Nml Nml Nml N/A  Left AntTibialis Nml Nml Nml Nml Nml Nml Nml Nml N/A  Left Gastroc Nml Nml Nml Nml Nml Nml Nml Nml N/A  Left Flex Dig Long Nml Nml Nml Nml Nml Nml Nml Nml N/A  Left RectFemoris Nml Nml Nml Nml Nml Nml Nml Nml N/A  Left GluteusMed Nml Nml Nml Nml Nml Nml Nml Nml N/A      Waveforms:

## 2022-07-25 ENCOUNTER — Other Ambulatory Visit: Payer: Self-pay

## 2022-07-25 DIAGNOSIS — R202 Paresthesia of skin: Secondary | ICD-10-CM

## 2022-07-25 DIAGNOSIS — M5417 Radiculopathy, lumbosacral region: Secondary | ICD-10-CM

## 2022-07-25 DIAGNOSIS — M5126 Other intervertebral disc displacement, lumbar region: Secondary | ICD-10-CM

## 2022-07-31 ENCOUNTER — Other Ambulatory Visit: Payer: Self-pay | Admitting: Neurology

## 2022-08-02 DIAGNOSIS — E669 Obesity, unspecified: Secondary | ICD-10-CM | POA: Diagnosis not present

## 2022-08-02 DIAGNOSIS — E785 Hyperlipidemia, unspecified: Secondary | ICD-10-CM | POA: Diagnosis not present

## 2022-08-02 DIAGNOSIS — E1165 Type 2 diabetes mellitus with hyperglycemia: Secondary | ICD-10-CM | POA: Diagnosis not present

## 2022-08-14 ENCOUNTER — Ambulatory Visit
Admission: RE | Admit: 2022-08-14 | Discharge: 2022-08-14 | Disposition: A | Discharge status: Home / Self Care | Payer: Federal, State, Local not specified - PPO | Source: Ambulatory Visit | Attending: Neurology | Admitting: Neurology

## 2022-08-14 DIAGNOSIS — M545 Low back pain, unspecified: Secondary | ICD-10-CM | POA: Diagnosis not present

## 2022-08-14 DIAGNOSIS — M5126 Other intervertebral disc displacement, lumbar region: Secondary | ICD-10-CM

## 2022-08-14 DIAGNOSIS — R200 Anesthesia of skin: Secondary | ICD-10-CM

## 2022-08-14 DIAGNOSIS — M5417 Radiculopathy, lumbosacral region: Secondary | ICD-10-CM

## 2022-08-14 DIAGNOSIS — M48061 Spinal stenosis, lumbar region without neurogenic claudication: Secondary | ICD-10-CM | POA: Diagnosis not present

## 2022-08-15 DIAGNOSIS — L03032 Cellulitis of left toe: Secondary | ICD-10-CM | POA: Diagnosis not present

## 2022-08-16 ENCOUNTER — Ambulatory Visit: Payer: Federal, State, Local not specified - PPO | Admitting: Dermatology

## 2022-08-16 DIAGNOSIS — L649 Androgenic alopecia, unspecified: Secondary | ICD-10-CM

## 2022-08-16 DIAGNOSIS — L209 Atopic dermatitis, unspecified: Secondary | ICD-10-CM | POA: Diagnosis not present

## 2022-08-16 DIAGNOSIS — L2089 Other atopic dermatitis: Secondary | ICD-10-CM

## 2022-08-16 MED ORDER — CLOBETASOL PROPIONATE 0.05 % EX CREA: 1.0000 | TOPICAL_CREAM | Freq: Two times a day (BID) | CUTANEOUS | 0 refills | Status: DC

## 2022-08-16 MED ORDER — TACROLIMUS 0.1 % EX OINT: TOPICAL_OINTMENT | Freq: Two times a day (BID) | CUTANEOUS | 0 refills | Status: DC

## 2022-08-16 NOTE — Patient Instructions (Addendum)
    Treatment Plan for rash: -Clobetasol cream twice a day for up to two weeks. Alternate with Tacrolimus -Tacrolimus ointment twice a day for up to two weeks. Alternate with Tacrolimus -Moisturize skin

## 2022-08-16 NOTE — Progress Notes (Unsigned)
   New Patient Visit   Subjective  [name] is a 42 y.o. female who presents for the following: Hair loss and a rash  Patient complains of hair loss ober the course of five years. Patient is diabetic and is not under control but in the process of working on dosage. Has not noticed any shedding but notices it in pictures. There are a lot of stressfully factors in patients life and a few surgeries in the past two to three years.  Complaints of rash started about a month ago. Rash is around the waist band area. Insulin dosage changed around the time the itching and rash started. Has tried OTC hydrocortisone ointment, triamcinolone with a little relief. Itching is worse at night. Patient says she does better with oral medications rather than topicals.   The following portions of the chart were reviewed this encounter and updated as appropriate: medications, allergies, medical history  Review of Systems:  No other skin or systemic complaints except as noted in HPI or Assessment and Plan.  Objective  Well appearing patient in no apparent distress; mood and affect are within normal limits.  A focused examination was performed of the following areas: Scalp and Waist  Relevant exam findings are noted in the Assessment and Plan.    Assessment & Plan  Agnogenic Alopecia  Exam: diffuse thinning on frontal scalp with significant amount of scalp showing through  Patient Counseling -Explained to patient that this is caused by genetic predisposition and hormonal changes -We cannot change genetics to "cure" this condition but we can treat with a combination of topical and sometimes oral medications -Treatment is most effective at preventing additional hair loss and we cannot predict exactly how much hair already lost will grow back - First line topical tx's include: topical minoxidil, finasteride, spironolactone, or dutasteride -Oral teatments include: oral minoxidil, oral finasteride, oral  spironolactone -Other interventions: PRP, red light, hair transplant   Treatment: -OTC 5% Minoxidil everyday -Viviscal Vitamin and collagen everyday  Atopic Dermatitis ATOPIC DERMATITIS Exam: Scaly pink papules coalescing to plaques  Flared  Atopic dermatitis (eczema) is a chronic, relapsing, pruritic condition that can significantly affect quality of life. It is often associated with allergic rhinitis and/or asthma and can require treatment with topical medications, phototherapy, or in severe cases biologic injectable medication (Dupixent; Adbry) or Oral JAK inhibitors.  Treatment Plan: -Clobetasol cream twice a day for up to two weeks. Alternate with Tacrolimus -Tacrolimus ointment twice a day for up to two weeks. Alternate with Clobetasol -Moisturize skin daily after showers -NO HOT SHOWERS  Recommend gentle skin care.   Return in about 3 months (around 11/15/2022) for Hair loss follow up.    Documentation: I have reviewed the above documentation for accuracy and completeness, and I agree with the above.  Langston Reusing, MD  I, Germaine Pomfret, CMA, am acting as scribe for Langston Reusing, MD.

## 2022-08-17 ENCOUNTER — Encounter: Payer: Self-pay | Admitting: Dermatology

## 2022-08-25 ENCOUNTER — Encounter (INDEPENDENT_AMBULATORY_CARE_PROVIDER_SITE_OTHER): Payer: Federal, State, Local not specified - PPO | Admitting: Internal Medicine

## 2022-08-31 DIAGNOSIS — M5451 Vertebrogenic low back pain: Secondary | ICD-10-CM | POA: Diagnosis not present

## 2022-09-01 DIAGNOSIS — M25571 Pain in right ankle and joints of right foot: Secondary | ICD-10-CM | POA: Diagnosis not present

## 2022-09-01 DIAGNOSIS — S93491A Sprain of other ligament of right ankle, initial encounter: Secondary | ICD-10-CM | POA: Diagnosis not present

## 2022-09-01 DIAGNOSIS — M25572 Pain in left ankle and joints of left foot: Secondary | ICD-10-CM | POA: Diagnosis not present

## 2022-09-13 DIAGNOSIS — M5136 Other intervertebral disc degeneration, lumbar region: Secondary | ICD-10-CM | POA: Diagnosis not present

## 2022-09-13 DIAGNOSIS — M961 Postlaminectomy syndrome, not elsewhere classified: Secondary | ICD-10-CM | POA: Diagnosis not present

## 2022-09-19 DIAGNOSIS — M79671 Pain in right foot: Secondary | ICD-10-CM | POA: Diagnosis not present

## 2022-10-19 DIAGNOSIS — N760 Acute vaginitis: Secondary | ICD-10-CM | POA: Diagnosis not present

## 2022-10-19 DIAGNOSIS — B009 Herpesviral infection, unspecified: Secondary | ICD-10-CM | POA: Diagnosis not present

## 2022-10-19 DIAGNOSIS — B001 Herpesviral vesicular dermatitis: Secondary | ICD-10-CM | POA: Diagnosis not present

## 2022-10-19 DIAGNOSIS — Z113 Encounter for screening for infections with a predominantly sexual mode of transmission: Secondary | ICD-10-CM | POA: Diagnosis not present

## 2022-11-01 ENCOUNTER — Encounter (INDEPENDENT_AMBULATORY_CARE_PROVIDER_SITE_OTHER): Payer: Self-pay | Admitting: Family Medicine

## 2022-11-01 ENCOUNTER — Ambulatory Visit (INDEPENDENT_AMBULATORY_CARE_PROVIDER_SITE_OTHER): Payer: Federal, State, Local not specified - PPO | Admitting: Family Medicine

## 2022-11-01 VITALS — BP 129/80 | HR 89 | Temp 98.5°F | Ht 67.0 in | Wt 248.0 lb

## 2022-11-01 DIAGNOSIS — Z7984 Long term (current) use of oral hypoglycemic drugs: Secondary | ICD-10-CM

## 2022-11-01 DIAGNOSIS — E119 Type 2 diabetes mellitus without complications: Secondary | ICD-10-CM

## 2022-11-01 DIAGNOSIS — E66812 Obesity, class 2: Secondary | ICD-10-CM

## 2022-11-01 DIAGNOSIS — Z794 Long term (current) use of insulin: Secondary | ICD-10-CM

## 2022-11-01 DIAGNOSIS — E669 Obesity, unspecified: Secondary | ICD-10-CM | POA: Diagnosis not present

## 2022-11-01 DIAGNOSIS — E282 Polycystic ovarian syndrome: Secondary | ICD-10-CM | POA: Diagnosis not present

## 2022-11-01 DIAGNOSIS — Z6838 Body mass index (BMI) 38.0-38.9, adult: Secondary | ICD-10-CM

## 2022-11-01 DIAGNOSIS — L659 Nonscarring hair loss, unspecified: Secondary | ICD-10-CM | POA: Insufficient documentation

## 2022-11-01 NOTE — Assessment & Plan Note (Signed)
Patient notes a history of PCOS with irregular menstrual cycles. She is on metformin for underlying type 2 diabetes. Plan to obtain a fasting insulin at her next visit. 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 Plan to check a testosterone level given her complaint of hair loss with PCOS

## 2022-11-01 NOTE — Assessment & Plan Note (Signed)
Reviewed weight history, program information and current bioimpedance results. Patient is motivated to begin her active plan for weight reduction. We discussed the 4 pillars for obesity treatment. She will begin thinking about options to increase levels of physical activity given her barriers of working 2 jobs and high stress.

## 2022-11-01 NOTE — Assessment & Plan Note (Signed)
Managed by Dr Sharl Ma, diagnosed ~12 years ago Reports poor glucose control with a high A1c, labs done at [facility] She is taking metformin XR 500 mg tab, 4 tabs with dinner daily, insulin glargine 100 units with lunch, Trulicity 1.5 mg once weekly with regular insulin at mealtime.  She occasionally has low blood sugar readings.  Obtaining a continuous blood glucose monitor was cost prohibitive.  She struggles with eating on a schedule, healthy food choices and dietary change due to a busy schedule.  We discussed the importance of dietary change with obesity management and glucose control. We will be working on increasing her physical activity level to help with insulin sensitivity.  She does plan to come off Trulicity due to complaints of diarrhea.  Consider use of Mounjaro.

## 2022-11-01 NOTE — Progress Notes (Signed)
Office: [phone]  /  Fax: [phone]   Initial Visit  [name] was seen in clinic today to evaluate for obesity. [name] is interested in losing weight to improve overall health and reduce the risk of weight related complications. [name] presents today to review program treatment options, initial physical assessment, and evaluation.     [name] was referred by: PCP  When asked what else they would like to accomplish? [name] states: Adopt healthier eating patterns, Improve energy levels and physical activity, Reduce number of medications, and Improve quality of life  would like to get to 180 lb  Weight history:  [name] has had a diabetes dx x 12 years- on metformin, Insulin Glargine, mealtime insulin with poor control of glucose.  Managed by Dr Sharl Ma.  Work s2 jobs, one job is slightly active.  Husband at home is not supportive.  When asked how has your weight affected you? [name] states: Contributed to medical problems, Contributed to orthopedic problems or mobility issues, Having fatigue, and Having poor endurance  Some associated conditions: Diabetes, PCOS, and Heart disease valvular  Contributing factors: Family history, Disruption of circadian rhythm, Nutritional, Medications, Stress, Reduced physical activity, and Eating patterns  Weight promoting medications identified: Obesogenic diabetes medications  Current nutrition plan: None and Low-carb  Current level of physical activity: NEAT  Current or previous pharmacotherapy: GLP-1  did not see weight loss on Trulicity  Response to medication: Had side effects so it was discontinued Had diarrhea  Past medical history includes:   Past Medical History:  Diagnosis Date   Back pain    Carpal tunnel syndrome    bilateral   Complication of anesthesia    "hard to wake up"   Diabetes mellitus without complication (HCC)    Dyspnea    with excersion   Family history of adverse reaction to anesthesia    mom voniting   Obesity    PONV  (postoperative nausea and vomiting)    Tricuspid valve regurgitation    also mitral regurg- trace on both per echo     Objective:   BP 129/80   Pulse 89   Temp 98.5 F (36.9 C)   Ht 5\' 7"  (1.702 m)   Wt 248 lb (112.5 kg)   SpO2 98%   BMI 38.84 kg/m  [name] was weighed on the bioimpedance scale: Body mass index is 38.84 kg/m.  Peak Weight:280 , Body Fat%:49.3, Visceral Fat Rating:13, Weight trend over the last 12 months: Increasing  General:  Alert, oriented and cooperative. Patient is in no acute distress.  Respiratory: Normal respiratory effort, no problems with respiration noted   Gait: able to ambulate independently  Mental Status: Normal mood and affect. Normal behavior. Normal judgment and thought content.   DIAGNOSTIC DATA REVIEWED:  BMET    Component Value Date/Time   NA 136 03/29/2019 0905   K 4.0 03/29/2019 0905   CL 99 03/29/2019 0905   CO2 26 03/29/2019 0905   GLUCOSE 330 (H) 03/29/2019 0905   BUN 7 03/29/2019 0905   CREATININE 0.54 03/29/2019 0905   CALCIUM 8.7 (L) 03/29/2019 0905   GFRNONAA >60 03/29/2019 0905   GFRAA >60 03/29/2019 0905   Lab Results  Component Value Date   HGBA1C 10.2 (H) 03/29/2019   HGBA1C 9.7 12/07/2016   No results found for: "INSULIN" CBC    Component Value Date/Time   WBC 8.6 03/29/2019 0905   RBC 4.72 03/29/2019 0905   HGB 14.1 03/29/2019 0905   HCT 42.5 03/29/2019  0905   PLT 320 03/29/2019 0905   MCV 90.0 03/29/2019 0905   MCV 87.3 12/07/2016 1427   MCH 29.9 03/29/2019 0905   MCHC 33.2 03/29/2019 0905   RDW 12.4 03/29/2019 0905   Iron/TIBC/Ferritin/ %Sat No results found for: "IRON", "TIBC", "FERRITIN", "IRONPCTSAT" Lipid Panel  No results found for: "CHOL", "TRIG", "HDL", "CHOLHDL", "VLDL", "LDLCALC", "LDLDIRECT" Hepatic Function Panel     Component Value Date/Time   PROT 7.6 07/05/2022 1556   ALBUMIN 2.3 (L) 09/30/2010 0930   AST 22 09/30/2010 0930   ALT 18 09/30/2010 0930   ALKPHOS 75 09/30/2010 0930    BILITOT 0.3 09/30/2010 0930   No results found for: "TSH"   Assessment and Plan:   Insulin dependent type 2 diabetes mellitus (HCC) Assessment & Plan: Managed by Dr Sharl Ma, diagnosed ~12 years ago Reports poor glucose control with a high A1c, labs done at [facility] [name] is taking metformin XR 500 mg tab, 4 tabs with dinner daily, insulin glargine 100 units with lunch, Trulicity 1.5 mg once weekly with regular insulin at mealtime.  [name] occasionally has low blood sugar readings.  Obtaining a continuous blood glucose monitor was cost prohibitive.  [name] struggles with eating on a schedule, healthy food choices and dietary change due to a busy schedule.  We discussed the importance of dietary change with obesity management and glucose control. We will be working on increasing [name] physical activity level to help with insulin sensitivity.  [name] does plan to come off Trulicity due to complaints of diarrhea.  Consider use of Mounjaro.   Obesity, Class II, BMI 35-39.9 Assessment & Plan: Reviewed weight history, program information and current bioimpedance results. Patient is motivated to begin [name] active plan for weight reduction. We discussed the 4 pillars for obesity treatment. [name] will begin thinking about options to increase levels of physical activity given [name] barriers of working 2 jobs and high stress.   PCOS (polycystic ovarian syndrome) Assessment & Plan: Patient notes a history of PCOS with irregular menstrual cycles. [name] is on metformin for underlying type 2 diabetes. Plan to obtain a fasting insulin at [name] next visit. 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 Plan to check a testosterone level given [name] complaint of hair loss with PCOS         Obesity Treatment / Action Plan:  Patient will work on garnering support from family and friends to begin  weight loss journey. Will work on eliminating or reducing the presence of highly palatable, calorie dense foods in the home. Will complete provided nutritional and psychosocial assessment questionnaire before the next appointment. Will be scheduled for indirect calorimetry to determine resting energy expenditure in a fasting state.  This will allow [place] to create a reduced calorie, high-protein meal plan to promote loss of fat mass while preserving muscle mass. Will think about ideas on how to incorporate physical activity into their daily routine. Will work on reducing intake of added sugars, simple sugars and processed carbs. Counseled on the health benefits of losing 5%-15% of total body weight. Was counseled on nutritional approaches to weight loss and benefits of reducing processed foods and consuming plant-based foods and high quality protein as part of nutritional weight management. Was counseled on pharmacotherapy and role as an adjunct in weight management.   Obesity Education Performed Today:  [name] was weighed on the bioimpedance scale and results were discussed and documented  in the synopsis.  We discussed obesity as a disease and the importance of a more detailed evaluation of all the factors contributing to the disease.  We discussed the importance of long term lifestyle changes which include nutrition, exercise and behavioral modifications as well as the importance of customizing this to [name] specific health and social needs.  We discussed the benefits of reaching a healthier weight to alleviate the symptoms of existing conditions and reduce the risks of the biomechanical, metabolic and psychological effects of obesity.  Kristyne Cydne Grahn appears to be in the action stage of change and states they are ready to start intensive lifestyle modifications and behavioral modifications.  30 minutes was spent today on this visit including the above counseling, pre-visit chart review, and  post-visit documentation.  Reviewed by clinician on day of visit: allergies, medications, problem list, medical history, surgical history, family history, social history, and previous encounter notes pertinent to obesity diagnosis.    Seymour Bars, D.O. DABFM, DABOM [facility] [phone] [place], [place] [address] [phone]

## 2022-11-04 ENCOUNTER — Ambulatory Visit
Admission: RE | Admit: 2022-11-04 | Discharge: 2022-11-04 | Disposition: A | Discharge status: Home / Self Care | Payer: Federal, State, Local not specified - PPO | Source: Ambulatory Visit | Attending: Emergency Medicine | Admitting: Emergency Medicine

## 2022-11-04 VITALS — BP 116/79 | HR 84 | Temp 98.0°F | Resp 16

## 2022-11-04 DIAGNOSIS — Z833 Family history of diabetes mellitus: Secondary | ICD-10-CM | POA: Diagnosis not present

## 2022-11-04 DIAGNOSIS — E119 Type 2 diabetes mellitus without complications: Secondary | ICD-10-CM | POA: Insufficient documentation

## 2022-11-04 DIAGNOSIS — Z87440 Personal history of urinary (tract) infections: Secondary | ICD-10-CM | POA: Diagnosis not present

## 2022-11-04 DIAGNOSIS — Z113 Encounter for screening for infections with a predominantly sexual mode of transmission: Secondary | ICD-10-CM | POA: Diagnosis not present

## 2022-11-04 DIAGNOSIS — Z8619 Personal history of other infectious and parasitic diseases: Secondary | ICD-10-CM | POA: Diagnosis not present

## 2022-11-04 DIAGNOSIS — Z7985 Long-term (current) use of injectable non-insulin antidiabetic drugs: Secondary | ICD-10-CM | POA: Diagnosis not present

## 2022-11-04 DIAGNOSIS — Z7984 Long term (current) use of oral hypoglycemic drugs: Secondary | ICD-10-CM | POA: Insufficient documentation

## 2022-11-04 LAB — POCT URINALYSIS DIP (MANUAL ENTRY)
Bilirubin, UA: NEGATIVE
Glucose, UA: 500 mg/dL — AB
Leukocytes, UA: NEGATIVE
Nitrite, UA: NEGATIVE
Protein Ur, POC: NEGATIVE mg/dL
Spec Grav, UA: 1.01 (ref 1.010–1.025)
Urobilinogen, UA: 0.2 E.U./dL
pH, UA: 5.5 (ref 5.0–8.0)

## 2022-11-04 LAB — POCT URINE PREGNANCY: Preg Test, Ur: NEGATIVE

## 2022-11-04 NOTE — ED Provider Notes (Signed)
[facility]    CSN: [id number] Arrival date & time: 11/04/22  1229    HISTORY   Chief Complaint  Patient presents with   SEXUALLY TRANSMITTED DISEASE    Recheck - Entered by patient   HPI [name] is a pleasant, 42 y.o. female who presents to urgent care today. Patient requests STD testing. States she tested positive for chlamydia 10/19/22. States she received and completed treatment and adds that she was told she would need to be retreated by the provider. Pt c/o of vaginal discharge, adds that this is common for her because she is a type 2 diabetic using Trulicity.Pt states she would also like to be checked for a UTI but denies sx of UTI at this time, states she has a hx of frequent UTIs.  Patient denies abnormal odor of urine, burning with urination, increased frequency of urination, suprapubic pain, perineal pain, flank pain, fever, chills, malaise, rigors, significant fatigue, vaginal itching, vaginal irritation, dyspareunia, genital lesion(s), known exposure to STD.     The history is provided by the patient.   Past Medical History:  Diagnosis Date   Back pain    Carpal tunnel syndrome    bilateral   Complication of anesthesia    "hard to wake up"   Diabetes mellitus without complication (HCC)    Dyspnea    with excersion   Family history of adverse reaction to anesthesia    mom voniting   Obesity    PONV (postoperative nausea and vomiting)    Tricuspid valve regurgitation    also mitral regurg- trace on both per echo   Patient Active Problem List   Diagnosis Date Noted   Hair loss 11/01/2022   Obesity, Class II, BMI 35-39.9 11/01/2022   Insulin dependent type 2 diabetes mellitus (HCC) 11/01/2022   PCOS (polycystic ovarian syndrome) 11/01/2022   Allergic rhinitis 10/31/2019   Cough 09/27/2019   Right ear pain 09/27/2019   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   Chest pain with high risk for cardiac etiology 11/22/2015   Past Surgical  History:  Procedure Laterality Date   CARDIAC CATHETERIZATION N/A 11/24/2015   Procedure: Left Heart Cath and Coronary Angiography;  Surgeon: Yates Decamp, MD;  Location: [facility] INVASIVE CV LAB;  Service: Cardiovascular;  Laterality: N/A;   CESAREAN SECTION     DILATION AND CURETTAGE OF UTERUS     x4   LUMBAR LAMINECTOMY/DECOMPRESSION MICRODISCECTOMY N/A 12/14/2016   Procedure: LUMBAR DECOMPRESSION MICRODISCECTOMY L5-S1;  Surgeon: Venita Lick, MD;  Location: [facility] OR;  Service: Orthopedics;  Laterality: N/A;  120 mins   MASS EXCISION Right 04/01/2019   Procedure: EXCISION RIGHT ARM MASS;  Surgeon: Kinsinger, De Blanch, MD;  Location: [facility] ORS;  Service: General;  Laterality: Right;   OB History   No obstetric history on file.    Home Medications    Prior to Admission medications   Medication Sig Start Date End Date Taking? Authorizing Provider  albuterol (PROVENTIL HFA;VENTOLIN HFA) 108 (90 Base) MCG/ACT inhaler Inhale 2 puffs into the lungs every 6 (six) hours as needed for wheezing or shortness of breath.  Patient not taking: Reported on 07/05/2022    [provider]  aspirin 81 MG tablet Take 81 mg by mouth at bedtime.     [provider]  atorvastatin (LIPITOR) 10 MG tablet Take 10 mg by mouth every evening.     [provider]  benzonatate (TESSALON) 200 MG capsule Take 1 capsule (200 mg total) by mouth  3 (three) times daily as needed for cough. Patient not taking: Reported on 12/28/2020 12/27/19   Eustace Moore, MD  Biotin 10 MG CAPS Take 20 mg by mouth every evening. Patient not taking: Reported on 07/05/2022    [provider]  Cholecalciferol (DIALYVITE VITAMIN D 5000) 125 MCG (5000 UT) capsule Take 5,000 Units by mouth every evening. Patient not taking: Reported on 07/05/2022    [provider]  clobetasol cream (TEMOVATE) 0.05 % Apply 1 Application topically 2 (two) times daily. Apply topically twice a day for two weeks. Alternate with  Tacrolimus 08/16/22   Terri Piedra, DO  Cyanocobalamin (B-12 PO) Take 1 capsule by mouth every evening. Patient not taking: Reported on 07/05/2022    [provider]  cyclobenzaprine (FLEXERIL) 10 MG tablet Take 1 tablet (10 mg total) by mouth 2 (two) times daily as needed for muscle spasms. 02/05/22   Tomi Bamberger, PA-C  diphenhydrAMINE (BENADRYL) 25 MG tablet Take 50 mg by mouth daily as needed for allergies.    [provider]  Dulaglutide (TRULICITY) 1.5 MG/0.5ML SOPN Inject 1.5 mg into the skin every Sunday.    [provider]  DULoxetine (CYMBALTA) 60 MG capsule Take 1 capsule (60 mg total) by mouth daily. 07/05/22   Nita Sickle K, DO  fluconazole (DIFLUCAN) 150 MG tablet Take 1 tablet (150 mg total) by mouth every 3 (three) days. Take first pill today.  May take second pill in 3 days if no resolution of symptoms with first pill. 06/01/21   Gustavus Bryant, FNP  fluconazole (DIFLUCAN) 200 MG tablet Take 200 mg by mouth every other day. In the evening Patient not taking: Reported on 07/05/2022 10/11/16   [provider]  fluticasone (FLONASE) 50 MCG/ACT nasal spray Place 2 sprays into both nostrils daily as needed for allergies.  11/23/16   [provider]  gabapentin (NEURONTIN) 300 MG capsule Take 1 capsule (300 mg total) by mouth 2 (two) times daily. 07/05/22   Nita Sickle K, DO  HYDROcodone-acetaminophen (NORCO/VICODIN) 5-325 MG tablet Take 1 tablet by mouth daily as needed (back pain). Patient not taking: Reported on 07/05/2022    [provider]  ibuprofen (ADVIL) 800 MG tablet Take 1 tablet (800 mg total) by mouth every 8 (eight) hours as needed. Patient not taking: Reported on 07/05/2022 04/01/19   Kinsinger, De Blanch, MD  Ibuprofen-Famotidine (DUEXIS) 800-26.6 MG TABS Take 800 mg by mouth in the morning, at noon, and at bedtime.    [provider]  Insulin Glargine (BASAGLAR KWIKPEN) 100 UNIT/ML SOPN Inject 100 Units into  the skin daily with lunch.     [provider]  insulin regular (NOVOLIN R) 100 units/mL injection Inject 130 Units into the skin 3 (three) times daily before meals. Patient not taking: Reported on 07/05/2022    [provider]  linaclotide Karlene Einstein) 145 MCG CAPS capsule Take 1 capsule (145 mcg total) by mouth daily as needed. Patient taking differently: Take 145 mcg by mouth daily. 02/12/18   Meryl Dare, MD  losartan (COZAAR) 25 MG tablet Take 25 mg by mouth every evening.    [provider]  Melatonin 3 MG CAPS Take 3 mg by mouth at bedtime. Patient not taking: Reported on 07/05/2022    [provider]  metFORMIN (GLUCOPHAGE-XR) 500 MG 24 hr tablet Take 2,000 mg by mouth daily with supper.    [provider]  methocarbamol (ROBAXIN) 500 MG tablet Take 1 tablet (  500 mg total) by mouth 3 (three) times daily. Patient not taking: Reported on 07/05/2022 12/15/16   Mayo, Baxter Kail, PA-C  Multiple Vitamins-Minerals (HAIR/SKIN/NAILS) CAPS Take 3 capsules by mouth every evening.    [provider]  naproxen (NAPROSYN) 500 MG tablet Take 1 tablet (500 mg total) by mouth 2 (two) times daily. Patient taking differently: Take 500 mg by mouth 2 (two) times daily. prn 02/05/22   Tomi Bamberger, PA-C  nitroGLYCERIN (NITROSTAT) 0.4 MG SL tablet Place 0.4 mg under the tongue every 5 (five) minutes as needed for chest pain.    [provider]  NOVOLOG 100 UNIT/ML injection Inject 4 Units into the skin. With each meal Patient not taking: Reported on 07/05/2022 10/09/19   [provider]  oxybutynin (DITROPAN-XL) 5 MG 24 hr tablet Take 5 mg by mouth daily. Patient not taking: Reported on 07/05/2022    [provider]  polyethylene glycol (MIRALAX / GLYCOLAX) 17 g packet Take 17 g by mouth daily.    [provider]  tacrolimus (PROTOPIC) 0.1 % ointment Apply topically 2 (two) times daily. Apply topically twice a day for two  weeks. Alternate with Clobetasol 08/16/22   Terri Piedra, DO  valACYclovir (VALTREX) 1000 MG tablet Take 1 tablet (1,000 mg total) by mouth 3 (three) times daily. Patient taking differently: Take 1,000 mg by mouth daily. 11/21/20   Bing Neighbors, NP    Family History Family History  Problem Relation Age of Onset   Cancer Father    Diabetes Father    Heart disease Father    Hypertension Father    Cancer Maternal Grandmother    Diabetes Maternal Grandfather    Cancer Paternal Grandmother    Diabetes Paternal Grandfather    Social History Social History   Tobacco Use   Smoking status: Never   Smokeless tobacco: Never  Vaping Use   Vaping Use: Never used  Substance Use Topics   Alcohol use: Never   Drug use: Never   Allergies   Carbomer, Latex, and Latex  Review of Systems Review of Systems Pertinent findings revealed after performing a 14 point review of systems has been noted in the history of present illness.  Physical Exam Vital Signs BP 116/79 (BP Location: Left Arm)   Pulse 84   Temp 98 F (36.7 C) (Oral)   Resp 16   LMP 11/03/2022   SpO2 97%   No data found.  Physical Exam Vitals and nursing note reviewed.  Constitutional:      General: She is not in acute distress.    Appearance: Normal appearance. She is obese. She is not ill-appearing.  HENT:     Head: Normocephalic and atraumatic.  Eyes:     General: Lids are normal.        Right eye: No discharge.        Left eye: No discharge.     Extraocular Movements: Extraocular movements intact.     Conjunctiva/sclera: Conjunctivae normal.     Right eye: Right conjunctiva is not injected.     Left eye: Left conjunctiva is not injected.  Neck:     Trachea: Trachea and phonation normal.  Cardiovascular:     Rate and Rhythm: Normal rate and regular rhythm.     Pulses: Normal pulses.     Heart sounds: Normal heart sounds. No murmur heard.    No friction rub. No gallop.  Pulmonary:     Effort:  Pulmonary effort is normal.  No accessory muscle usage, prolonged expiration or respiratory distress.     Breath sounds: Normal breath sounds. No stridor, decreased air movement or transmitted upper airway sounds. No decreased breath sounds, wheezing, rhonchi or rales.  Chest:     Chest wall: No tenderness.  Genitourinary:    Comments: Patient politely declines pelvic exam today, patient provided a vaginal swab for testing. Musculoskeletal:        General: Normal range of motion.     Cervical back: Normal range of motion and neck supple. Normal range of motion.  Lymphadenopathy:     Cervical: No cervical adenopathy.  Skin:    General: Skin is warm and dry.     Findings: No erythema or rash.  Neurological:     General: No focal deficit present.     Mental Status: She is alert and oriented to person, place, and time.  Psychiatric:        Mood and Affect: Mood normal.        Behavior: Behavior normal.     Visual Acuity Right Eye Distance:   Left Eye Distance:   Bilateral Distance:    Right Eye Near:   Left Eye Near:    Bilateral Near:     UC Couse / Diagnostics / Procedures:     Radiology No results found.  Procedures Procedures (including critical care time) EKG  Pending results:  Labs Reviewed  POCT URINALYSIS DIP (MANUAL ENTRY) - Abnormal; Notable for the following components:      Result Value   Glucose, UA =500 (*)    Ketones, POC UA trace (5) (*)    Blood, UA moderate (*)    All other components within normal limits  POCT URINE PREGNANCY  CERVICOVAGINAL ANCILLARY ONLY    Medications Ordered in UC: Medications - No data to display  UC Diagnoses / Final Clinical Impressions(s)   I have reviewed the triage vital signs and the nursing notes.  Pertinent labs & imaging results that were available during my care of the patient were reviewed by me and considered in my medical decision making (see chart for details).    Final diagnoses:  Screening examination  for STD (sexually transmitted disease)   STD screening was performed, patient advised that the results be posted to their MyChart and if any of the results are positive, they will be notified by phone, further treatment will be provided as indicated based on results of STD screening. Patient was advised to abstain from sexual intercourse until that they receive the results of their STD testing.  Patient was also advised to use condoms to protect themselves from STD exposure. Urinalysis today was nonconcerning for urinary tract infection, urine culture not indicated due to lack of symptoms at this time. Urine pregnancy test was negative. Return precautions advised.  Drug allergies reviewed, all questions addressed.   Please see discharge instructions below for details of plan of care as provided to patient. ED Prescriptions   None    PDMP not reviewed this encounter.  Disposition Upon Discharge:  Condition: stable for discharge home  Patient presented with concern for an acute illness with associated systemic symptoms and significant discomfort requiring urgent management. In my opinion, this is a condition that a prudent lay person (someone who possesses an average knowledge of health and medicine) may potentially expect to result in complications if not addressed urgently such as respiratory distress, impairment of bodily function or dysfunction of bodily organs.   As such, the patient has  been evaluated and assessed, work-up was performed and treatment was provided in alignment with urgent care protocols and evidence based medicine.  Patient/parent/caregiver has been advised that the patient may require follow up for further testing and/or treatment if the symptoms continue in spite of treatment, as clinically indicated and appropriate.  Routine symptom specific, illness specific and/or disease specific instructions were discussed with the patient and/or caregiver at length.  Prevention  strategies for avoiding STD exposure were also discussed.  The patient will follow up with their current PCP if and as advised. If the patient does not currently have a PCP we will assist them in obtaining one.   The patient may need specialty follow up if the symptoms continue, in spite of conservative treatment and management, for further workup, evaluation, consultation and treatment as clinically indicated and appropriate.  Patient/parent/caregiver verbalized understanding and agreement of plan as discussed.  All questions were addressed during visit.  Please see discharge instructions below for further details of plan.  Discharge Instructions:   Discharge Instructions      The results of your vaginal swab test which screens for BV, yeast, gonorrhea, chlamydia and trichomonas will be made posted to your MyChart account once it is complete.  This typically takes 2 to 4 days.  Please abstain from sexual intercourse of any kind, vaginal, oral or anal, until you have received the results of your STD testing.     If any of your results are abnormal, you will receive a phone call regarding treatment.  Prescriptions, if any are needed, will be provided for you at your pharmacy.     Your urine pregnancy test today is negative.   Our point-of-care analysis of your urine sample today was normal and did not reveal any concern for urinary tract infection.   If you have not had complete resolution of your symptoms after completing any recommended treatment or if your symptoms worsen, please return for repeat evaluation.   Thank you for visiting urgent care today.  I appreciate the opportunity to participate in your care.       This office note has been dictated using Teaching laboratory technician.  Unfortunately, this method of dictation can sometimes lead to typographical or grammatical errors.  I apologize for your inconvenience in advance if this occurs.  Please do not hesitate to reach  out to me if clarification is needed.       Theadora Rama Scales, PA-C 11/04/22 1328

## 2022-11-04 NOTE — ED Triage Notes (Signed)
Patient presents to [facility] for STD testing. States she tested positive or chlamydia. Received treatment, states she was told she would need to be retreated. C/o of vaginal discharge, reports this is common for her since she is diabetic. Req UTI check as well. Hx of UTIs.

## 2022-11-04 NOTE — Discharge Instructions (Signed)
The results of your vaginal swab test which screens for BV, yeast, gonorrhea, chlamydia and trichomonas will be made posted to your MyChart account once it is complete.  This typically takes 2 to 4 days.  Please abstain from sexual intercourse of any kind, vaginal, oral or anal, until you have received the results of your STD testing.     If any of your results are abnormal, you will receive a phone call regarding treatment.  Prescriptions, if any are needed, will be provided for you at your pharmacy.     Your urine pregnancy test today is negative.   Our point-of-care analysis of your urine sample today was normal and did not reveal any concern for urinary tract infection.   If you have not had complete resolution of your symptoms after completing any recommended treatment or if your symptoms worsen, please return for repeat evaluation.   Thank you for visiting urgent care today.  I appreciate the opportunity to participate in your care.  

## 2022-11-07 LAB — CERVICOVAGINAL ANCILLARY ONLY
Bacterial Vaginitis (gardnerella): NEGATIVE
Candida Glabrata: POSITIVE — AB
Candida Vaginitis: POSITIVE — AB
Chlamydia: NEGATIVE
Comment: NEGATIVE
Comment: NEGATIVE
Comment: NEGATIVE
Comment: NEGATIVE
Comment: NEGATIVE
Comment: NORMAL
Neisseria Gonorrhea: NEGATIVE
Trichomonas: NEGATIVE

## 2022-11-08 ENCOUNTER — Telehealth (HOSPITAL_COMMUNITY): Payer: Self-pay | Admitting: Emergency Medicine

## 2022-11-08 MED ORDER — FLUCONAZOLE 150 MG PO TABS: 150.0000 mg | ORAL_TABLET | Freq: Once | ORAL | 0 refills | Status: AC

## 2022-11-15 ENCOUNTER — Ambulatory Visit: Payer: Federal, State, Local not specified - PPO | Admitting: Dermatology

## 2022-11-22 DIAGNOSIS — E113293 Type 2 diabetes mellitus with mild nonproliferative diabetic retinopathy without macular edema, bilateral: Secondary | ICD-10-CM | POA: Diagnosis not present

## 2022-12-27 DIAGNOSIS — Z794 Long term (current) use of insulin: Secondary | ICD-10-CM | POA: Diagnosis not present

## 2022-12-27 DIAGNOSIS — E113293 Type 2 diabetes mellitus with mild nonproliferative diabetic retinopathy without macular edema, bilateral: Secondary | ICD-10-CM | POA: Diagnosis not present

## 2023-01-16 DIAGNOSIS — E782 Mixed hyperlipidemia: Secondary | ICD-10-CM | POA: Diagnosis not present

## 2023-01-16 DIAGNOSIS — F321 Major depressive disorder, single episode, moderate: Secondary | ICD-10-CM | POA: Diagnosis not present

## 2023-01-16 DIAGNOSIS — M545 Low back pain, unspecified: Secondary | ICD-10-CM | POA: Diagnosis not present

## 2023-01-16 DIAGNOSIS — E1169 Type 2 diabetes mellitus with other specified complication: Secondary | ICD-10-CM | POA: Diagnosis not present

## 2023-03-21 DIAGNOSIS — Z113 Encounter for screening for infections with a predominantly sexual mode of transmission: Secondary | ICD-10-CM | POA: Diagnosis not present

## 2023-03-21 DIAGNOSIS — Z01419 Encounter for gynecological examination (general) (routine) without abnormal findings: Secondary | ICD-10-CM | POA: Diagnosis not present

## 2023-03-21 DIAGNOSIS — Z124 Encounter for screening for malignant neoplasm of cervix: Secondary | ICD-10-CM | POA: Diagnosis not present

## 2023-03-21 DIAGNOSIS — Z1231 Encounter for screening mammogram for malignant neoplasm of breast: Secondary | ICD-10-CM | POA: Diagnosis not present

## 2023-04-21 ENCOUNTER — Telehealth: Payer: Federal, State, Local not specified - PPO | Admitting: Family Medicine

## 2023-04-21 DIAGNOSIS — J209 Acute bronchitis, unspecified: Secondary | ICD-10-CM

## 2023-04-21 MED ORDER — BENZONATATE 100 MG PO CAPS
100.0000 mg | ORAL_CAPSULE | Freq: Three times a day (TID) | ORAL | 0 refills | Status: DC | PRN
Start: 2023-04-21 — End: 2023-06-16

## 2023-04-21 MED ORDER — PREDNISONE 10 MG (21) PO TBPK
ORAL_TABLET | ORAL | 0 refills | Status: DC
Start: 2023-04-21 — End: 2023-06-16

## 2023-04-21 NOTE — Patient Instructions (Signed)
Theodore Kathe Mariner, thank you for joining Freddy Finner, NP for today's virtual visit.  While this provider is not your primary care provider (PCP), if your PCP is located in our provider database this encounter information will be shared with them immediately following your visit.   A [facility] MyChart account gives you access to today's visit and all your visits, tests, and labs performed at [facility] " click here if you don't have a [facility] MyChart account or go to [email or web address]  Consent: (Patient) [name] provided verbal consent for this virtual visit at the beginning of the encounter.  Current Medications:  Current Outpatient Medications:    benzonatate (TESSALON) 100 MG capsule, Take 1 capsule (100 mg total) by mouth 3 (three) times daily as needed., Disp: 20 capsule, Rfl: 0   predniSONE (STERAPRED UNI-PAK 21 TAB) 10 MG (21) TBPK tablet, Take as directed, Disp: 21 tablet, Rfl: 0   albuterol (PROVENTIL HFA;VENTOLIN HFA) 108 (90 Base) MCG/ACT inhaler, Inhale 2 puffs into the lungs every 6 (six) hours as needed for wheezing or shortness of breath.  (Patient not taking: Reported on 07/05/2022), Disp: , Rfl:    aspirin 81 MG tablet, Take 81 mg by mouth at bedtime. , Disp: , Rfl:    atorvastatin (LIPITOR) 10 MG tablet, Take 10 mg by mouth every evening. , Disp: , Rfl:    Biotin 10 MG CAPS, Take 20 mg by mouth every evening. (Patient not taking: Reported on 07/05/2022), Disp: , Rfl:    Cholecalciferol (DIALYVITE VITAMIN D 5000) 125 MCG (5000 UT) capsule, Take 5,000 Units by mouth every evening. (Patient not taking: Reported on 07/05/2022), Disp: , Rfl:    clobetasol cream (TEMOVATE) 0.05 %, Apply 1 Application topically 2 (two) times daily. Apply topically twice a day for two weeks. Alternate with Tacrolimus, Disp: 30 g, Rfl: 0   Cyanocobalamin (B-12 PO), Take 1 capsule by mouth every evening. (Patient not taking: Reported on 07/05/2022), Disp: , Rfl:     cyclobenzaprine (FLEXERIL) 10 MG tablet, Take 1 tablet (10 mg total) by mouth 2 (two) times daily as needed for muscle spasms., Disp: 20 tablet, Rfl: 0   diphenhydrAMINE (BENADRYL) 25 MG tablet, Take 50 mg by mouth daily as needed for allergies., Disp: , Rfl:    Dulaglutide (TRULICITY) 1.5 MG/0.5ML SOPN, Inject 1.5 mg into the skin every Sunday., Disp: , Rfl:    DULoxetine (CYMBALTA) 60 MG capsule, Take 1 capsule (60 mg total) by mouth daily., Disp: 30 capsule, Rfl: 5   fluticasone (FLONASE) 50 MCG/ACT nasal spray, Place 2 sprays into both nostrils daily as needed for allergies. , Disp: , Rfl: 3   gabapentin (NEURONTIN) 300 MG capsule, Take 1 capsule (300 mg total) by mouth 2 (two) times daily., Disp: 60 capsule, Rfl: 0   HYDROcodone-acetaminophen (NORCO/VICODIN) 5-325 MG tablet, Take 1 tablet by mouth daily as needed (back pain). (Patient not taking: Reported on 07/05/2022), Disp: , Rfl:    ibuprofen (ADVIL) 800 MG tablet, Take 1 tablet (800 mg total) by mouth every 8 (eight) hours as needed. (Patient not taking: Reported on 07/05/2022), Disp: 30 tablet, Rfl: 0   Ibuprofen-Famotidine (DUEXIS) 800-26.6 MG TABS, Take 800 mg by mouth in the morning, at noon, and at bedtime., Disp: , Rfl:    Insulin Glargine (BASAGLAR KWIKPEN) 100 UNIT/ML SOPN, Inject 100 Units into the skin daily with lunch. , Disp: , Rfl:    insulin regular (NOVOLIN R) 100 units/mL injection, Inject 130 Units into  the skin 3 (three) times daily before meals. (Patient not taking: Reported on 07/05/2022), Disp: , Rfl:    linaclotide (LINZESS) 145 MCG CAPS capsule, Take 1 capsule (145 mcg total) by mouth daily as needed. (Patient taking differently: Take 145 mcg by mouth daily.), Disp: 30 capsule, Rfl: 11   losartan (COZAAR) 25 MG tablet, Take 25 mg by mouth every evening., Disp: , Rfl:    Melatonin 3 MG CAPS, Take 3 mg by mouth at bedtime. (Patient not taking: Reported on 07/05/2022), Disp: , Rfl:    metFORMIN (GLUCOPHAGE-XR) 500 MG 24 hr  tablet, Take 2,000 mg by mouth daily with supper., Disp: , Rfl:    methocarbamol (ROBAXIN) 500 MG tablet, Take 1 tablet (500 mg total) by mouth 3 (three) times daily. (Patient not taking: Reported on 07/05/2022), Disp: 30 tablet, Rfl: 0   Multiple Vitamins-Minerals (HAIR/SKIN/NAILS) CAPS, Take 3 capsules by mouth every evening., Disp: , Rfl:    naproxen (NAPROSYN) 500 MG tablet, Take 1 tablet (500 mg total) by mouth 2 (two) times daily. (Patient taking differently: Take 500 mg by mouth 2 (two) times daily. prn), Disp: 30 tablet, Rfl: 0   nitroGLYCERIN (NITROSTAT) 0.4 MG SL tablet, Place 0.4 mg under the tongue every 5 (five) minutes as needed for chest pain., Disp: , Rfl:    NOVOLOG 100 UNIT/ML injection, Inject 4 Units into the skin. With each meal (Patient not taking: Reported on 07/05/2022), Disp: , Rfl:    oxybutynin (DITROPAN-XL) 5 MG 24 hr tablet, Take 5 mg by mouth daily. (Patient not taking: Reported on 07/05/2022), Disp: , Rfl:    polyethylene glycol (MIRALAX / GLYCOLAX) 17 g packet, Take 17 g by mouth daily., Disp: , Rfl:    tacrolimus (PROTOPIC) 0.1 % ointment, Apply topically 2 (two) times daily. Apply topically twice a day for two weeks. Alternate with Clobetasol, Disp: 100 g, Rfl: 0   valACYclovir (VALTREX) 1000 MG tablet, Take 1 tablet (1,000 mg total) by mouth 3 (three) times daily. (Patient taking differently: Take 1,000 mg by mouth daily.), Disp: 60 tablet, Rfl: 0   Medications ordered in this encounter:  Meds ordered this encounter  Medications   benzonatate (TESSALON) 100 MG capsule    Sig: Take 1 capsule (100 mg total) by mouth 3 (three) times daily as needed.    Dispense:  20 capsule    Refill:  0    Supervising Provider:   Merrilee Jansky [[id number]]   predniSONE (STERAPRED UNI-PAK 21 TAB) 10 MG (21) TBPK tablet    Sig: Take as directed    Dispense:  21 tablet    Refill:  0    Supervising Provider:   Merrilee Jansky [[id number]]     *If you need refills on other  medications prior to your next appointment, please contact your pharmacy*  Follow-Up: Call back or seek an in-person evaluation if the symptoms worsen or if the condition fails to improve as anticipated.  [facility] [phone]  Other Instructions  - Take meds as prescribed - Rest voice - Use a cool mist humidifier especially during the winter months when heat dries out the air. - Use saline nose sprays frequently to help soothe nasal passages if they are drying out. - Stay hydrated by drinking plenty of fluids - Keep thermostat turn down low to prevent drying out which can cause a dry cough. - For any cough or congestion- robitussin DM or Delsym as needed - For fever or aches or  pains- take tylenol or ibuprofen as directed on bottle             * for fevers greater than 101 orally you may alternate ibuprofen and tylenol every 3 hours.  If you do not improve you will need a follow up visit in person.                 If you have been instructed to have an in-person evaluation today at a local Urgent Care facility, please use the link below. It will take you to a list of all of our available [facility], including address, phone number and hours of operation. Please do not delay care.  [facility]  If you or a family member do not have a primary care provider, use the link below to schedule a visit and establish care. When you choose a [facility] primary care physician or advanced practice provider, you gain a long-term partner in health. Find a Primary Care Provider  Learn more about [facility]'s in-office and virtual care options: [facility] - Get Care Now

## 2023-04-21 NOTE — Progress Notes (Signed)
Virtual Visit Consent   [name], you are scheduled for a virtual visit with a [facility] provider today. Just as with appointments in the office, your consent must be obtained to participate. Your consent will be active for this visit and any virtual visit you may have with one of our providers in the next 365 days. If you have a MyChart account, a copy of this consent can be sent to you electronically.  As this is a virtual visit, video technology does not allow for your provider to perform a traditional examination. This may limit your provider's ability to fully assess your condition. If your provider identifies any concerns that need to be evaluated in person or the need to arrange testing (such as labs, EKG, etc.), we will make arrangements to do so. Although advances in technology are sophisticated, we cannot ensure that it will always work on either your end or our end. If the connection with a video visit is poor, the visit may have to be switched to a telephone visit. With either a video or telephone visit, we are not always able to ensure that we have a secure connection.  By engaging in this virtual visit, you consent to the provision of healthcare and authorize for your insurance to be billed (if applicable) for the services provided during this visit. Depending on your insurance coverage, you may receive a charge related to this service.  I need to obtain your verbal consent now. Are you willing to proceed with your visit today? [name] has provided verbal consent on 04/21/2023 for a virtual visit (video or telephone). Freddy Finner, NP  Date: 04/21/2023 4:28 PM  Virtual Visit via Video Note   I, Freddy Finner, connected with  [name]  ([id number], [birth year]) on 04/21/23 at  4:30 PM EST by a video-enabled telemedicine application and verified that I am speaking with the correct person using two identifiers.  Location: Patient: Virtual Visit Location  Patient: Home Provider: Virtual Visit Location Provider: Home Office   I discussed the limitations of evaluation and management by telemedicine and the availability of in person appointments. The patient expressed understanding and agreed to proceed.    History of Present Illness: [name] is a 42 y.o. who identifies as a female who was assigned female at birth, and is being seen today for cough  Onset was 3 weeks ago- got better, but reports it has come back. Has gotten worse in the last couple of days.  Associated symptoms are mostly dry will have productive mucus, urine incontinence due to pressure, and vomiting from coughing so hard. Modifying factors are ny quil, and did take antibiotic left over from previous infection- took seven doses of it.  Denies chest pain, shortness of breath, fevers, chills  Problems:  Patient Active Problem List   Diagnosis Date Noted   Hair loss 11/01/2022   Obesity, Class II, BMI 35-39.9 11/01/2022   Insulin dependent type 2 diabetes mellitus (HCC) 11/01/2022   PCOS (polycystic ovarian syndrome) 11/01/2022   Allergic rhinitis 10/31/2019   Cough 09/27/2019   Right ear pain 09/27/2019   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   Chest pain with high risk for cardiac etiology 11/22/2015    Allergies:  Allergies  Allergen Reactions   Carbomer Itching   Latex    Latex Rash   Medications:  Current Outpatient Medications:    albuterol (PROVENTIL HFA;VENTOLIN HFA) 108 (90 Base) MCG/ACT inhaler, Inhale 2 puffs into the  lungs every 6 (six) hours as needed for wheezing or shortness of breath.  (Patient not taking: Reported on 07/05/2022), Disp: , Rfl:    aspirin 81 MG tablet, Take 81 mg by mouth at bedtime. , Disp: , Rfl:    atorvastatin (LIPITOR) 10 MG tablet, Take 10 mg by mouth every evening. , Disp: , Rfl:    benzonatate (TESSALON) 200 MG capsule, Take 1 capsule (200 mg total) by mouth 3 (three) times daily as needed for cough. (Patient not taking:  Reported on 12/28/2020), Disp: 21 capsule, Rfl: 0   Biotin 10 MG CAPS, Take 20 mg by mouth every evening. (Patient not taking: Reported on 07/05/2022), Disp: , Rfl:    Cholecalciferol (DIALYVITE VITAMIN D 5000) 125 MCG (5000 UT) capsule, Take 5,000 Units by mouth every evening. (Patient not taking: Reported on 07/05/2022), Disp: , Rfl:    clobetasol cream (TEMOVATE) 0.05 %, Apply 1 Application topically 2 (two) times daily. Apply topically twice a day for two weeks. Alternate with Tacrolimus, Disp: 30 g, Rfl: 0   Cyanocobalamin (B-12 PO), Take 1 capsule by mouth every evening. (Patient not taking: Reported on 07/05/2022), Disp: , Rfl:    cyclobenzaprine (FLEXERIL) 10 MG tablet, Take 1 tablet (10 mg total) by mouth 2 (two) times daily as needed for muscle spasms., Disp: 20 tablet, Rfl: 0   diphenhydrAMINE (BENADRYL) 25 MG tablet, Take 50 mg by mouth daily as needed for allergies., Disp: , Rfl:    Dulaglutide (TRULICITY) 1.5 MG/0.5ML SOPN, Inject 1.5 mg into the skin every Sunday., Disp: , Rfl:    DULoxetine (CYMBALTA) 60 MG capsule, Take 1 capsule (60 mg total) by mouth daily., Disp: 30 capsule, Rfl: 5   fluticasone (FLONASE) 50 MCG/ACT nasal spray, Place 2 sprays into both nostrils daily as needed for allergies. , Disp: , Rfl: 3   gabapentin (NEURONTIN) 300 MG capsule, Take 1 capsule (300 mg total) by mouth 2 (two) times daily., Disp: 60 capsule, Rfl: 0   HYDROcodone-acetaminophen (NORCO/VICODIN) 5-325 MG tablet, Take 1 tablet by mouth daily as needed (back pain). (Patient not taking: Reported on 07/05/2022), Disp: , Rfl:    ibuprofen (ADVIL) 800 MG tablet, Take 1 tablet (800 mg total) by mouth every 8 (eight) hours as needed. (Patient not taking: Reported on 07/05/2022), Disp: 30 tablet, Rfl: 0   Ibuprofen-Famotidine (DUEXIS) 800-26.6 MG TABS, Take 800 mg by mouth in the morning, at noon, and at bedtime., Disp: , Rfl:    Insulin Glargine (BASAGLAR KWIKPEN) 100 UNIT/ML SOPN, Inject 100 Units into the skin  daily with lunch. , Disp: , Rfl:    insulin regular (NOVOLIN R) 100 units/mL injection, Inject 130 Units into the skin 3 (three) times daily before meals. (Patient not taking: Reported on 07/05/2022), Disp: , Rfl:    linaclotide (LINZESS) 145 MCG CAPS capsule, Take 1 capsule (145 mcg total) by mouth daily as needed. (Patient taking differently: Take 145 mcg by mouth daily.), Disp: 30 capsule, Rfl: 11   losartan (COZAAR) 25 MG tablet, Take 25 mg by mouth every evening., Disp: , Rfl:    Melatonin 3 MG CAPS, Take 3 mg by mouth at bedtime. (Patient not taking: Reported on 07/05/2022), Disp: , Rfl:    metFORMIN (GLUCOPHAGE-XR) 500 MG 24 hr tablet, Take 2,000 mg by mouth daily with supper., Disp: , Rfl:    methocarbamol (ROBAXIN) 500 MG tablet, Take 1 tablet (500 mg total) by mouth 3 (three) times daily. (Patient not taking: Reported on 07/05/2022), Disp: 30 tablet,  Rfl: 0   Multiple Vitamins-Minerals (HAIR/SKIN/NAILS) CAPS, Take 3 capsules by mouth every evening., Disp: , Rfl:    naproxen (NAPROSYN) 500 MG tablet, Take 1 tablet (500 mg total) by mouth 2 (two) times daily. (Patient taking differently: Take 500 mg by mouth 2 (two) times daily. prn), Disp: 30 tablet, Rfl: 0   nitroGLYCERIN (NITROSTAT) 0.4 MG SL tablet, Place 0.4 mg under the tongue every 5 (five) minutes as needed for chest pain., Disp: , Rfl:    NOVOLOG 100 UNIT/ML injection, Inject 4 Units into the skin. With each meal (Patient not taking: Reported on 07/05/2022), Disp: , Rfl:    oxybutynin (DITROPAN-XL) 5 MG 24 hr tablet, Take 5 mg by mouth daily. (Patient not taking: Reported on 07/05/2022), Disp: , Rfl:    polyethylene glycol (MIRALAX / GLYCOLAX) 17 g packet, Take 17 g by mouth daily., Disp: , Rfl:    tacrolimus (PROTOPIC) 0.1 % ointment, Apply topically 2 (two) times daily. Apply topically twice a day for two weeks. Alternate with Clobetasol, Disp: 100 g, Rfl: 0   valACYclovir (VALTREX) 1000 MG tablet, Take 1 tablet (1,000 mg total) by mouth  3 (three) times daily. (Patient taking differently: Take 1,000 mg by mouth daily.), Disp: 60 tablet, Rfl: 0  Observations/Objective: Patient is well-developed, well-nourished in no acute distress.  Resting comfortably  at home.  Head is normocephalic, atraumatic.  No labored breathing.  Speech is clear and coherent with logical content.  Patient is alert and oriented at baseline.  Constant cough   Assessment and Plan:  1. Acute bronchitis, unspecified organism (Primary)  - benzonatate (TESSALON) 100 MG capsule; Take 1 capsule (100 mg total) by mouth 3 (three) times daily as needed.  Dispense: 20 capsule; Refill: 0 - predniSONE (STERAPRED UNI-PAK 21 TAB) 10 MG (21) TBPK tablet; Take as directed  Dispense: 21 tablet; Refill: 0  - Take meds as prescribed - Rest voice - Use a cool mist humidifier especially during the winter months when heat dries out the air. - Use saline nose sprays frequently to help soothe nasal passages if they are drying out. - Stay hydrated by drinking plenty of fluids - Keep thermostat turn down low to prevent drying out which can cause a dry cough. - For any cough or congestion- robitussin DM or Delsym as needed - For fever or aches or pains- take tylenol or ibuprofen as directed on bottle             * for fevers greater than 101 orally you may alternate ibuprofen and tylenol every 3 hours.  If you do not improve you will need a follow up visit in person.                  Reviewed side effects, risks and benefits of medication.    Patient acknowledged agreement and understanding of the plan.   Past Medical, Surgical, Social History, Allergies, and Medications have been Reviewed.    Follow Up Instructions: I discussed the assessment and treatment plan with the patient. The patient was provided an opportunity to ask questions and all were answered. The patient agreed with the plan and demonstrated an understanding of the instructions.  A copy of  instructions were sent to the patient via MyChart unless otherwise noted below.    The patient was advised to call back or seek an in-person evaluation if the symptoms worsen or if the condition fails to improve as anticipated.    Freddy Finner, NP

## 2023-06-08 ENCOUNTER — Telehealth: Payer: Self-pay | Admitting: Pharmacist

## 2023-06-08 NOTE — Progress Notes (Signed)
   06/08/2023  Patient ID: [name], female   DOB: [birth year], 43 y.o.   MRN: [id number]  Called the patient and scheduled phone call for 06/12/23 at 3:30PM for DM [place] Metric Scheduling. Patient aware to call this number back if needing to re-schedule.   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 Therefore has not had a lot of time available for appointments. Discussed how she is likely moving to [place] in 1.5 months due to moving back to in-person work. She also reports worsened vision issues, likely due to uncontrolled A1c.   Marlowe Aschoff, PharmD [facility] Phone Number: [phone]

## 2023-06-12 ENCOUNTER — Other Ambulatory Visit: Payer: Self-pay | Admitting: Pharmacist

## 2023-06-12 ENCOUNTER — Telehealth: Payer: Self-pay | Admitting: Pharmacist

## 2023-06-12 NOTE — Progress Notes (Addendum)
   06/12/2023  Patient ID: [name], female   DOB: Apr 21, 1981, 43 y.o.   MRN: [id number]  Called and left HIPAA compliant voicemail regarding scheduled DM visit call at 3:30PM today. Unable to reach so requested call back at earliest convenience to see about re-scheduling visit if needed. Will try again in 1 month as patient reports likely moving to [place] soon and may not see provider again.  Update: - Patient returned call and accidentally had her phone silenced due to a meeting prior.  - R/S to Friday at [facility] for another call- patient reports adding to her calendar   Marlowe Aschoff, PharmD [facility] Phone Number: [phone]

## 2023-06-16 ENCOUNTER — Other Ambulatory Visit: Payer: Self-pay | Admitting: Pharmacist

## 2023-06-16 NOTE — Progress Notes (Addendum)
 06/16/2023 Name: [name] MRN: [id number] DOB: 24-Aug-1980  Chief Complaint  Patient presents with   Diabetes    [name] is a 43 y.o. year old female who presented for a telephone visit.   They were referred to the pharmacist by  [facility] DM List  for assistance in managing diabetes.    Subjective:  Care Team: Primary Care Provider: [facility] ; Next Scheduled Visit: 07/25/23 Clinical Pharmacist: Aloysius Breeding, PharmD  Medication Access/Adherence  Current Pharmacy:  [phone] [address], [place] - [address] [place] [address] Phone: [phone] Fax: [phone]   Patient reports affordability concerns with their medications: No  Patient reports access/transportation concerns to their pharmacy: No  Patient reports adherence concerns with their medications:  No     Diabetes:  Current medications: Humulin R  U-500 Kwikpen- 160 U with breakfast + 130 U with lunch and dinner,  Medications tried in the past: Basaglar, Glipizide , Metformin ER (GI issues at max dose), Trulicity (nausea + vomiting)  *Was taking Basaglar 100U daily- will need to get restarted at a lower dose - Gets testing supplies through [facility] for free when needed - Admits to not checking BG every day - Reports consistency with Humulin 160 U in the morning only - Most recent low was in February at 40- felt bad been getting close to 100 - On Fluconazole  every other day for preventative yeast infections- likely due to consistently high BG  - Reports Dexcom was too expensive when sent to the pharmacy  Patient denies hypoglycemic s/sx including dizziness, shakiness, sweating. Patient denies hyperglycemic symptoms including polyuria, polydipsia, polyphagia, nocturia, neuropathy, blurred vision.  Current meal patterns:  - Has been eating when she can throughout the day due to chaotic schedule  *Trying to pack  house up to move to [place], [place] by 06/19/23 for remote job transition to in-person; also had 2 part-time jobs as well   Current medication access support: [employer]   A1c of 14.0% on 01/16/23  Objective:  Lab Results  Component Value Date   HGBA1C 10.2 (H) 03/29/2019    Lab Results  Component Value Date   CREATININE 0.54 03/29/2019   BUN 7 03/29/2019   NA 136 03/29/2019   K 4.0 03/29/2019   CL 99 03/29/2019   CO2 26 03/29/2019    No results found for: CHOL, HDL, LDLCALC, LDLDIRECT, TRIG, CHOLHDL  Medications Reviewed Today     Reviewed by Breeding Aloysius HERO, RPH (Pharmacist) on 06/16/23 at 1540  Med List Status: <None>   Medication Order Taking? Sig Documenting Provider Last Dose Status Informant  aspirin  81 MG tablet [id number]  Take 81 mg by mouth at bedtime.  [provider]  Active Self  atorvastatin (LIPITOR) 10 MG tablet [id number]  Take 10 mg by mouth every evening.  [provider]  Active Self  DULoxetine  (CYMBALTA ) 60 MG capsule [id number] Yes Take 1 capsule (60 mg total) by mouth daily. Patel, Donika K, DO Taking Active   fluconazole  (DIFLUCAN ) 200 MG tablet [id number] Yes Take 1 tablet by mouth every other day. [provider] Taking Active   insulin  regular human CONCENTRATED (HUMULIN R  U-500 KWIKPEN) 500 UNIT/ML KwikPen [id number] Yes Inject into the skin. Inject 160 units with breakfast and 130 units with lunch and dinner [provider]  Active    Discontinued 06/16/23 1539   linaclotide  (LINZESS ) 145 MCG CAPS capsule [id number]  Take 1  capsule (145 mcg total) by mouth daily as needed.  Patient taking differently: Take 145 mcg by mouth daily.   Aneita Gwendlyn DASEN, MD  Active Self  losartan (COZAAR) 25 MG tablet [id number]  Take 25 mg by mouth every evening. [provider]  Active Self  Multiple Vitamins-Minerals (HAIR/SKIN/NAILS) CAPS [id number]  Take 3 capsules by mouth every evening. [provider]  Active Self  naproxen  (NAPROSYN ) 500 MG tablet [id number] Yes Take 1 tablet (500 mg total) by mouth 2 (two) times daily.  Patient taking differently: Take 500 mg by mouth 2 (two) times daily. prn   Billy Asberry FALCON, PA-C Taking Active   polyethylene glycol (MIRALAX / GLYCOLAX) 17 g packet [id number]  Take 17 g by mouth daily. [provider]  Active Self  valACYclovir  (VALTREX ) 1000 MG tablet [id number] Yes Take 1 tablet (1,000 mg total) by mouth 3 (three) times daily.  Patient taking differently: Take 1,000 mg by mouth daily.   Arloa Suzen RAMAN, NP Taking Active               Assessment/Plan:   Diabetes: - Currently uncontrolled - Reviewed long term cardiovascular and renal outcomes of uncontrolled blood sugar - Reviewed goal A1c, goal fasting, and goal 2 hour post prandial glucose - Recommend to check glucose up to 3 times per day - Reports severe diarrhea with metformin ER 2000mg  prior and diarrhea/vomiting with increased doses of Trulicity- reason for stopping - Agreeable to re-trying Metformin at a lower dose    Follow Up Plan:  - Follow-up on 07/14/23 for BG follow-up - Send Rx for metformin ER 500mg - take 1 tablet by mouth with breakfast for 2 weeks, then increase to 2 tablets by mouth with breakfast daily #46 30DS- if approved by Dr. Seabron - Call pharmacy to determine why price of CGM sensors was so high- may need PA  - Freestyle Herlene is preferred Tier 2 with PA needed - Would consider adding long-acting Tresiba insulin  next and decreasing meal-time insulin  - Also reports taking Losartan and Atorvastatin, but do NOT see fill history for those, along with insulin  for awhile; will check with the pharmacy  Update from 06/21/23: [facility] PA denied due to needing to show proof of testing 5x per day over two months for coverage; Key: [id number]   Aloysius Breeding, PharmD [facility] Phone Number: [phone]

## 2023-06-19 DIAGNOSIS — L650 Telogen effluvium: Secondary | ICD-10-CM | POA: Diagnosis not present

## 2023-06-19 DIAGNOSIS — L219 Seborrheic dermatitis, unspecified: Secondary | ICD-10-CM | POA: Diagnosis not present

## 2023-06-19 DIAGNOSIS — L658 Other specified nonscarring hair loss: Secondary | ICD-10-CM | POA: Diagnosis not present

## 2023-06-22 ENCOUNTER — Other Ambulatory Visit: Payer: Self-pay | Admitting: Neurology

## 2023-07-14 ENCOUNTER — Other Ambulatory Visit: Payer: Self-pay | Admitting: Pharmacist

## 2023-07-14 NOTE — Progress Notes (Signed)
 07/14/2023 Name: [name] MRN: [id number] DOB: 07-03-1980  Chief Complaint  Patient presents with   Diabetes    [name] is a 43 y.o. year old female who presented for a telephone visit.   They were referred to the pharmacist by  [facility] DM List  for assistance in managing diabetes.    Subjective:  Care Team: Primary Care Provider: [facility] ; Next Scheduled Visit: 07/25/23 Clinical Pharmacist: Marlowe Aschoff, PharmD  Medication Access/Adherence  [phone] [address], [place] - [address] [place] [address] Phone: [phone] Fax: [phone]   Patient reports affordability concerns with their medications: No  Patient reports access/transportation concerns to their pharmacy: No  Patient reports adherence concerns with their medications:  No     Diabetes:  Current medications: Humulin R U-500 Kwikpen- 160 U with breakfast + 130 U with lunch and dinner, Metformin ER 500mg  (2 tablets) with breakfast Medications tried in the past: Basaglar, Glipizide, Metformin ER (GI issues at max dose), Trulicity (nausea + vomiting)  *Was taking Basaglar 100U daily- will need to get restarted at a lower dose - Gets testing supplies through [facility] for free when needed - Admits to not checking BG every day - Reports consistency with Humulin 160 U in the morning only - Most recent low was in February at 9- felt bad been getting close to 100 - On Fluconazole every other day for preventative yeast infections- likely due to consistently high BG   Patient denies hypoglycemic s/sx including dizziness, shakiness, sweating. Patient denies hyperglycemic symptoms including polyuria, polydipsia, polyphagia, nocturia, neuropathy, blurred vision.  Current meal patterns:  - Has been eating when she can throughout the day due to chaotic schedule  *Trying to pack house up to move to  [place], [place] by 06/19/23 for remote job transition to in-person; also had 2 part-time jobs as well   Current medication access support: [employer]EP [employer]   A1c of 14.0% on 01/16/23  Objective:  Lab Results  Component Value Date   HGBA1C 10.2 (H) 03/29/2019    Lab Results  Component Value Date   CREATININE 0.54 03/29/2019   BUN 7 03/29/2019   NA 136 03/29/2019   K 4.0 03/29/2019   CL 99 03/29/2019   CO2 26 03/29/2019    No results found for: "CHOL", "HDL", "LDLCALC", "LDLDIRECT", "TRIG", "CHOLHDL"  Medications Reviewed Today   Medications were not reviewed in this encounter       Assessment/Plan:   Diabetes: - Currently uncontrolled - Reviewed long term cardiovascular and renal outcomes of uncontrolled blood sugar - Reviewed goal A1c, goal fasting, and goal 2 hour post prandial glucose - Recommend to check glucose up to 3 times per day - Reports severe diarrhea with metformin ER 2000mg  prior and diarrhea/vomiting with increased doses of Trulicity- reason for stopping    Follow Up Plan:  - No follow-up as Dr. Azucena Cecil would prefer patient to follow with Endo and is likely switching PCP office to [facility] location soon - Continue Metformin- doing well currently - Will send MyChart message to update Dr. Azucena Cecil on Endocrinologist information - Ask about doing virtual visit with Dr. Azucena Cecil due to living in [place] now; strongly recommend seeing Endo in [place] on 08/08/23 - Would consider adding long-acting Tresiba insulin next and decreasing meal-time insulin - [facility] PA denied due to needing to show proof of testing 5x per day over two months for  coverage; Key: [id number]  - Plans to call insurance company and see how much the Josephine Igo would cost if PA approved by insurance (currently $75 for 1 month supply at the pharmacy)   Marlowe Aschoff, PharmD [facility] Phone Number: [phone]
